# Patient Record
Sex: Male | Born: 1940 | Race: White | Hispanic: No | Marital: Married | Smoking: Former smoker
Health system: Southern US, Community
[De-identification: ages and names within clinical notes are randomized; demographics above are authoritative.]

## PROBLEM LIST (undated history)

## (undated) DIAGNOSIS — K219 Gastro-esophageal reflux disease without esophagitis: Secondary | ICD-10-CM

## (undated) DIAGNOSIS — M25511 Pain in right shoulder: Secondary | ICD-10-CM

## (undated) DIAGNOSIS — Z87442 Personal history of urinary calculi: Secondary | ICD-10-CM

## (undated) DIAGNOSIS — M1990 Unspecified osteoarthritis, unspecified site: Secondary | ICD-10-CM

## (undated) DIAGNOSIS — N400 Enlarged prostate without lower urinary tract symptoms: Secondary | ICD-10-CM

## (undated) DIAGNOSIS — M25512 Pain in left shoulder: Secondary | ICD-10-CM

## (undated) DIAGNOSIS — R42 Dizziness and giddiness: Secondary | ICD-10-CM

## (undated) HISTORY — DX: Unspecified osteoarthritis, unspecified site: M19.90

## (undated) HISTORY — PX: CATARACT EXTRACTION, BILATERAL: SHX1313

## (undated) HISTORY — PX: COLONOSCOPY: SHX174

## (undated) HISTORY — DX: Benign prostatic hyperplasia without lower urinary tract symptoms: N40.0

## (undated) HISTORY — DX: Gastro-esophageal reflux disease without esophagitis: K21.9

## (undated) HISTORY — PX: OTHER SURGICAL HISTORY: SHX169

## (undated) HISTORY — DX: Dizziness and giddiness: R42

## (undated) HISTORY — DX: Pain in right shoulder: M25.511

## (undated) HISTORY — DX: Pain in left shoulder: M25.512

---

## 1999-05-17 ENCOUNTER — Encounter: Payer: Self-pay | Admitting: Family Medicine

## 1999-05-17 ENCOUNTER — Ambulatory Visit (HOSPITAL_COMMUNITY): Admission: RE | Admit: 1999-05-17 | Discharge: 1999-05-17 | Payer: Self-pay | Admitting: Family Medicine

## 2000-03-23 ENCOUNTER — Encounter: Admission: RE | Admit: 2000-03-23 | Discharge: 2000-03-23 | Payer: Self-pay | Admitting: Family Medicine

## 2000-03-23 ENCOUNTER — Encounter: Payer: Self-pay | Admitting: Family Medicine

## 2001-02-18 LAB — HM COLONOSCOPY

## 2004-03-06 ENCOUNTER — Encounter: Admission: RE | Admit: 2004-03-06 | Discharge: 2004-03-06 | Payer: Self-pay | Admitting: Family Medicine

## 2005-03-13 ENCOUNTER — Ambulatory Visit: Payer: Self-pay | Admitting: Family Medicine

## 2005-03-21 ENCOUNTER — Ambulatory Visit: Payer: Self-pay

## 2005-03-21 ENCOUNTER — Encounter: Payer: Self-pay | Admitting: Family Medicine

## 2006-07-07 ENCOUNTER — Ambulatory Visit: Payer: Self-pay | Admitting: Family Medicine

## 2006-07-14 ENCOUNTER — Ambulatory Visit: Payer: Self-pay | Admitting: Family Medicine

## 2007-01-05 ENCOUNTER — Ambulatory Visit: Payer: Self-pay | Admitting: Family Medicine

## 2007-02-18 ENCOUNTER — Ambulatory Visit: Payer: Self-pay | Admitting: Family Medicine

## 2007-02-19 LAB — CONVERTED CEMR LAB
BUN: 18 mg/dL (ref 6–23)
Basophils Absolute: 0 10*3/uL (ref 0.0–0.1)
Basophils Relative: 0 % (ref 0.0–1.0)
CO2: 29 meq/L (ref 19–32)
Calcium: 9.4 mg/dL (ref 8.4–10.5)
Chloride: 109 meq/L (ref 96–112)
Creatinine, Ser: 1 mg/dL (ref 0.4–1.5)
Eosinophils Absolute: 0.1 10*3/uL (ref 0.0–0.6)
Eosinophils Relative: 2.3 % (ref 0.0–5.0)
GFR calc Af Amer: 96 mL/min
GFR calc non Af Amer: 79 mL/min
Glucose, Bld: 87 mg/dL (ref 70–99)
HCT: 46.7 % (ref 39.0–52.0)
Hemoglobin: 15.7 g/dL (ref 13.0–17.0)
Lymphocytes Relative: 23.4 % (ref 12.0–46.0)
MCHC: 33.6 g/dL (ref 30.0–36.0)
MCV: 93.8 fL (ref 78.0–100.0)
Monocytes Absolute: 0.4 10*3/uL (ref 0.2–0.7)
Monocytes Relative: 6.6 % (ref 3.0–11.0)
Neutro Abs: 4.5 10*3/uL (ref 1.4–7.7)
Neutrophils Relative %: 67.7 % (ref 43.0–77.0)
Platelets: 259 10*3/uL (ref 150–400)
Potassium: 4.3 meq/L (ref 3.5–5.1)
RBC: 4.98 M/uL (ref 4.22–5.81)
RDW: 12.5 % (ref 11.5–14.6)
Sodium: 144 meq/L (ref 135–145)
WBC: 6.5 10*3/uL (ref 4.5–10.5)

## 2008-01-19 ENCOUNTER — Ambulatory Visit: Payer: Self-pay | Admitting: Family Medicine

## 2008-01-19 DIAGNOSIS — M129 Arthropathy, unspecified: Secondary | ICD-10-CM

## 2008-01-19 DIAGNOSIS — R42 Dizziness and giddiness: Secondary | ICD-10-CM | POA: Insufficient documentation

## 2008-03-01 ENCOUNTER — Ambulatory Visit: Payer: Self-pay | Admitting: Family Medicine

## 2008-03-01 DIAGNOSIS — K219 Gastro-esophageal reflux disease without esophagitis: Secondary | ICD-10-CM | POA: Insufficient documentation

## 2008-03-08 ENCOUNTER — Ambulatory Visit: Payer: Self-pay | Admitting: Family Medicine

## 2008-08-10 ENCOUNTER — Ambulatory Visit: Payer: Self-pay | Admitting: Family Medicine

## 2008-08-10 LAB — CONVERTED CEMR LAB
Bilirubin Urine: NEGATIVE
Blood in Urine, dipstick: NEGATIVE
Glucose, Urine, Semiquant: NEGATIVE
Ketones, urine, test strip: NEGATIVE
Nitrite: NEGATIVE
Protein, U semiquant: NEGATIVE
Specific Gravity, Urine: 1.02
Urobilinogen, UA: 1
WBC Urine, dipstick: NEGATIVE
pH: 7.5

## 2008-08-17 ENCOUNTER — Ambulatory Visit: Payer: Self-pay | Admitting: Family Medicine

## 2008-08-17 DIAGNOSIS — R351 Nocturia: Secondary | ICD-10-CM

## 2008-08-17 DIAGNOSIS — R3915 Urgency of urination: Secondary | ICD-10-CM

## 2008-08-17 DIAGNOSIS — I10 Essential (primary) hypertension: Secondary | ICD-10-CM

## 2008-08-17 DIAGNOSIS — E669 Obesity, unspecified: Secondary | ICD-10-CM

## 2008-08-17 LAB — CONVERTED CEMR LAB
ALT: 25 units/L (ref 0–53)
AST: 22 units/L (ref 0–37)
Albumin: 4 g/dL (ref 3.5–5.2)
Alkaline Phosphatase: 49 units/L (ref 39–117)
BUN: 16 mg/dL (ref 6–23)
Basophils Absolute: 0 10*3/uL (ref 0.0–0.1)
Basophils Relative: 0.2 % (ref 0.0–3.0)
Bilirubin, Direct: 0.1 mg/dL (ref 0.0–0.3)
CO2: 31 meq/L (ref 19–32)
Calcium: 9.3 mg/dL (ref 8.4–10.5)
Chloride: 103 meq/L (ref 96–112)
Cholesterol: 185 mg/dL (ref 0–200)
Creatinine, Ser: 0.8 mg/dL (ref 0.4–1.5)
Eosinophils Absolute: 0.2 10*3/uL (ref 0.0–0.7)
Eosinophils Relative: 2.7 % (ref 0.0–5.0)
GFR calc Af Amer: 124 mL/min
GFR calc non Af Amer: 102 mL/min
Glucose, Bld: 95 mg/dL (ref 70–99)
HCT: 44.6 % (ref 39.0–52.0)
HDL: 40.5 mg/dL (ref >39.0)
Hemoglobin: 15.6 g/dL (ref 13.0–17.0)
LDL Cholesterol: 123 mg/dL — ABNORMAL HIGH (ref 0–99)
Lymphocytes Relative: 18.2 % (ref 12.0–46.0)
MCHC: 35 g/dL (ref 30.0–36.0)
MCV: 94 fL (ref 78.0–100.0)
Monocytes Absolute: 0.4 10*3/uL (ref 0.1–1.0)
Monocytes Relative: 6.2 % (ref 3.0–12.0)
Neutro Abs: 4.3 10*3/uL (ref 1.4–7.7)
Neutrophils Relative %: 72.7 % (ref 43.0–77.0)
PSA: 2.11 ng/mL (ref 0.10–4.00)
Platelets: 239 10*3/uL (ref 150–400)
Potassium: 3.8 meq/L (ref 3.5–5.1)
RBC: 4.75 M/uL (ref 4.22–5.81)
RDW: 11.8 % (ref 11.5–14.6)
Sodium: 142 meq/L (ref 135–145)
TSH: 1.12 microintl units/mL (ref 0.35–5.50)
Total Bilirubin: 0.9 mg/dL (ref 0.3–1.2)
Total CHOL/HDL Ratio: 4.6
Total Protein: 7 g/dL (ref 6.0–8.3)
Triglycerides: 107 mg/dL (ref 0–149)
VLDL: 21 mg/dL (ref 0–40)
WBC: 6 10*3/uL (ref 4.5–10.5)

## 2008-08-23 ENCOUNTER — Telehealth: Payer: Self-pay | Admitting: Family Medicine

## 2008-09-05 ENCOUNTER — Ambulatory Visit: Payer: Self-pay | Admitting: Internal Medicine

## 2008-09-05 ENCOUNTER — Encounter: Payer: Self-pay | Admitting: Family Medicine

## 2009-03-30 ENCOUNTER — Encounter: Payer: Self-pay | Admitting: Family Medicine

## 2009-10-23 ENCOUNTER — Ambulatory Visit: Payer: Self-pay | Admitting: Family Medicine

## 2009-10-30 ENCOUNTER — Ambulatory Visit: Payer: Self-pay | Admitting: Family Medicine

## 2009-10-30 DIAGNOSIS — F528 Other sexual dysfunction not due to a substance or known physiological condition: Secondary | ICD-10-CM

## 2009-10-30 DIAGNOSIS — Z87891 Personal history of nicotine dependence: Secondary | ICD-10-CM

## 2009-11-01 LAB — CONVERTED CEMR LAB
ALT: 25 units/L (ref 0–53)
AST: 25 units/L (ref 0–37)
Albumin: 4.1 g/dL (ref 3.5–5.2)
Alkaline Phosphatase: 44 units/L (ref 39–117)
BUN: 17 mg/dL (ref 6–23)
Basophils Absolute: 0 10*3/uL (ref 0.0–0.1)
Basophils Relative: 0.4 % (ref 0.0–3.0)
Bilirubin Urine: NEGATIVE
Bilirubin, Direct: 0 mg/dL (ref 0.0–0.3)
Blood in Urine, dipstick: NEGATIVE
CO2: 33 meq/L — ABNORMAL HIGH (ref 19–32)
Calcium: 9.4 mg/dL (ref 8.4–10.5)
Chloride: 105 meq/L (ref 96–112)
Cholesterol: 213 mg/dL — ABNORMAL HIGH (ref 0–200)
Creatinine, Ser: 0.9 mg/dL (ref 0.4–1.5)
Direct LDL: 141.2 mg/dL
Eosinophils Absolute: 0.1 10*3/uL (ref 0.0–0.7)
Eosinophils Relative: 1.7 % (ref 0.0–5.0)
GFR calc non Af Amer: 88.8 mL/min (ref >60)
Glucose, Bld: 96 mg/dL (ref 70–99)
Glucose, Urine, Semiquant: NEGATIVE
HCT: 45.1 % (ref 39.0–52.0)
HDL: 62 mg/dL (ref >39.00)
Hemoglobin: 15.1 g/dL (ref 13.0–17.0)
Ketones, urine, test strip: NEGATIVE
Lymphocytes Relative: 18.1 % (ref 12.0–46.0)
Lymphs Abs: 1.2 10*3/uL (ref 0.7–4.0)
MCHC: 33.6 g/dL (ref 30.0–36.0)
MCV: 98.8 fL (ref 78.0–100.0)
Monocytes Absolute: 0.5 10*3/uL (ref 0.1–1.0)
Monocytes Relative: 7.1 % (ref 3.0–12.0)
Neutro Abs: 4.9 10*3/uL (ref 1.4–7.7)
Neutrophils Relative %: 72.7 % (ref 43.0–77.0)
Nitrite: NEGATIVE
PSA: 2.37 ng/mL (ref 0.10–4.00)
Platelets: 184 10*3/uL (ref 150.0–400.0)
Potassium: 4.1 meq/L (ref 3.5–5.1)
Protein, U semiquant: NEGATIVE
RBC: 4.56 M/uL (ref 4.22–5.81)
RDW: 13.1 % (ref 11.5–14.6)
Sodium: 143 meq/L (ref 135–145)
Specific Gravity, Urine: 1.015
TSH: 1.37 microintl units/mL (ref 0.35–5.50)
Total Bilirubin: 1.1 mg/dL (ref 0.3–1.2)
Total CHOL/HDL Ratio: 3
Total Protein: 7.2 g/dL (ref 6.0–8.3)
Triglycerides: 93 mg/dL (ref 0.0–149.0)
Urobilinogen, UA: 1
VLDL: 18.6 mg/dL (ref 0.0–40.0)
WBC Urine, dipstick: NEGATIVE
WBC: 6.7 10*3/uL (ref 4.5–10.5)
pH: 7.5

## 2010-05-14 ENCOUNTER — Encounter: Payer: Self-pay | Admitting: Family Medicine

## 2010-05-15 ENCOUNTER — Ambulatory Visit: Payer: Self-pay | Admitting: Family Medicine

## 2010-05-15 DIAGNOSIS — Z87442 Personal history of urinary calculi: Secondary | ICD-10-CM | POA: Insufficient documentation

## 2010-05-15 LAB — CONVERTED CEMR LAB
Bilirubin Urine: NEGATIVE
Blood in Urine, dipstick: NEGATIVE
Glucose, Urine, Semiquant: NEGATIVE
Ketones, urine, test strip: NEGATIVE
Nitrite: NEGATIVE
Protein, U semiquant: NEGATIVE
Specific Gravity, Urine: 1.02
Urobilinogen, UA: 1
WBC Urine, dipstick: NEGATIVE
pH: 7

## 2010-06-10 ENCOUNTER — Telehealth: Payer: Self-pay | Admitting: Family Medicine

## 2010-08-13 ENCOUNTER — Encounter: Payer: Self-pay | Admitting: Family Medicine

## 2010-10-24 ENCOUNTER — Ambulatory Visit: Payer: Self-pay | Admitting: Family Medicine

## 2010-10-29 ENCOUNTER — Encounter: Payer: Self-pay | Admitting: Family Medicine

## 2010-10-29 DIAGNOSIS — J020 Streptococcal pharyngitis: Secondary | ICD-10-CM

## 2010-10-29 DIAGNOSIS — E291 Testicular hypofunction: Secondary | ICD-10-CM | POA: Insufficient documentation

## 2010-10-29 LAB — CONVERTED CEMR LAB
ALT: 29 units/L (ref 0–53)
Alkaline Phosphatase: 47 units/L (ref 39–117)
BUN: 18 mg/dL (ref 6–23)
Basophils Relative: 0.6 % (ref 0.0–3.0)
Bilirubin, Direct: 0.1 mg/dL (ref 0.0–0.3)
Calcium: 9.3 mg/dL (ref 8.4–10.5)
Cholesterol: 197 mg/dL (ref 0–200)
Creatinine, Ser: 0.8 mg/dL (ref 0.4–1.5)
Eosinophils Relative: 1.2 % (ref 0.0–5.0)
GFR calc non Af Amer: 102.92 mL/min (ref >60.00)
HDL: 49.7 mg/dL (ref >39.00)
Ketones, urine, test strip: NEGATIVE
LDL Cholesterol: 129 mg/dL — ABNORMAL HIGH (ref 0–99)
Lymphocytes Relative: 17.9 % (ref 12.0–46.0)
Monocytes Absolute: 0.5 10*3/uL (ref 0.1–1.0)
Neutrophils Relative %: 73.2 % (ref 43.0–77.0)
Nitrite: NEGATIVE
PSA: 2.07 ng/mL (ref 0.10–4.00)
Platelets: 186 10*3/uL (ref 150.0–400.0)
Protein, U semiquant: NEGATIVE
RBC: 4.59 M/uL (ref 4.22–5.81)
Total Bilirubin: 0.6 mg/dL (ref 0.3–1.2)
Total CHOL/HDL Ratio: 4
Total Protein: 6.5 g/dL (ref 6.0–8.3)
Triglycerides: 93 mg/dL (ref 0.0–149.0)
Urobilinogen, UA: 0.2
VLDL: 18.6 mg/dL (ref 0.0–40.0)
WBC: 6.4 10*3/uL (ref 4.5–10.5)

## 2010-10-31 ENCOUNTER — Ambulatory Visit: Payer: Self-pay | Admitting: Family Medicine

## 2010-11-05 LAB — CONVERTED CEMR LAB: Testosterone: 180.23 ng/dL — ABNORMAL LOW (ref 350.00–890.00)

## 2010-12-10 NOTE — Assessment & Plan Note (Signed)
Summary: KIDNEY STONE? // RS   Vital Signs:  Patient profile:   70 year old male Weight:      219 pounds O2 Sat:      94 % Temp:     98.1 degrees F Pulse rate:   90 / minute Pulse rhythm:   regular BP sitting:   130 / 80  (left arm) Cuff size:   regular  Vitals Entered By: Pura Spice, RN (May 15, 2010 3:56 PM) CC: ? need colonscopy and states had kidney stone 2 wks ago    History of Present Illness: 70 year old male is in full urinalysis following episode of renal colic 2 weeks ago and was instructed to come in for a urinalysis. He is having no pain no dysuria no urgency in doing fine Also note is we need to check on his colonoscopic exam which was done by Dr. Victorino Dike in the past main complaint today is that of arthritis of the right hip   Allergies: No Known Drug Allergies  Past History:  Risk Factors: Smoking Status: quit (10/30/2009) Packs/Day: 0.5 (10/30/2009)  Past Medical History: BPH Dizziness or vertigo renal calculus  Past Surgical History: repair Achilles tendon on the right  Review of Systems      See HPI  The patient denies anorexia, fever, weight loss, weight gain, vision loss, decreased hearing, hoarseness, chest pain, syncope, dyspnea on exertion, peripheral edema, prolonged cough, headaches, hemoptysis, abdominal pain, melena, hematochezia, severe indigestion/heartburn, hematuria, incontinence, genital sores, muscle weakness, suspicious skin lesions, transient blindness, difficulty walking, depression, unusual weight change, abnormal bleeding, enlarged lymph nodes, angioedema, breast masses, and testicular masses.    Physical Exam  General:  Well-developed,well-nourished,in no acute distress; alert,appropriate and cooperative throughout examinationoverweight-appearing.   Lungs:  Normal respiratory effort, chest expands symmetrically. Lungs are clear to auscultation, no crackles or wheezes. Heart:  Normal rate and regular rhythm. S1 and S2 normal  without gallop, murmur, click, rub or other extra sounds. Abdomen:  Bowel sounds positive,abdomen soft and non-tender without masses, organomegaly or hernias noted.no CVA tenderness Msk:  tenderness over the right hip just above the insertion of the Achilles tendon on the right there is increased induration from the surgery no tenderness Extremities:  No clubbing, cyanosis, edema, or deformity noted with normal full range of motion of all joints.     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 1 once daily  Complete Medication List: 1)  Nexium 40 Mg Cpdr (Esomeprazole magnesium) .Marland Kitchen.. 1 once daily 2)  Zipcor 25 Mg  .Marland Kitchen.. 1 qid for arthritic pain 3)  Peridex 0.12 % Soln (Chlorhexidine gluconate) .Marland Kitchen.. 1  tsp qid, rinse and spit out 4)  Hydrocodone-homatropine 5-1.5 Mg/59ml Syrp (Hydrocodone-homatropine) .Marland Kitchen.. 1 or 2 tsp evry 4-6 hrs as needed cough 5)  Diclofenac Sodium 75 Mg Tbec (Diclofenac sodium) .Marland Kitchen.. 1 tablet after meals, for inflammation anrthritis 6)  Rapaflo 8 Mg Caps (Silodosin) .Marland Kitchen.. 1 qd 7)  Phenteramine 3.75  .Marland Kitchen.. 1 qam to decrease appetite  Patient Instructions: 1)  urinalysis negative 2)  The colonoscopic examination 2012 3)  Continue  her regular medications diclofenac for arthritis Prescriptions: PHENTERAMINE 3.75 1 qAM to decrease appetite  #30 x 2   Entered and Authorized by:   Judithann Sheen MD   Signed by:   Judithann Sheen MD on 05/15/2010   Method used:   Print then Give to Patient   RxID:    [id number]   Prevention & Chronic Care Immunizations   Influenza vaccine: Not documented    Tetanus booster: 07/11/2006: Td   Tetanus booster due: 07/11/2016    Pneumococcal vaccine: Pneumovax  (08/17/2008)   Pneumococcal vaccine due: None    H. zoster vaccine: Not documented  Colorectal Screening   Hemoccult: Not documented    Colonoscopy: Normal  (02/18/2001)   Colonoscopy due: 02/2011  Other Screening   PSA: 2.37  (10/23/2009)   PSA due due: 10/23/2010   Smoking status: quit  (10/30/2009)  Lipids   Total Cholesterol: 213  (10/23/2009)   LDL: 123  (08/10/2008)   LDL Direct: 141.2  (10/23/2009)   HDL: 62.00  (10/23/2009)   Triglycerides: 93.0  (10/23/2009)  Hypertension   Last Blood Pressure: 130 / 80  (05/15/2010)   Serum creatinine: 0.9  (10/23/2009)   Serum potassium 4.1  (10/23/2009)  Self-Management Support :    Hypertension self-management support: Not documented    Laboratory Results   Urine Tests    Routine Urinalysis   Color: yellow Appearance: Clear Glucose: negative   (Normal Range: Negative) Bilirubin: negative   (Normal Range: Negative) Ketone: negative   (Normal Range: Negative) Spec. Gravity: 1.020   (Normal Range: 1.003-1.035) Blood: negative   (Normal Range: Negative) pH: 7.0   (Normal Range: 5.0-8.0) Protein: negative   (Normal Range: Negative) Urobilinogen: 1.0   (Normal Range: 0-1) Nitrite: negative   (Normal Range: Negative) Leukocyte Esterace: negative   (Normal Range: Negative)    Comments: Rita Ohara  May 15, 2010 4:48 PM

## 2010-12-10 NOTE — Letter (Signed)
Summary: [facility]  [facility]   Imported By: Maryln Gottron 07/09/2010 15:07:57  _____________________________________________________________________  External Attachment:    Type:   Image     Comment:   External Document

## 2010-12-10 NOTE — Letter (Signed)
Summary: [facility]  [facility]   Imported By: Maryln Gottron 08/21/2010 13:47:43  _____________________________________________________________________  External Attachment:    Type:   Image     Comment:   External Document

## 2010-12-10 NOTE — Progress Notes (Signed)
Summary: colonscopy  Phone Note Call from Patient   Caller: Spouse Call For: Judithann Sheen MD Summary of Call: Requesting order for colonoscopy......Marland KitchenHas and was told that he cannot have one until 2013 due to his insurance coverage. [phone] Initial call taken by: Lynann Beaver CMA,  June 10, 2010 2:55 PM  Follow-up for Phone Call        does not need exam, notified Follow-up by: Judithann Sheen MD,  June 20, 2010 6:49 PM

## 2010-12-12 NOTE — Assessment & Plan Note (Signed)
Summary: cpx/njr   Vital Signs:  Patient profile:   70 year old male Height:      66 inches Weight:      215 pounds BMI:     34.83 O2 Sat:      96 % Temp:     97.6 degrees F Pulse rate:   88 / minute Pulse rhythm:   regular BP sitting:   146 / 90  (left arm) Cuff size:   large  Vitals Entered By: Pura Spice, RN (October 29, 2010 10:06 AM) CC: go over meds discuss labs  Is Patient Diabetic? No    History of Present Illness: repeat BP 90/37 This 70 year old white male from [place] with an American citizen for many years and is now retired from [employer]. He is in today to discuss his medical problems, discussed his laboratory studies and go over his medications and get necessary refill He has a problem with nocturia of 3-10 control for her well with rapaflo but desires to change to Flomax since it is generic he does have some urinary frequency and urgency since his last visit Curtis control with Nexium Continues to have some arthritis and his problem with his Achilles tendon has been resolved with her great by Dr. Lestine Box Patient continues to have problem with weight gain and realizes he needs to lose some weight However in general he feels very well energetic sleeps relatively well Blood pressure is well controlled complaint of sore throat over the past 2-3 days patient has had erectile dysfunction in the past but has increased in severity along with some increased fatigability and in her discussion and decided to check his testosterone level      Allergies: No Known Drug Allergies  Past History:  Past Medical History: Last updated: 05/15/2010 BPH Dizziness or vertigo renal calculus  Past Surgical History: Last updated: 05/15/2010 repair Achilles tendon on the right  Risk Factors: Smoking Status: quit (10/30/2009) Packs/Day: 0.5 (10/30/2009)  Review of Systems  The patient denies anorexia, fever, weight loss, weight gain, vision loss, decreased  hearing, hoarseness, chest pain, syncope, dyspnea on exertion, peripheral edema, prolonged cough, headaches, hemoptysis, abdominal pain, melena, hematochezia, severe indigestion/heartburn, hematuria, incontinence, genital sores, muscle weakness, suspicious skin lesions, transient blindness, difficulty walking, depression, unusual weight change, abnormal bleeding, enlarged lymph nodes, angioedema, breast masses, and testicular masses.   Eyes:  Denies blurring, discharge, double vision, eye irritation, eye pain, halos, itching, light sensitivity, red eye, vision loss-1 eye, and vision loss-both eyes. ENT:  Denies decreased hearing, difficulty swallowing, ear discharge, earache, hoarseness, nasal congestion, nosebleeds, postnasal drainage, ringing in ears, sinus pressure, and sore throat. CV:  Denies bluish discoloration of lips or nails, chest pain or discomfort, difficulty breathing at night, difficulty breathing while lying down, fainting, fatigue, leg cramps with exertion, lightheadness, near fainting, palpitations, shortness of breath with exertion, swelling of feet, swelling of hands, and weight gain. Resp:  Denies chest discomfort, chest pain with inspiration, cough, coughing up blood, excessive snoring, hypersomnolence, morning headaches, pleuritic, shortness of breath, sputum productive, and wheezing. GI:  Complains of diarrhea and indigestion; ged afyer over eating, also indigstion afterovereating and spicey food. GU:  See HPI; Denies decreased libido, discharge, dysuria, erectile dysfunction, genital sores, hematuria, incontinence, nocturia, urinary frequency, and urinary hesitancy. Neuro:  See HPI; Denies brief paralysis, difficulty with concentration, disturbances in coordination, falling down, headaches, inability to speak, memory loss, numbness, poor balance, seizures, sensation of room spinning, tingling, tremors, visual disturbances, and weakness. Psych:  Denies alternate hallucination (  auditory/visual), anxiety, depression, easily angered, easily tearful, irritability, mental problems, panic attacks, sense of great danger, suicidal thoughts/plans, thoughts of violence, unusual visions or sounds, and thoughts /plans of harming others.  Physical Exam  General:  Well-developed,well-nourished,in no acute distress; alert,appropriate and cooperative throughout examinationoverweight-appearing.   Head:  Normocephalic and atraumatic without obvious abnormalities. No apparent alopecia or balding. Eyes:  No corneal or conjunctival inflammation noted. EOMI. Perrla. Funduscopic exam benign, without hemorrhages, exudates or papilledema. Vision grossly normal. Ears:  External ear exam shows no significant lesions or deformities.  Otoscopic examination reveals clear canals, tympanic membranes are intact bilaterally without bulging, retraction, inflammation or discharge. Hearing is grossly normal bilaterally. Nose:  External nasal examination shows no deformity or inflammation. Nasal mucosa are pink and moist without lesions or exudates. Mouth:  Oral mucosa and oropharynx without lesions or exudates.  Teeth in good repair. Neck:  No deformities, masses, or tenderness noted. Chest Wall:  No deformities, masses, tenderness or gynecomastia noted. Breasts:  No masses or gynecomastia noted Lungs:  Normal respiratory effort, chest expands symmetrically. Lungs are clear to auscultation, no crackles or wheezes. Heart:  Normal rate and regular rhythm. S1 and S2 normal without gallop, murmur, click, rub or other extra sounds. Abdomen:  Bowel sounds positive,abdomen soft and non-tender without masses, organomegaly or hernias noted. Rectal:  No external abnormalities noted. Normal sphincter tone. No rectal masses or tenderness. Genitalia:  Testes bilaterally descended without nodularity, tenderness or masses. No scrotal masses or lesions. No penis lesions or urethral discharge. Prostate:  1+ enlarged.     Msk:  well-healed scar above the right ankle of the Achilles tendon minimal stiffness of right ankle Pulses:  R and L carotid,radial,femoral,dorsalis pedis and posterior tibial pulses are full and equal bilaterally Extremities:  No clubbing, cyanosis, edema, or deformity noted with normal full range of motion of all joints.   Neurologic:  No cranial nerve deficits noted. Station and gait are normal. Plantar reflexes are down-going bilaterally. DTRs are symmetrical throughout. Sensory, motor and coordinative functions appear intact. Skin:  Intact without suspicious lesions or rashes Cervical Nodes:  No lymphadenopathy noted Axillary Nodes:  No palpable lymphadenopathy Inguinal Nodes:  No significant adenopathy Psych:  Cognition and judgment appear intact. Alert and cooperative with normal attention span and concentration. No apparent delusions, illusions, hallucinations   Impression & Recommendations:  Problem # 1:  STREP THROAT (ICD-034.0) Assessment New  The following medications were removed from the medication list:    Peridex 0.12 % Soln (Chlorhexidine gluconate) .Marland Kitchen... 1  tsp qid, rinse and spit out His updated medication list for this problem includes:    Diclofenac Sodium 75 Mg Tbec (Diclofenac sodium) .Marland Kitchen... 1 tablet after meals, for inflammation anrthritis    Ciprofloxacin Hcl 500 Mg Tabs (Ciprofloxacin hcl) .Marland Kitchen... 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  Problem # 7:  NOCTURIA (ELF-810.17) Assessment: Unchanged Flomax 4 mg q. daily  Problem #  8:  GERD (ICD-530.81) Assessment: Improved  His updated medication list for this problem includes:    Nexium 40 Mg Cpdr (Esomeprazole magnesium) .Marland Kitchen... 1 once daily  Complete Medication List: 1)  Nexium 40 Mg Cpdr (Esomeprazole magnesium) .Marland Kitchen.. 1 once daily 2)  Zipcor 25 Mg  .Marland Kitchen.. 1 qid for arthritic pain 3)  Diclofenac Sodium 75 Mg Tbec (Diclofenac sodium) .Marland Kitchen.. 1 tablet after meals, for inflammation anrthritis 4)  Rapaflo 8 Mg Caps (Silodosin) .Marland Kitchen.. 1 qd 5)  Ciprofloxacin Hcl 500 Mg Tabs (Ciprofloxacin hcl) .Marland Kitchen.. 1 once daily to prevent diarrhea, if you have diarrhea then take 1 two times a day for  7 days 6)  Prochlorperazine Maleate 10 Mg Tabs (Prochlorperazine maleate) .Marland Kitchen.. 1 stat then 1 q6h t0 prevent nausea and vomiting 7)  Flomax 0.4 Mg Caps (Tamsulosin hcl) .Marland Kitchen.. 1 once daily to prevent night time urination 8)  Androgel Pump 20.25 Mg/act (1.62%) Gel (Testosterone) .... Apply to shoulders daily 9)  Vesicare 5 Mg Tabs (Solifenacin succinate) .Marland Kitchen.. 1 once daily to prevent urgency  Other Orders: EKG w/ Interpretation (93000)  Patient Instructions: 1)  in general he is doing well 2)  You need to lose weight. Consider a lower calorie diet and regular exercise.  3)  have checked test postural level 4)   and presented it is low we'll treat you with a digital blockRefill of the medication Prescriptions: ANDROGEL PUMP 20.25 MG/ACT (1.62%) GEL (TESTOSTERONE) apply to shoulders daily  #5 gm x 5   Entered and Authorized by:   Judithann Sheen MD   Signed by:   Judithann Sheen MD on 10/29/2010   Method used:   Print then Give to Patient   RxID:   [id number] FLOMAX 0.4 MG CAPS (TAMSULOSIN HCL) 1 once daily to prevent night time urination  #30 x 11   Entered and Authorized by:   Judithann Sheen MD   Signed by:   Judithann Sheen MD on 10/29/2010   Method used:   Electronically to        [facility]  [phone]* (retail)       [address] [place], [place]  [address]       Ph: [phone] or [phone]       Fax: [phone]   RxID:   [id number] DICLOFENAC SODIUM 75 MG TBEC (DICLOFENAC SODIUM) 1 tablet after meals, for inflammation anrthritis  #60 x 11   Entered and Authorized by:   Judithann Sheen MD   Signed by:   Judithann Sheen MD on 10/29/2010   Method used:   Electronically to        [facility]  [phone]* (retail)       [address] [place], [place]  [address]       Ph: [phone] or [phone]       Fax: [phone]   RxID:   [id number] PROCHLORPERAZINE MALEATE 10 MG TABS (PROCHLORPERAZINE MALEATE) 1 stat then 1 q6h t0 prevent nausea and vomiting  #50 x 1   Entered and Authorized by:   Judithann Sheen MD   Signed by:   Judithann Sheen MD on 10/29/2010   Method used:   Electronically to        [facility]  [phone]* (retail)       [address] [place]  [place], [place]  [address]       Ph: [phone] or [phone]       Fax: [phone]   RxID:   [id number] CIPROFLOXACIN HCL 500 MG TABS (CIPROFLOXACIN HCL) 1 once daily to prevent diarrhea, if you have diarrhea then take 1 two times a day for  7 days  #44 x 0   Entered and Authorized by:   Judithann Sheen MD   Signed by:   Judithann Sheen MD on 10/29/2010   Method used:   Electronically to        [facility]  [phone]* (retail)       [address] [place], [place]  [address]       Ph: [phone] or [phone]       Fax: [phone]   RxID:   [id number]    Orders Added: 1)  EKG w/ Interpretation [93000] 2)  TLB-Testosterone, Total [84403-TESTO] 3)  Prescription Created Electronically [G8553] 4)  Est. Patient Level IV [85462]

## 2011-01-18 ENCOUNTER — Encounter: Payer: Self-pay | Admitting: Family Medicine

## 2011-01-28 ENCOUNTER — Encounter: Payer: Self-pay | Admitting: Family Medicine

## 2011-01-28 ENCOUNTER — Ambulatory Visit (INDEPENDENT_AMBULATORY_CARE_PROVIDER_SITE_OTHER): Payer: Medicare Other | Admitting: Family Medicine

## 2011-01-28 VITALS — BP 122/82 | HR 121 | Temp 98.5°F | Ht 65.0 in | Wt 220.0 lb

## 2011-01-28 DIAGNOSIS — R05 Cough: Secondary | ICD-10-CM

## 2011-01-28 DIAGNOSIS — N419 Inflammatory disease of prostate, unspecified: Secondary | ICD-10-CM

## 2011-01-28 DIAGNOSIS — J40 Bronchitis, not specified as acute or chronic: Secondary | ICD-10-CM

## 2011-01-28 DIAGNOSIS — M1990 Unspecified osteoarthritis, unspecified site: Secondary | ICD-10-CM

## 2011-01-28 DIAGNOSIS — M129 Arthropathy, unspecified: Secondary | ICD-10-CM

## 2011-01-28 DIAGNOSIS — R059 Cough, unspecified: Secondary | ICD-10-CM

## 2011-01-28 DIAGNOSIS — N400 Enlarged prostate without lower urinary tract symptoms: Secondary | ICD-10-CM

## 2011-01-28 LAB — POCT URINALYSIS DIPSTICK
Blood, UA: NEGATIVE
Glucose, UA: NEGATIVE
Nitrite, UA: NEGATIVE
Urobilinogen, UA: 0.2

## 2011-01-28 MED ORDER — METHYLPREDNISOLONE ACETATE 80 MG/ML IJ SUSP
120.0000 mg | Freq: Once | INTRAMUSCULAR | Status: AC
  Administered 2011-01-28: 120 mg via INTRAMUSCULAR

## 2011-01-28 MED ORDER — TRAMADOL HCL 50 MG PO TABS: 50.0000 mg | ORAL_TABLET | Freq: Four times a day (QID) | ORAL | Status: AC | PRN

## 2011-01-28 MED ORDER — LEVOFLOXACIN 500 MG PO TABS: 500.0000 mg | ORAL_TABLET | Freq: Every day | ORAL | Status: AC

## 2011-01-29 LAB — CBC WITH DIFFERENTIAL/PLATELET
Basophils Relative: 0.2 % (ref 0.0–3.0)
Eosinophils Relative: 1.2 % (ref 0.0–5.0)
HCT: 42.5 % (ref 39.0–52.0)
Lymphs Abs: 1.7 10*3/uL (ref 0.7–4.0)
MCV: 96.4 fl (ref 78.0–100.0)
Monocytes Absolute: 0.5 10*3/uL (ref 0.1–1.0)
Monocytes Relative: 6.1 % (ref 3.0–12.0)
RBC: 4.41 Mil/uL (ref 4.22–5.81)
WBC: 9 10*3/uL (ref 4.5–10.5)

## 2011-01-30 ENCOUNTER — Ambulatory Visit (INDEPENDENT_AMBULATORY_CARE_PROVIDER_SITE_OTHER)
Admission: RE | Admit: 2011-01-30 | Discharge: 2011-01-30 | Disposition: A | Discharge status: Home / Self Care | Payer: Medicare Other | Source: Ambulatory Visit | Attending: Family Medicine | Admitting: Family Medicine

## 2011-01-30 DIAGNOSIS — J40 Bronchitis, not specified as acute or chronic: Secondary | ICD-10-CM

## 2011-02-02 ENCOUNTER — Encounter: Payer: Self-pay | Admitting: Family Medicine

## 2011-02-02 NOTE — Progress Notes (Signed)
  Subjective:    Patient ID: Paul Schwartz, male    DOB: [birth year], 70 y.o.   MRN: [id number] This 70 year old male from [place] is in to discuss the fact he has had a cough while in vacation in the coughing is persisted for approximately 1 month nonproductive in fact had bronchitis 07 November 2010 he has continued to have back pain for months despite diclofenac and the pain radiates down her right leg also some muscle spasm His main compliant is that of urinary frequency small amounts of urine as well as decrease in flow and nocturia 3-4 times per night he has been on Rapoflol which has not helped at this time HPI    Review of Systemssee history of present illness    Objective:   Physical Exam the patient is a well-developed well-nourished obese male who does not appear to be any need to stress is not short of breath nor cough and at this time HEENT revealed minimal nasal congestion Lungs minimal rhonchi at the bases minimal expiratory wheeze no dullness to percussion Heart examination no cardiomegaly heart sounds are good without murmurs regular rhythm peripheral pulses and good and equal bilaterally Examination of the back reveals tenderness over the lumbar spine as well as right SI joint as well as slight muscle spasm over the right lumbar region rectal examination reveals prostate to be enlarged x2 boggy and very tender no nodules Operative scar on the right ankle posterior       Assessment & Plan:  Chronic cough to obtain chest x-ray as well as CBC to evaluate infection Lumbosacral arthritis as well as strain to treat with Depo-Medrol 120 mg im,Continue diclofenac b.i.d. Prostatitis to treat with Cipro 500 mg b.i.d. And return for examination

## 2011-02-02 NOTE — Patient Instructions (Signed)
A fan the reason for your urinary frequency change in flow and increased nocturia D2 enlarged prostate as well as evidence of prostatitis Since you have had bronchitis for some time which is clear except for the cough will get chest x-ray as well as a complete blood count Back pain persist with arthritis and have given you Depo-Medrol 120 mg IM continue the diclofenac

## 2011-02-18 ENCOUNTER — Ambulatory Visit (INDEPENDENT_AMBULATORY_CARE_PROVIDER_SITE_OTHER): Payer: Medicare Other | Admitting: Family Medicine

## 2011-02-18 VITALS — BP 130/76 | HR 107 | Temp 98.0°F | Ht 66.0 in | Wt 218.0 lb

## 2011-02-18 DIAGNOSIS — M545 Low back pain: Secondary | ICD-10-CM

## 2011-02-18 DIAGNOSIS — M129 Arthropathy, unspecified: Secondary | ICD-10-CM

## 2011-02-18 DIAGNOSIS — M1990 Unspecified osteoarthritis, unspecified site: Secondary | ICD-10-CM

## 2011-02-18 DIAGNOSIS — N419 Inflammatory disease of prostate, unspecified: Secondary | ICD-10-CM

## 2011-02-18 MED ORDER — LEVOFLOXACIN 500 MG PO TABS: 500.0000 mg | ORAL_TABLET | Freq: Every day | ORAL | Status: AC

## 2011-02-18 NOTE — Patient Instructions (Signed)
You continue to have slight residual of prostatits Refilled levaquin 500 mg each day Continue rapaflo Restart diclofenac

## 2011-02-18 NOTE — Progress Notes (Signed)
  Subjective:    Patient ID: Paul Schwartz, male    DOB: [birth year], 70 y.o.   MRN: [id number] This 70 year old [place] is in for followup for recent treatment of prostatitis as well as low back pain He is greatly improved has less urinary symptoms and has changed his medications except for rapaflo Continues to have slight low back pain. No other complaint HPI    Review of Systemssee history of present illness    Objective:   Physical Exam The patient is well-developed well-nourished man who appears to be in no distress is cooperative and pleasant Examination heart and lungs normal, no peripheral edema with Examination of the prostate reveals to be 1+ enlarged with minimal tenderness persisting       Assessment & Plan:  Acute prostatitis has greatly improved but continues to have evidence of persisting prostatitis and will refill his Levaquin Restart diclofenac 75 mg b.i.d. For arthritis and low back pain  Urinary frequency and urgency have improved continue Rapaflo

## 2011-04-08 ENCOUNTER — Encounter: Payer: Self-pay | Admitting: Gastroenterology

## 2011-04-08 ENCOUNTER — Ambulatory Visit (AMBULATORY_SURGERY_CENTER): Payer: Medicare Other

## 2011-04-08 VITALS — Ht 66.0 in | Wt 215.3 lb

## 2011-04-08 DIAGNOSIS — Z1211 Encounter for screening for malignant neoplasm of colon: Secondary | ICD-10-CM

## 2011-04-08 MED ORDER — PEG-KCL-NACL-NASULF-NA ASC-C 100 G PO SOLR: 1.0000 | Freq: Once | ORAL | Status: AC

## 2011-04-09 ENCOUNTER — Ambulatory Visit (INDEPENDENT_AMBULATORY_CARE_PROVIDER_SITE_OTHER): Payer: Medicare Other | Admitting: Family Medicine

## 2011-04-09 ENCOUNTER — Encounter: Payer: Self-pay | Admitting: Family Medicine

## 2011-04-09 VITALS — BP 122/78 | HR 107 | Temp 97.7°F | Wt 217.0 lb

## 2011-04-09 DIAGNOSIS — M67919 Unspecified disorder of synovium and tendon, unspecified shoulder: Secondary | ICD-10-CM

## 2011-04-09 DIAGNOSIS — M7551 Bursitis of right shoulder: Secondary | ICD-10-CM

## 2011-04-09 DIAGNOSIS — M7552 Bursitis of left shoulder: Secondary | ICD-10-CM

## 2011-04-09 MED ORDER — PREDNISONE 20 MG PO TABS: ORAL_TABLET | ORAL | Status: DC

## 2011-04-14 ENCOUNTER — Ambulatory Visit (AMBULATORY_SURGERY_CENTER): Payer: Medicare Other | Admitting: Gastroenterology

## 2011-04-14 ENCOUNTER — Encounter: Payer: Self-pay | Admitting: Gastroenterology

## 2011-04-14 VITALS — BP 158/104 | HR 75 | Resp 17 | Ht 65.0 in | Wt 202.0 lb

## 2011-04-14 DIAGNOSIS — D126 Benign neoplasm of colon, unspecified: Secondary | ICD-10-CM

## 2011-04-14 DIAGNOSIS — Z1211 Encounter for screening for malignant neoplasm of colon: Secondary | ICD-10-CM

## 2011-04-14 MED ORDER — SODIUM CHLORIDE 0.9 % IV SOLN: 500.0000 mL | INTRAVENOUS | Status: DC

## 2011-04-14 NOTE — Patient Instructions (Signed)
DISCHARGE( GREEN& BLUE SHEETS) REVIEWED WITH & GIVEN TO CAREPARTNER. INFORMATION ON POLYPS GIVEN TO CAREPARTNER.

## 2011-04-15 ENCOUNTER — Telehealth: Payer: Self-pay | Admitting: *Deleted

## 2011-04-15 NOTE — Telephone Encounter (Signed)

## 2011-04-20 MED ORDER — METHYLPREDNISOLONE ACETATE 80 MG/ML IJ SUSP: 120.0000 mg | Freq: Once | INTRAMUSCULAR | Status: DC

## 2011-04-20 NOTE — Patient Instructions (Signed)
You have a subdeltoid bursitis in both shoulders which have injected Depo-Medrol with 1% lidocaine and have prescribed prednisone 20 mg decrease in dosage until finished Continue Nexium for her

## 2011-04-20 NOTE — Progress Notes (Signed)
  Subjective:    Patient ID: Paul Schwartz, male    DOB: [birth year], 70 y.o.   MRN: [id number] This 70 year old [place] male is in complaint of pain in both shoulders which hurts on movement as well as keeping him awake at night he's had this previously and treated with diclofenac which did not help him at this time HPI    Review of Systems  Constitutional: Negative.   HENT: Negative.   Eyes: Negative.   Respiratory: Negative.   Cardiovascular: Negative.   Gastrointestinal:       GERD controlled with PPI  Genitourinary:       Nocturia  Musculoskeletal:       Pain both shoulders  Skin: Negative.   Neurological: Negative.   Hematological: Negative.   Psychiatric/Behavioral: Negative.         Objective:   Physical Exam the patient is well-developed well-nourished male who is complaining of pain in both shoulders on elevation arms Examination her shoulder revealed tenderness over both subdeltoid bursa was especially the right Heart and lungs no change       Assessment & Plan:  Acute subdeltoid bursitis bilaterally treated with injection of Depo-Medrol 120 mg +1% lidocaine as well as for treatment of prednisone on decreasing dosage

## 2011-04-21 ENCOUNTER — Encounter: Payer: Self-pay | Admitting: Gastroenterology

## 2011-06-03 ENCOUNTER — Ambulatory Visit (INDEPENDENT_AMBULATORY_CARE_PROVIDER_SITE_OTHER): Payer: Medicare Other | Admitting: Family Medicine

## 2011-06-03 ENCOUNTER — Encounter: Payer: Self-pay | Admitting: Family Medicine

## 2011-06-03 VITALS — BP 128/72 | HR 93 | Temp 98.6°F | Wt 214.0 lb

## 2011-06-03 DIAGNOSIS — M719 Bursopathy, unspecified: Secondary | ICD-10-CM

## 2011-06-03 DIAGNOSIS — M7551 Bursitis of right shoulder: Secondary | ICD-10-CM

## 2011-06-03 DIAGNOSIS — N318 Other neuromuscular dysfunction of bladder: Secondary | ICD-10-CM

## 2011-06-03 DIAGNOSIS — N400 Enlarged prostate without lower urinary tract symptoms: Secondary | ICD-10-CM

## 2011-06-03 DIAGNOSIS — N3281 Overactive bladder: Secondary | ICD-10-CM

## 2011-06-03 DIAGNOSIS — N529 Male erectile dysfunction, unspecified: Secondary | ICD-10-CM

## 2011-06-03 MED ORDER — TADALAFIL 5 MG PO TABS
5.0000 mg | ORAL_TABLET | Freq: Every day | ORAL | Status: DC | PRN
Start: 2011-06-03 — End: 2013-12-20

## 2011-06-03 MED ORDER — SOLIFENACIN SUCCINATE 10 MG PO TABS: 5.0000 mg | ORAL_TABLET | Freq: Every day | ORAL | Status: DC

## 2011-06-03 MED ORDER — LEVOFLOXACIN 500 MG PO TABS: 500.0000 mg | ORAL_TABLET | Freq: Every day | ORAL | Status: AC

## 2011-06-03 MED ORDER — PHENTERMINE HCL 37.5 MG PO CAPS: 37.5000 mg | ORAL_CAPSULE | ORAL | Status: AC

## 2011-06-03 NOTE — Patient Instructions (Addendum)
You have acromioclavicular bursitis right shoulder I injected lidocaine and Depo-Medrol in the bursa attending to take the diclofenac that you have at home twice daily With Cialis I will give you a prescription that they were filled you a one-month free [facility] [facility] Will prescribe phentermine 37.5 mg each am to decrease appetite Prostate exam  Reveals gland has not increased in size TAKE Vesicare 5 mg each day prevent urinary urgency

## 2011-06-09 ENCOUNTER — Ambulatory Visit (INDEPENDENT_AMBULATORY_CARE_PROVIDER_SITE_OTHER)
Admission: RE | Admit: 2011-06-09 | Discharge: 2011-06-09 | Disposition: A | Discharge status: Home / Self Care | Payer: Medicare Other | Source: Ambulatory Visit | Attending: Family Medicine | Admitting: Family Medicine

## 2011-06-09 DIAGNOSIS — M7551 Bursitis of right shoulder: Secondary | ICD-10-CM

## 2011-06-09 DIAGNOSIS — M719 Bursopathy, unspecified: Secondary | ICD-10-CM

## 2011-07-07 NOTE — Progress Notes (Signed)
  Subjective:    Patient ID: Jola Babinski, male    DOB: Feb 07, 1941, 70 y.o.   MRN: [id number] This 70 year old white married male has had problem with bursitis bilaterally but recently has been more severe in the right shoulder with limitation of hyperextension of the arm resulting in pain also has pain during the night. He relates he has nocturia x2 has some urinary urgency No other symptoms on systems review   HPI    Review of Systems see history of present illness     Objective:   Physical Exam patient is a well-built well-nourished male in no distress Examination of the right shoulder revealed marked tenderness of the acromioclavicular bursal with pain also will posterior movement and elevation considerable pain Also examination left shoulder has some tenderness over the a.c. Bursal Examination the prostate reveals very minimal enlargement no tenderness no nodules        Assessment & Plan:  Acromioclavicular bursitis right shoulder refire with x-ray revealing degenerative disease Treat with injection of Depo-Medrol plus lidocaine also continue diclofenac to 5 mg twice a day Nocturia and urinary urgency continue this he care plus Flomax Tramadol for pain Referral to Dr. Valma Cava

## 2011-10-20 ENCOUNTER — Ambulatory Visit (INDEPENDENT_AMBULATORY_CARE_PROVIDER_SITE_OTHER): Payer: Medicare Other

## 2011-10-20 DIAGNOSIS — Z23 Encounter for immunization: Secondary | ICD-10-CM

## 2012-01-06 ENCOUNTER — Encounter: Payer: Medicare Other | Admitting: Internal Medicine

## 2012-01-15 ENCOUNTER — Ambulatory Visit (INDEPENDENT_AMBULATORY_CARE_PROVIDER_SITE_OTHER): Payer: Medicare Other | Admitting: Internal Medicine

## 2012-01-15 ENCOUNTER — Encounter: Payer: Self-pay | Admitting: Internal Medicine

## 2012-01-15 VITALS — BP 120/82 | HR 90 | Temp 98.0°F | Resp 18 | Ht 67.0 in | Wt 211.0 lb

## 2012-01-15 DIAGNOSIS — M129 Arthropathy, unspecified: Secondary | ICD-10-CM

## 2012-01-15 DIAGNOSIS — Z136 Encounter for screening for cardiovascular disorders: Secondary | ICD-10-CM

## 2012-01-15 DIAGNOSIS — I10 Essential (primary) hypertension: Secondary | ICD-10-CM

## 2012-01-15 DIAGNOSIS — E291 Testicular hypofunction: Secondary | ICD-10-CM

## 2012-01-15 DIAGNOSIS — Z86010 Personal history of colonic polyps: Secondary | ICD-10-CM | POA: Insufficient documentation

## 2012-01-15 DIAGNOSIS — Z0000 Encounter for general adult medical examination without abnormal findings: Secondary | ICD-10-CM

## 2012-01-15 DIAGNOSIS — E669 Obesity, unspecified: Secondary | ICD-10-CM

## 2012-01-15 LAB — COMPREHENSIVE METABOLIC PANEL
ALT: 27 U/L (ref 0–53)
AST: 26 U/L (ref 0–37)
Albumin: 4.3 g/dL (ref 3.5–5.2)
CO2: 30 mEq/L (ref 19–32)
Calcium: 9.9 mg/dL (ref 8.4–10.5)
Chloride: 104 mEq/L (ref 96–112)
Creatinine, Ser: 0.8 mg/dL (ref 0.4–1.5)
GFR: 99.64 mL/min (ref >60.00)
Potassium: 4.2 mEq/L (ref 3.5–5.1)
Total Protein: 6.7 g/dL (ref 6.0–8.3)

## 2012-01-15 LAB — CBC WITH DIFFERENTIAL/PLATELET
Basophils Absolute: 0 10*3/uL (ref 0.0–0.1)
Eosinophils Absolute: 0.1 10*3/uL (ref 0.0–0.7)
Hemoglobin: 15.6 g/dL (ref 13.0–17.0)
Lymphocytes Relative: 22 % (ref 12.0–46.0)
MCHC: 34.4 g/dL (ref 30.0–36.0)
Monocytes Relative: 7.5 % (ref 3.0–12.0)
Neutrophils Relative %: 68.1 % (ref 43.0–77.0)
Platelets: 198 10*3/uL (ref 150.0–400.0)
RDW: 13.1 % (ref 11.5–14.6)

## 2012-01-15 LAB — LIPID PANEL: HDL: 49.4 mg/dL (ref >39.00)

## 2012-01-15 LAB — TSH: TSH: 1.01 u[IU]/mL (ref 0.35–5.50)

## 2012-01-15 MED ORDER — TADALAFIL 20 MG PO TABS: 10.0000 mg | ORAL_TABLET | ORAL | Status: DC | PRN

## 2012-01-15 NOTE — Patient Instructions (Signed)
You need to lose weight.  Consider a lower calorie diet and regular exercise.  Limit your sodium (Salt) intake  Avoids foods high in acid such as tomatoes citrus juices, and spicy foods.  Avoid eating within two hours of lying down or before exercising.  Do not overheat.  Try smaller more frequent meals.  If symptoms persist, elevate the head of her bed 12 inches while sleeping.  Orthopedic followup  Return in one year for follow-up

## 2012-01-15 NOTE — Progress Notes (Signed)
Subjective:    Patient ID: Paul Schwartz, male    DOB: 1941-05-27, 71 y.o.   MRN: [id number]  HPI  71 year old patient who is seen today for a wellness exam. He is a former patient of Dr. Scotty Court. Medical problems include a history of colonic polyps he had a followup colonoscopy in June of last year and he has obesity erectile dysfunction and a history of hypertension. Presently he is not requiring medication. He has a history of osteoarthritis with neck and bilateral shoulder pain. He states that he is undergoing evaluation for a possible cervical radiculopathy and is scheduled for nerve conduction studies soon. He has a history of BPH remote tobacco use and a history of hypogonadism. Medical regimen reviewed. He still complains of urinary frequency and urgency. Apparently a child of VESIcare has not been helpful. He remains on rapaflo and Flomax and still has significant symptoms.  Past Medical History  Diagnosis Date  . BPH (benign prostatic hyperplasia)   . Vertigo     dizziness  . Renal calculus   . GERD (gastroesophageal reflux disease)   . Arthritis   . Bilateral shoulder pain     History   Social History  . Marital Status: Married    Spouse Name: N/A    Number of Children: N/A  . Years of Education: N/A   Occupational History  . Not on file.   Social History Main Topics  . Smoking status: Former Smoker    Types: Cigarettes    Quit date: 04/07/1968  . Smokeless tobacco: Never Used  . Alcohol Use: 1.8 oz/week    3 Glasses of wine per week  . Drug Use: No  . Sexually Active: Yes   Other Topics Concern  . Not on file   Social History Narrative  . No narrative on file    Past Surgical History  Procedure Date  . Achilles tendon     surgical repair on the right    Family History  Problem Relation Age of Onset  . Heart disease Father     Allergies  Allergen Reactions  . Plasticized Base ([employer]le[employer] Plasticized) Hives and Rash    Current Outpatient  Prescriptions on File Prior to Visit  Medication Sig Dispense Refill  . aspirin 81 MG tablet Take 81 mg by mouth 2 (two) times daily.        . diclofenac (VOLTAREN) 75 MG EC tablet Take 75 mg by mouth 2 (two) times daily. Take one daily after meals       . Multiple Vitamin (MULTIVITAMIN) tablet Take 1 tablet by mouth daily.        . NON FORMULARY Zipcor 25 mg; q qid for arthritic pain       . prochlorperazine (COMPAZINE) 10 MG tablet Take 10 mg by mouth every 6 (six) hours as needed. 1 stat then 1 q6h to prevent nausea and vomiting       . Silodosin (RAPAFLO) 8 MG CAPS Take by mouth daily.        . Tamsulosin HCl (FLOMAX) 0.4 MG CAPS Take by mouth daily. To prevent night time urination       . traMADol (ULTRAM) 50 MG tablet Take 1 tablet (50 mg total) by mouth every 6 (six) hours as needed for pain. Change orders to 1 tablet qid to prevent cough  120 tablet  5   Current Facility-Administered Medications on File Prior to Visit  Medication Dose Route Frequency Provider Last Rate Last Dose  .  0.9 %  sodium chloride infusion  500 mL Intravenous Continuous Eliezer Bottom., MD,FACG      . methylPREDNISolone acetate (DEPO-MEDROL) injection 120 mg  120 mg Intra-articular Once Damian Leavell., MD        BP 120/82  Pulse 90  Temp(Src) 98 F (36.7 C) (Oral)  Resp 18  Ht 5\' 7"  (1.702 m)  Wt 211 lb (95.709 kg)  BMI 33.05 kg/m2  SpO2 98%  1. Risk factors, based on past  M,S,F history- cardiovascular risk factors include a history of hypertension. Presently controlled without medications  2.  Physical activities: No acute restrictions does have some significant arthritis but this involves primarily shoulder and neck region  3.  Depression/mood: No history depression or mood disorder  4.  Hearing: No deficits. Has seen Dr. Lazarus Salines in the past  5.  ADL's: Independent in all aspects of daily living 6.  Fall risk: Low  7.  Home safety: No problems identified  8.  Height weight,  and visual acuity; height and weight stable no visual deficits  9.  Counseling: We'll regular exercise modest weight loss encouraged  10. Lab orders based on risk factors: Laboratory profile will be reviewed  11. Referral : Followup orthopedic  12. Care plan: Orthopedic followup followup nerve conduction studies followup colonoscopy in 4 years  13. Cognitive assessment: Alert and oriented normal affect. No cognitive dysfunction        Review of Systems  Constitutional: Negative for fever, chills, appetite change and fatigue.  HENT: Negative for hearing loss, ear pain, congestion, sore throat, trouble swallowing, neck stiffness, dental problem, voice change and tinnitus.   Eyes: Negative for pain, discharge and visual disturbance.  Respiratory: Negative for cough, chest tightness, wheezing and stridor.   Cardiovascular: Negative for chest pain, palpitations and leg swelling.  Gastrointestinal: Negative for nausea, vomiting, abdominal pain, diarrhea, constipation, blood in stool and abdominal distention.  Genitourinary: Positive for urgency and frequency. Negative for hematuria, flank pain, discharge, difficulty urinating and genital sores.  Musculoskeletal: Negative for myalgias, back pain, joint swelling and gait problem. Arthralgias: Shoulder and neck pain.  Skin: Negative for rash.  Neurological: Negative for dizziness, syncope, speech difficulty, weakness, numbness and headaches.  Hematological: Negative for adenopathy. Does not bruise/bleed easily.  Psychiatric/Behavioral: Negative for behavioral problems and dysphoric mood. The patient is not nervous/anxious.        Objective:   Physical Exam  Constitutional: He appears well-developed and well-nourished.       Overweight. No acute distress. Blood pressure 120/82  HENT:  Head: Normocephalic and atraumatic.  Right Ear: External ear normal.  Left Ear: External ear normal.  Nose: Nose normal.  Mouth/Throat: Oropharynx is  clear and moist.  Eyes: Conjunctivae and EOM are normal. Pupils are equal, round, and reactive to light. No scleral icterus.  Neck: Normal range of motion. Neck supple. No JVD present. No thyromegaly present.  Cardiovascular: Normal rate, regular rhythm and intact distal pulses.  Exam reveals no gallop and no friction rub.   Murmur heard.      Grade 2/6 midsystolic murmur very brief  Diminished left dorsalis pedis pulse  Pulmonary/Chest: Effort normal and breath sounds normal. He exhibits no tenderness.  Abdominal: Soft. Bowel sounds are normal. He exhibits no distension and no mass. There is no tenderness.  Genitourinary: Penis normal.       Prostate +2 enlarged  Musculoskeletal: Normal range of motion. He exhibits no edema and no tenderness.  Lymphadenopathy:  He has no cervical adenopathy.  Neurological: He is alert. He has normal reflexes. No cranial nerve deficit. Coordination normal.  Skin: Skin is warm and dry. No rash noted.  Psychiatric: He has a normal mood and affect. His behavior is normal.          Assessment & Plan:   Exogenous obesity History of hypertension. Currently stable off medication Osteo-arthritis. Followup orthopedic. Followup nerve conduction studies BPH History of colonic polyps followup colonoscopy in 4 years  Medical regimen unchanged

## 2012-01-19 ENCOUNTER — Telehealth: Payer: Self-pay | Admitting: Internal Medicine

## 2012-01-19 NOTE — Telephone Encounter (Signed)
Please advise 

## 2012-01-19 NOTE — Telephone Encounter (Signed)
Pt called req to get lab results for cpx last week. Pls call

## 2012-01-19 NOTE — Telephone Encounter (Signed)
Please call/notify patient that lab/test/procedure is normal   chol 192

## 2012-01-19 NOTE — Telephone Encounter (Signed)
Spoke with wife - informed labs normal

## 2012-09-03 ENCOUNTER — Ambulatory Visit (INDEPENDENT_AMBULATORY_CARE_PROVIDER_SITE_OTHER): Payer: Medicare Other

## 2012-09-03 ENCOUNTER — Ambulatory Visit (INDEPENDENT_AMBULATORY_CARE_PROVIDER_SITE_OTHER): Payer: Medicare Other | Admitting: *Deleted

## 2012-09-03 DIAGNOSIS — Z23 Encounter for immunization: Secondary | ICD-10-CM

## 2012-09-03 DIAGNOSIS — Z0000 Encounter for general adult medical examination without abnormal findings: Secondary | ICD-10-CM

## 2013-03-03 ENCOUNTER — Ambulatory Visit (INDEPENDENT_AMBULATORY_CARE_PROVIDER_SITE_OTHER): Payer: Medicare Other | Admitting: Internal Medicine

## 2013-03-03 ENCOUNTER — Encounter: Payer: Self-pay | Admitting: Internal Medicine

## 2013-03-03 VITALS — BP 136/80 | HR 92 | Temp 98.0°F | Resp 20 | Wt 205.0 lb

## 2013-03-03 DIAGNOSIS — H6122 Impacted cerumen, left ear: Secondary | ICD-10-CM

## 2013-03-03 DIAGNOSIS — H6120 Impacted cerumen, unspecified ear: Secondary | ICD-10-CM

## 2013-03-03 DIAGNOSIS — H6123 Impacted cerumen, bilateral: Secondary | ICD-10-CM

## 2013-03-03 DIAGNOSIS — I10 Essential (primary) hypertension: Secondary | ICD-10-CM

## 2013-03-03 MED ORDER — SILDENAFIL CITRATE 100 MG PO TABS: 50.0000 mg | ORAL_TABLET | Freq: Every day | ORAL | Status: DC | PRN

## 2013-03-03 NOTE — Progress Notes (Signed)
  Subjective:    Patient ID: Paul Schwartz, male    DOB: [birth year], 72 y.o.   MRN: [id number]  HPI  72 year old patient who has a history of treated hypertension. He is complaining of diminished auditory acuity from the left ear.  Wt Readings from Last 3 Encounters:  03/03/13 205 lb (92.987 kg)  01/15/12 211 lb (95.709 kg)  06/03/11 214 lb (97.07 kg)    Review of Systems  HENT: Positive for hearing loss. Negative for ear pain, tinnitus and ear discharge.        Objective:   Physical Exam  Constitutional: He appears well-developed and well-nourished. No distress.  HENT:  Left cerumen impaction          Assessment & Plan:    Cerumen  impaction AS-  left canal irrigated until clear

## 2013-03-03 NOTE — Patient Instructions (Signed)
Limit your sodium (Salt) intake    It is important that you exercise regularly, at least 20 minutes 3 to 4 times per week.  If you develop chest pain or shortness of breath seek  medical attention.  You need to lose weight.  Consider a lower calorie diet and regular exercise.  Please schedule annual exam

## 2013-09-02 ENCOUNTER — Other Ambulatory Visit (INDEPENDENT_AMBULATORY_CARE_PROVIDER_SITE_OTHER): Payer: Medicare Other

## 2013-09-02 DIAGNOSIS — R351 Nocturia: Secondary | ICD-10-CM

## 2013-09-02 DIAGNOSIS — Z0000 Encounter for general adult medical examination without abnormal findings: Secondary | ICD-10-CM

## 2013-09-02 DIAGNOSIS — I10 Essential (primary) hypertension: Secondary | ICD-10-CM

## 2013-09-02 LAB — HEPATIC FUNCTION PANEL
ALT: 25 U/L (ref 0–53)
Albumin: 4.2 g/dL (ref 3.5–5.2)
Alkaline Phosphatase: 51 U/L (ref 39–117)
Bilirubin, Direct: 0.1 mg/dL (ref 0.0–0.3)
Total Protein: 6.8 g/dL (ref 6.0–8.3)

## 2013-09-02 LAB — LIPID PANEL
HDL: 55.1 mg/dL (ref >39.00)
Total CHOL/HDL Ratio: 4
VLDL: 19 mg/dL (ref 0.0–40.0)

## 2013-09-02 LAB — CBC WITH DIFFERENTIAL/PLATELET
Basophils Relative: 0.4 % (ref 0.0–3.0)
Eosinophils Absolute: 0.1 10*3/uL (ref 0.0–0.7)
Eosinophils Relative: 2.1 % (ref 0.0–5.0)
Hemoglobin: 15.2 g/dL (ref 13.0–17.0)
MCHC: 34.7 g/dL (ref 30.0–36.0)
MCV: 92.6 fl (ref 78.0–100.0)
Monocytes Absolute: 0.4 10*3/uL (ref 0.1–1.0)
Neutro Abs: 4.4 10*3/uL (ref 1.4–7.7)
Neutrophils Relative %: 72.6 % (ref 43.0–77.0)
RBC: 4.73 Mil/uL (ref 4.22–5.81)
WBC: 6.1 10*3/uL (ref 4.5–10.5)

## 2013-09-02 LAB — BASIC METABOLIC PANEL
CO2: 32 mEq/L (ref 19–32)
Chloride: 104 mEq/L (ref 96–112)
Creatinine, Ser: 0.8 mg/dL (ref 0.4–1.5)
Potassium: 4.4 mEq/L (ref 3.5–5.1)
Sodium: 141 mEq/L (ref 135–145)

## 2013-09-02 LAB — POCT URINALYSIS DIPSTICK
Bilirubin, UA: NEGATIVE
Blood, UA: NEGATIVE
Ketones, UA: NEGATIVE
Protein, UA: NEGATIVE
pH, UA: 7

## 2013-09-02 LAB — PSA: PSA: 4.26 ng/mL — ABNORMAL HIGH (ref 0.10–4.00)

## 2013-09-02 LAB — LDL CHOLESTEROL, DIRECT: Direct LDL: 138.9 mg/dL

## 2013-09-09 ENCOUNTER — Ambulatory Visit (INDEPENDENT_AMBULATORY_CARE_PROVIDER_SITE_OTHER): Payer: Medicare Other | Admitting: Internal Medicine

## 2013-09-09 ENCOUNTER — Encounter: Payer: Self-pay | Admitting: Internal Medicine

## 2013-09-09 VITALS — BP 120/88 | HR 90 | Temp 97.5°F | Ht 67.0 in | Wt 200.0 lb

## 2013-09-09 DIAGNOSIS — K219 Gastro-esophageal reflux disease without esophagitis: Secondary | ICD-10-CM

## 2013-09-09 DIAGNOSIS — Z86010 Personal history of colonic polyps: Secondary | ICD-10-CM

## 2013-09-09 DIAGNOSIS — M129 Arthropathy, unspecified: Secondary | ICD-10-CM

## 2013-09-09 DIAGNOSIS — Z23 Encounter for immunization: Secondary | ICD-10-CM

## 2013-09-09 DIAGNOSIS — I10 Essential (primary) hypertension: Secondary | ICD-10-CM

## 2013-09-09 DIAGNOSIS — E291 Testicular hypofunction: Secondary | ICD-10-CM

## 2013-09-09 DIAGNOSIS — E669 Obesity, unspecified: Secondary | ICD-10-CM

## 2013-09-09 MED ORDER — TADALAFIL 10 MG PO TABS: 10.0000 mg | ORAL_TABLET | Freq: Every day | ORAL | Status: DC | PRN

## 2013-09-09 MED ORDER — DICLOFENAC SODIUM 75 MG PO TBEC: 75.0000 mg | DELAYED_RELEASE_TABLET | Freq: Two times a day (BID) | ORAL | Status: DC

## 2013-09-09 MED ORDER — TAMSULOSIN HCL 0.4 MG PO CAPS: 0.4000 mg | ORAL_CAPSULE | Freq: Every day | ORAL | Status: DC

## 2013-09-09 MED ORDER — LORAZEPAM 0.5 MG PO TABS: 0.5000 mg | ORAL_TABLET | Freq: Three times a day (TID) | ORAL | Status: DC | PRN

## 2013-09-09 NOTE — Progress Notes (Signed)
Patient ID: Paul Schwartz, male   DOB: [birth year], 72 y.o.   MRN: [id number]  Subjective:    Patient ID: Paul Schwartz, male    DOB: January 21, 1941, 72 y.o.   MRN: [id number]  HPI  72 year old patient who is seen today for a wellness exam. He is a former patient of Dr. Scotty Court. Medical problems include a history of colonic polyps;  he had a followup colonoscopy in June of 2012;   he has obesity erectile dysfunction and a history of hypertension. Presently he is not requiring medication. He has a history of osteoarthritis with neck and bilateral shoulder pain. He states that he is undergoing evaluation for a possible cervical radiculopathy and is scheduled for nerve conduction studies soon. He has a history of BPH remote tobacco use and a history of hypogonadism. Medical regimen reviewed. He still complains of urinary frequency and urgency. Apparently a  Trial  of VESIcare has not been helpful. He remains on rapaflo and Flomax and still has significant symptoms.  Past Medical History  Diagnosis Date  . BPH (benign prostatic hyperplasia)   . Vertigo     dizziness  . Renal calculus   . GERD (gastroesophageal reflux disease)   . Arthritis   . Bilateral shoulder pain     History   Social History  . Marital Status: Married    Spouse Name: N/A    Number of Children: N/A  . Years of Education: N/A   Occupational History  . Not on file.   Social History Main Topics  . Smoking status: Former Smoker    Types: Cigarettes    Quit date: 04/07/1968  . Smokeless tobacco: Never Used  . Alcohol Use: 1.8 oz/week    3 Glasses of wine per week  . Drug Use: No  . Sexual Activity: Yes   Other Topics Concern  . Not on file   Social History Narrative  . No narrative on file    Past Surgical History  Procedure Laterality Date  . Achilles tendon      surgical repair on the right    Family History  Problem Relation Age of Onset  . Heart disease Father     Allergies  Allergen  Reactions  . Plasticized Base [Plastibase] Hives and Rash    Current Outpatient Prescriptions on File Prior to Visit  Medication Sig Dispense Refill  . aspirin 81 MG tablet Take 81 mg by mouth 2 (two) times daily.        . diclofenac (VOLTAREN) 75 MG EC tablet Take 75 mg by mouth 2 (two) times daily. Take one daily after meals       . Multiple Vitamin (MULTIVITAMIN) tablet Take 1 tablet by mouth daily.        . NON FORMULARY Zipcor 25 mg; q qid for arthritic pain       . sildenafil (VIAGRA) 100 MG tablet Take 0.5-1 tablets (50-100 mg total) by mouth daily as needed for erectile dysfunction.  3 tablet  11  . Tamsulosin HCl (FLOMAX) 0.4 MG CAPS Take by mouth daily. To prevent night time urination       . [DISCONTINUED] solifenacin (VESICARE) 10 MG tablet Take 0.5 tablets (5 mg total) by mouth daily.  30 tablet  11   Current Facility-Administered Medications on File Prior to Visit  Medication Dose Route Frequency Provider Last Rate Last Dose  . 0.9 %  sodium chloride infusion  500 mL Intravenous Continuous Meryl Dare, MD      .  methylPREDNISolone acetate (DEPO-MEDROL) injection 120 mg  120 mg Intra-articular Once Damian Leavell., MD        BP 120/88  Pulse 90  Temp(Src) 97.5 F (36.4 C) (Oral)  Ht 5\' 7"  (1.702 m)  Wt 200 lb (90.719 kg)  BMI 31.32 kg/m2  SpO2 97%  1. Risk factors, based on past  M,S,F history- cardiovascular risk factors include a history of hypertension. Presently controlled without medications  2.  Physical activities: No acute restrictions does have some significant arthritis but this involves primarily shoulder and neck region  3.  Depression/mood: No history depression or mood disorder  4.  Hearing: No deficits. Has seen Dr. Lazarus Salines in the past  5.  ADL's: Independent in all aspects of daily living 6.  Fall risk: Low  7.  Home safety: No problems identified  8.  Height weight, and visual acuity; height and weight stable no visual  deficits  9.  Counseling: We'll regular exercise modest weight loss encouraged  10. Lab orders based on risk factors: Laboratory profile will be reviewed  11. Referral : Followup orthopedic  12. Care plan: Orthopedic followup followup nerve conduction studies followup colonoscopy in 4 years  13. Cognitive assessment: Alert and oriented normal affect. No cognitive dysfunction        Review of Systems  Constitutional: Negative for fever, chills, appetite change and fatigue.  HENT: Negative for congestion, dental problem, ear pain, hearing loss, sore throat, tinnitus, trouble swallowing and voice change.   Eyes: Negative for pain, discharge and visual disturbance.  Respiratory: Negative for cough, chest tightness, wheezing and stridor.   Cardiovascular: Negative for chest pain, palpitations and leg swelling.  Gastrointestinal: Negative for nausea, vomiting, abdominal pain, diarrhea, constipation, blood in stool and abdominal distention.  Genitourinary: Positive for urgency and frequency. Negative for hematuria, flank pain, discharge, difficulty urinating and genital sores.  Musculoskeletal: Negative for back pain, gait problem, joint swelling, myalgias and neck stiffness. Arthralgias: Shoulder and neck pain.  Skin: Negative for rash.  Neurological: Negative for dizziness, syncope, speech difficulty, weakness, numbness and headaches.  Hematological: Negative for adenopathy. Does not bruise/bleed easily.  Psychiatric/Behavioral: Negative for behavioral problems and dysphoric mood. The patient is not nervous/anxious.        Objective:   Physical Exam  Constitutional: He appears well-developed and well-nourished.  Overweight. No acute distress. Blood pressure 120/82  HENT:  Head: Normocephalic and atraumatic.  Right Ear: External ear normal.  Left Ear: External ear normal.  Nose: Nose normal.  Mouth/Throat: Oropharynx is clear and moist.  Eyes: Conjunctivae and EOM are normal.  Pupils are equal, round, and reactive to light. No scleral icterus.  Neck: Normal range of motion. Neck supple. No JVD present. No thyromegaly present.  Cardiovascular: Normal rate, regular rhythm and intact distal pulses.  Exam reveals no gallop and no friction rub.   Murmur heard. Grade 2/6 midsystolic murmur very brief  The left posterior tibial and the right dorsalis pedis pulse were full  Pulmonary/Chest: Effort normal and breath sounds normal. He exhibits no tenderness.  Abdominal: Soft. Bowel sounds are normal. He exhibits no distension and no mass. There is no tenderness.  Genitourinary: Penis normal. Guaiac negative stool.  Prostate +3 enlarged  Musculoskeletal: Normal range of motion. He exhibits no edema and no tenderness.  Lymphadenopathy:    He has no cervical adenopathy.  Neurological: He is alert. He has normal reflexes. No cranial nerve deficit. Coordination normal.  Skin: Skin is warm and dry. No rash  noted.  Psychiatric: He has a normal mood and affect. His behavior is normal.          Assessment & Plan:   Exogenous obesity History of hypertension. Currently stable off medication Osteoarthritis. Followup orthopedic. Followup nerve conduction studies BPH /elevated PSA History of colonic polyps followup colonoscopy in 3 years  Medical regimen unchanged

## 2013-09-09 NOTE — Patient Instructions (Signed)
Limit your sodium (Salt) intake    It is important that you exercise regularly, at least 20 minutes 3 to 4 times per week.  If you develop chest pain or shortness of breath seek  medical attention.  Return in 6 months for followup and repeat PSA at that time

## 2013-12-20 ENCOUNTER — Encounter: Payer: Self-pay | Admitting: Internal Medicine

## 2013-12-20 ENCOUNTER — Ambulatory Visit (INDEPENDENT_AMBULATORY_CARE_PROVIDER_SITE_OTHER): Payer: Medicare Other | Admitting: Internal Medicine

## 2013-12-20 VITALS — BP 124/80 | HR 77 | Temp 98.2°F | Resp 20 | Ht 67.0 in | Wt 204.0 lb

## 2013-12-20 DIAGNOSIS — H8110 Benign paroxysmal vertigo, unspecified ear: Secondary | ICD-10-CM

## 2013-12-20 DIAGNOSIS — I10 Essential (primary) hypertension: Secondary | ICD-10-CM

## 2013-12-20 NOTE — Progress Notes (Signed)
Subjective:    Patient ID: Paul Schwartz, male    DOB: 07-09-1941, 73 y.o.   MRN: [id number]  HPI  and 73 year old patient who has a history of treated hypertension. He was stable until 2 days ago when he experienced significant vertigo when he was bent over to tie his shoes. Symptoms were quite severe but lasted the less than 1 minute. Since that time he has had some occasional very mild and very fleeting vertigo when he bends over at the waist or extends his head back.  Past Medical History  Diagnosis Date  . BPH (benign prostatic hyperplasia)   . Vertigo     dizziness  . Renal calculus   . GERD (gastroesophageal reflux disease)   . Arthritis   . Bilateral shoulder pain     History   Social History  . Marital Status: Married    Spouse Name: N/A    Number of Children: N/A  . Years of Education: N/A   Occupational History  . Not on file.   Social History Main Topics  . Smoking status: Former Smoker    Types: Cigarettes    Quit date: 04/07/1968  . Smokeless tobacco: Never Used  . Alcohol Use: 1.8 oz/week    3 Glasses of wine per week  . Drug Use: No  . Sexual Activity: Yes   Other Topics Concern  . Not on file   Social History Narrative  . No narrative on file    Past Surgical History  Procedure Laterality Date  . Achilles tendon      surgical repair on the right    Family History  Problem Relation Age of Onset  . Heart disease Father     Allergies  Allergen Reactions  . Plasticized Base [Plastibase] Hives and Rash    Current Outpatient Prescriptions on File Prior to Visit  Medication Sig Dispense Refill  . aspirin 81 MG tablet Take 81 mg by mouth 2 (two) times daily.        . diclofenac (VOLTAREN) 75 MG EC tablet Take 1 tablet (75 mg total) by mouth 2 (two) times daily. Take one daily after meals  180 tablet  5  . LORazepam (ATIVAN) 0.5 MG tablet Take 1 tablet (0.5 mg total) by mouth 3 (three) times daily as needed for anxiety.  60 tablet  3  .  Multiple Vitamin (MULTIVITAMIN) tablet Take 1 tablet by mouth daily.        . NON FORMULARY Zipcor 25 mg; q qid for arthritic pain       . tadalafil (CIALIS) 10 MG tablet Take 1 tablet (10 mg total) by mouth daily as needed for erectile dysfunction.  10 tablet  6  . tamsulosin (FLOMAX) 0.4 MG CAPS capsule Take 1 capsule (0.4 mg total) by mouth daily. To prevent night time urination  90 capsule  5  . [DISCONTINUED] solifenacin (VESICARE) 10 MG tablet Take 0.5 tablets (5 mg total) by mouth daily.  30 tablet  11   Current Facility-Administered Medications on File Prior to Visit  Medication Dose Route Frequency Provider Last Rate Last Dose  . 0.9 %  sodium chloride infusion  500 mL Intravenous Continuous Ladene Artist, MD      . methylPREDNISolone acetate (DEPO-MEDROL) injection 120 mg  120 mg Intra-articular Once Hoover Browns., MD        BP 124/80  Pulse 77  Temp(Src) 98.2 F (36.8 C) (Oral)  Resp 20  Ht 5\' 7"  (  1.702 m)  Wt 204 lb (92.534 kg)  BMI 31.94 kg/m2  SpO2 96%       Review of Systems  Constitutional: Negative for fever, chills, appetite change and fatigue.  HENT: Negative for congestion, dental problem, ear pain, hearing loss, sore throat, tinnitus, trouble swallowing and voice change.   Eyes: Negative for pain, discharge and visual disturbance.  Respiratory: Negative for cough, chest tightness, wheezing and stridor.   Cardiovascular: Negative for chest pain, palpitations and leg swelling.  Gastrointestinal: Negative for nausea, vomiting, abdominal pain, diarrhea, constipation, blood in stool and abdominal distention.  Genitourinary: Negative for urgency, hematuria, flank pain, discharge, difficulty urinating and genital sores.  Musculoskeletal: Negative for arthralgias, back pain, gait problem, joint swelling, myalgias and neck stiffness.  Skin: Negative for rash.  Neurological: Positive for light-headedness. Negative for dizziness, syncope, speech  difficulty, weakness, numbness and headaches.  Hematological: Negative for adenopathy. Does not bruise/bleed easily.  Psychiatric/Behavioral: Negative for behavioral problems and dysphoric mood. The patient is not nervous/anxious.        Objective:   Physical Exam  Constitutional: He is oriented to person, place, and time. He appears well-developed.  HENT:  Head: Normocephalic.  Right Ear: External ear normal.  Left Ear: External ear normal.  Eyes: Conjunctivae and EOM are normal.  Neck: Normal range of motion.  Cardiovascular: Normal rate and normal heart sounds.   Pulmonary/Chest: Breath sounds normal.  Abdominal: Bowel sounds are normal.  Musculoskeletal: Normal range of motion. He exhibits no edema and no tenderness.  Neurological: He is alert and oriented to person, place, and time. No cranial nerve deficit.  Pupillary responses and extraocular Muscles intact  Normal gait  normal finger to nose testing   Psychiatric: He has a normal mood and affect. His behavior is normal.          Assessment & Plan:   Mild positional vertigo. Largely resolved. We'll continue observation Hypertension well controlled. We'll continue present regimen CPX October as scheduled

## 2013-12-20 NOTE — Progress Notes (Signed)
Pre-visit discussion using our clinic review tool. No additional management support is needed unless otherwise documented below in the visit note.  

## 2013-12-20 NOTE — Patient Instructions (Signed)
Call or return to clinic prn if these symptoms worsen or fail to improve as anticipated. Benign Positional Vertigo Vertigo means you feel like you or your surroundings are moving when they are not. Benign positional vertigo is the most common form of vertigo. Benign means that the cause of your condition is not serious. Benign positional vertigo is more common in older adults. CAUSES  Benign positional vertigo is the result of an upset in the labyrinth system. This is an area in the middle ear that helps control your balance. This may be caused by a viral infection, head injury, or repetitive motion. However, often no specific cause is found. SYMPTOMS  Symptoms of benign positional vertigo occur when you move your head or eyes in different directions. Some of the symptoms may include:  Loss of balance and falls.  Vomiting.  Blurred vision.  Dizziness.  Nausea.  Involuntary eye movements (nystagmus). DIAGNOSIS  Benign positional vertigo is usually diagnosed by physical exam. If the specific cause of your benign positional vertigo is unknown, your caregiver may perform imaging tests, such as magnetic resonance imaging (MRI) or computed tomography (CT). TREATMENT  Your caregiver may recommend movements or procedures to correct the benign positional vertigo. Medicines such as meclizine, benzodiazepines, and medicines for nausea may be used to treat your symptoms. In rare cases, if your symptoms are caused by certain conditions that affect the inner ear, you may need surgery. HOME CARE INSTRUCTIONS   Follow your caregiver's instructions.  Move slowly. Do not make sudden body or head movements.  Avoid driving.  Avoid operating heavy machinery.  Avoid performing any tasks that would be dangerous to you or others during a vertigo episode.  Drink enough fluids to keep your urine clear or pale yellow. SEEK IMMEDIATE MEDICAL CARE IF:   You develop problems with walking, weakness, numbness,  or using your arms, hands, or legs.  You have difficulty speaking.  You develop severe headaches.  Your nausea or vomiting continues or gets worse.  You develop visual changes.  Your family or friends notice any behavioral changes.  Your condition gets worse.  You have a fever.  You develop a stiff neck or sensitivity to light. MAKE SURE YOU:   Understand these instructions.  Will watch your condition.  Will get help right away if you are not doing well or get worse. Document Released: 08/04/2006 Document Revised: 01/19/2012 Document Reviewed: 07/17/2011 [facility] Patient Information 2014 [place].

## 2013-12-21 ENCOUNTER — Telehealth: Payer: Self-pay | Admitting: Internal Medicine

## 2013-12-21 NOTE — Telephone Encounter (Signed)
Relevant patient education mailed to patient.  

## 2014-04-18 ENCOUNTER — Other Ambulatory Visit: Payer: Self-pay | Admitting: Dermatology

## 2014-07-24 ENCOUNTER — Encounter: Payer: Self-pay | Admitting: Internal Medicine

## 2014-07-24 ENCOUNTER — Ambulatory Visit (INDEPENDENT_AMBULATORY_CARE_PROVIDER_SITE_OTHER): Payer: Medicare Other | Admitting: Internal Medicine

## 2014-07-24 VITALS — BP 146/90 | HR 73 | Temp 97.7°F | Resp 20 | Ht 67.0 in | Wt 195.0 lb

## 2014-07-24 DIAGNOSIS — I10 Essential (primary) hypertension: Secondary | ICD-10-CM

## 2014-07-24 DIAGNOSIS — M129 Arthropathy, unspecified: Secondary | ICD-10-CM

## 2014-07-24 DIAGNOSIS — M545 Low back pain, unspecified: Secondary | ICD-10-CM

## 2014-07-24 DIAGNOSIS — Z87442 Personal history of urinary calculi: Secondary | ICD-10-CM

## 2014-07-24 MED ORDER — CYCLOBENZAPRINE HCL 5 MG PO TABS: 5.0000 mg | ORAL_TABLET | Freq: Three times a day (TID) | ORAL | Status: DC | PRN

## 2014-07-24 MED ORDER — METHYLPREDNISOLONE ACETATE 80 MG/ML IJ SUSP
80.0000 mg | Freq: Once | INTRAMUSCULAR | Status: AC
  Administered 2014-07-24: 80 mg via INTRAMUSCULAR

## 2014-07-24 MED ORDER — TRAMADOL HCL 50 MG PO TABS: 50.0000 mg | ORAL_TABLET | Freq: Three times a day (TID) | ORAL | Status: DC | PRN

## 2014-07-24 NOTE — Progress Notes (Signed)
Pre visit review using our clinic review tool, if applicable. No additional management support is needed unless otherwise documented below in the visit note. 

## 2014-07-24 NOTE — Patient Instructions (Signed)

## 2014-07-24 NOTE — Progress Notes (Signed)
Subjective:    Patient ID: Paul Schwartz, male    DOB: 19-Mar-1941, 73 y.o.   MRN: [id number]  HPI  IMPRESSION  1. 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 Underlying bilateral facet hypertrophy contributes to the foraminal stenosis at that level.  2. At L4-5, there is mild foraminal and lateral recess stenosis bilaterally due to annular disk bulging and facet hypertrophy. No definite nerve root encroachment is seen.  3. There is a lateral foraminal disk protrusion on the right at L3-4 which touches the right L3 nerve root just beyond the foramen and could be symptomatic.  4. No fractures or other acute findings are demonstrated.  *This report was called to Dr. Jerene Canny office at [place] hours on 03/06/04 and discussed with his nurse.   73 year old patient who presents with a four-day history of left lumbar pain radiating into the left groin area.  He was concerned about kidney stones.  Abdominal CT scan reviewed from 2000 revealed a small, nonobstructing stone on the right, but no left-sided stones. Prior to the onset of this pain.  He was doing considerable outdoor activities.  Pain is aggravated by movement. A lumbar MRI from 2005 reviewed.  This revealed bilateral facet hypertrophy with nerve encroachment on the left   Past Medical History  Diagnosis Date  . BPH (benign prostatic hyperplasia)   . Vertigo     dizziness  . Renal calculus   . GERD (gastroesophageal reflux disease)   . Arthritis   . Bilateral shoulder pain     History   Social History  . Marital Status: Married    Spouse Name: N/A    Number of Children: N/A  . Years of Education: N/A   Occupational History  . Not on file.   Social History Main Topics  . Smoking status: Former Smoker    Types: Cigarettes    Quit date: 04/07/1968  . Smokeless tobacco: Never Used  . Alcohol  Use: 1.8 oz/week    3 Glasses of wine per week  . Drug Use: No  . Sexual Activity: Yes   Other Topics Concern  . Not on file   Social History Narrative  . No narrative on file    Past Surgical History  Procedure Laterality Date  . Achilles tendon      surgical repair on the right    Family History  Problem Relation Age of Onset  . Heart disease Father     Allergies  Allergen Reactions  . Plasticized Base [Plastibase] Hives and Rash    Current Outpatient Prescriptions on File Prior to Visit  Medication Sig Dispense Refill  . aspirin 81 MG tablet Take 81 mg by mouth 2 (two) times daily.        . diclofenac (VOLTAREN) 75 MG EC tablet Take 1 tablet (75 mg total) by mouth 2 (two) times daily. Take one daily after meals  180 tablet  5  . LORazepam (ATIVAN) 0.5 MG tablet Take 1 tablet (0.5 mg total) by mouth 3 (three) times daily as needed for anxiety.  60 tablet  3  . Multiple Vitamin (MULTIVITAMIN) tablet Take 1 tablet by mouth daily.        . NON FORMULARY Zipcor 25 mg; q qid for arthritic pain       . tadalafil (CIALIS) 10 MG tablet Take 1 tablet (10 mg total) by  mouth daily as needed for erectile dysfunction.  10 tablet  6  . tamsulosin (FLOMAX) 0.4 MG CAPS capsule Take 1 capsule (0.4 mg total) by mouth daily. To prevent night time urination  90 capsule  5  . [DISCONTINUED] solifenacin (VESICARE) 10 MG tablet Take 0.5 tablets (5 mg total) by mouth daily.  30 tablet  11   Current Facility-Administered Medications on File Prior to Visit  Medication Dose Route Frequency Provider Last Rate Last Dose  . 0.9 %  sodium chloride infusion  500 mL Intravenous Continuous Ladene Artist, MD      . methylPREDNISolone acetate (DEPO-MEDROL) injection 120 mg  120 mg Intra-articular Once Hoover Browns., MD        BP 146/90  Pulse 73  Temp(Src) 97.7 F (36.5 C) (Oral)  Resp 20  Ht 5\' 7"  (1.702 m)  Wt 195 lb (88.451 kg)  BMI 30.53 kg/m2  SpO2 98%      Review of  Systems  Constitutional: Negative for fever, chills, appetite change and fatigue.  HENT: Negative for congestion, dental problem, ear pain, hearing loss, sore throat, tinnitus, trouble swallowing and voice change.   Eyes: Negative for pain, discharge and visual disturbance.  Respiratory: Negative for cough, chest tightness, wheezing and stridor.   Cardiovascular: Negative for chest pain, palpitations and leg swelling.  Gastrointestinal: Negative for nausea, vomiting, abdominal pain, diarrhea, constipation, blood in stool and abdominal distention.  Genitourinary: Negative for urgency, hematuria, flank pain, discharge, difficulty urinating and genital sores.  Musculoskeletal: Positive for back pain. Negative for arthralgias, gait problem, joint swelling, myalgias and neck stiffness.  Skin: Negative for rash.  Neurological: Negative for dizziness, syncope, speech difficulty, weakness, numbness and headaches.  Hematological: Negative for adenopathy. Does not bruise/bleed easily.  Psychiatric/Behavioral: Negative for behavioral problems and dysphoric mood. The patient is not nervous/anxious.        Objective:   Physical Exam  Constitutional: He appears well-developed and well-nourished. No distress.  Musculoskeletal:  Negative straight leg test Neurovascular structures intact          Assessment & Plan:   Low back pain.  Will treat with Depo-Medrol tramadol, and a bedtime dose of Flexeril.  Patient has physical scheduled in one month.  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

## 2014-08-15 ENCOUNTER — Ambulatory Visit (INDEPENDENT_AMBULATORY_CARE_PROVIDER_SITE_OTHER): Payer: Medicare Other

## 2014-08-15 DIAGNOSIS — Z23 Encounter for immunization: Secondary | ICD-10-CM

## 2014-09-15 ENCOUNTER — Other Ambulatory Visit (INDEPENDENT_AMBULATORY_CARE_PROVIDER_SITE_OTHER): Payer: Medicare Other

## 2014-09-15 ENCOUNTER — Other Ambulatory Visit: Payer: Medicare Other | Admitting: Internal Medicine

## 2014-09-15 DIAGNOSIS — Z0000 Encounter for general adult medical examination without abnormal findings: Secondary | ICD-10-CM

## 2014-09-15 DIAGNOSIS — I10 Essential (primary) hypertension: Secondary | ICD-10-CM

## 2014-09-15 DIAGNOSIS — N401 Enlarged prostate with lower urinary tract symptoms: Secondary | ICD-10-CM

## 2014-09-15 LAB — HEPATIC FUNCTION PANEL
ALBUMIN: 3.5 g/dL (ref 3.5–5.2)
ALK PHOS: 47 U/L (ref 39–117)
ALT: 20 U/L (ref 0–53)
AST: 28 U/L (ref 0–37)
Bilirubin, Direct: 0.2 mg/dL (ref 0.0–0.3)
TOTAL PROTEIN: 6.2 g/dL (ref 6.0–8.3)
Total Bilirubin: 1 mg/dL (ref 0.2–1.2)

## 2014-09-15 LAB — CBC WITH DIFFERENTIAL/PLATELET
Basophils Absolute: 0 10*3/uL (ref 0.0–0.1)
Basophils Relative: 0.6 % (ref 0.0–3.0)
EOS ABS: 0.1 10*3/uL (ref 0.0–0.7)
Eosinophils Relative: 2.6 % (ref 0.0–5.0)
HEMATOCRIT: 42.8 % (ref 39.0–52.0)
Hemoglobin: 14.3 g/dL (ref 13.0–17.0)
LYMPHS ABS: 1 10*3/uL (ref 0.7–4.0)
Lymphocytes Relative: 17.9 % (ref 12.0–46.0)
MCHC: 33.4 g/dL (ref 30.0–36.0)
MCV: 95.4 fl (ref 78.0–100.0)
MONO ABS: 0.4 10*3/uL (ref 0.1–1.0)
Monocytes Relative: 7.4 % (ref 3.0–12.0)
NEUTROS PCT: 71.5 % (ref 43.0–77.0)
Neutro Abs: 4 10*3/uL (ref 1.4–7.7)
PLATELETS: 174 10*3/uL (ref 150.0–400.0)
RBC: 4.49 Mil/uL (ref 4.22–5.81)
RDW: 14 % (ref 11.5–15.5)
WBC: 5.7 10*3/uL (ref 4.0–10.5)

## 2014-09-15 LAB — LIPID PANEL
CHOL/HDL RATIO: 4
CHOLESTEROL: 179 mg/dL (ref 0–200)
HDL: 45.3 mg/dL (ref >39.00)
LDL Cholesterol: 119 mg/dL — ABNORMAL HIGH (ref 0–99)
NonHDL: 133.7
TRIGLYCERIDES: 76 mg/dL (ref 0.0–149.0)
VLDL: 15.2 mg/dL (ref 0.0–40.0)

## 2014-09-15 LAB — BASIC METABOLIC PANEL
BUN: 18 mg/dL (ref 6–23)
CHLORIDE: 105 meq/L (ref 96–112)
CO2: 32 meq/L (ref 19–32)
CREATININE: 0.9 mg/dL (ref 0.4–1.5)
Calcium: 9.3 mg/dL (ref 8.4–10.5)
GFR: 93.56 mL/min (ref >60.00)
GLUCOSE: 86 mg/dL (ref 70–99)
Potassium: 3.8 mEq/L (ref 3.5–5.1)
Sodium: 141 mEq/L (ref 135–145)

## 2014-09-15 LAB — POCT URINALYSIS DIPSTICK
Bilirubin, UA: NEGATIVE
Blood, UA: NEGATIVE
GLUCOSE UA: NEGATIVE
KETONES UA: NEGATIVE
LEUKOCYTES UA: NEGATIVE
Nitrite, UA: NEGATIVE
Protein, UA: NEGATIVE
SPEC GRAV UA: 1.02
UROBILINOGEN UA: 0.2
pH, UA: 6

## 2014-09-15 LAB — TSH: TSH: 1.38 u[IU]/mL (ref 0.35–4.50)

## 2014-09-15 LAB — PSA: PSA: 5.35 ng/mL — AB (ref 0.10–4.00)

## 2014-09-20 ENCOUNTER — Encounter: Payer: Self-pay | Admitting: *Deleted

## 2014-09-20 ENCOUNTER — Encounter: Payer: Medicare Other | Admitting: Internal Medicine

## 2014-09-22 ENCOUNTER — Encounter: Payer: Medicare Other | Admitting: Internal Medicine

## 2014-10-04 ENCOUNTER — Encounter: Payer: Self-pay | Admitting: Internal Medicine

## 2014-10-04 ENCOUNTER — Ambulatory Visit (INDEPENDENT_AMBULATORY_CARE_PROVIDER_SITE_OTHER): Payer: Medicare Other | Admitting: Internal Medicine

## 2014-10-04 VITALS — BP 154/86 | HR 79 | Temp 97.0°F | Ht 67.0 in | Wt 197.0 lb

## 2014-10-04 DIAGNOSIS — Z860100 Personal history of colon polyps, unspecified: Secondary | ICD-10-CM

## 2014-10-04 DIAGNOSIS — K219 Gastro-esophageal reflux disease without esophagitis: Secondary | ICD-10-CM

## 2014-10-04 DIAGNOSIS — R0989 Other specified symptoms and signs involving the circulatory and respiratory systems: Secondary | ICD-10-CM

## 2014-10-04 DIAGNOSIS — Z0000 Encounter for general adult medical examination without abnormal findings: Secondary | ICD-10-CM

## 2014-10-04 DIAGNOSIS — F528 Other sexual dysfunction not due to a substance or known physiological condition: Secondary | ICD-10-CM

## 2014-10-04 DIAGNOSIS — I10 Essential (primary) hypertension: Secondary | ICD-10-CM

## 2014-10-04 MED ORDER — CIPROFLOXACIN HCL 500 MG PO TABS: 500.0000 mg | ORAL_TABLET | Freq: Two times a day (BID) | ORAL | Status: DC

## 2014-10-04 MED ORDER — LORAZEPAM 0.5 MG PO TABS: 0.5000 mg | ORAL_TABLET | Freq: Three times a day (TID) | ORAL | Status: DC | PRN

## 2014-10-04 NOTE — Progress Notes (Signed)
Patient ID: Paul Schwartz, male   DOB: 07/14/1941, 72 y.o.   MRN: [id number]  Subjective:    Patient ID: Paul Schwartz, male    DOB: 1941/07/17, 73 y.o.   MRN: [id number]  HPI 73 year-old patient who is seen today for a wellness exam.  He is a former patient of Dr. Joni Fears. Medical problems include a history of colonic polyps;  he had a followup colonoscopy in June of 2012;   he has obesity erectile dysfunction and a history of hypertension. Presently he is not requiring medication. He has been evaluated for a possible cervical radiculopathy and has had nerve conduction studies. He has a history of BPH remote tobacco use and a history of hypogonadism. Medical regimen reviewed. Laboratory studies were reviewed and revealed a slight elevated PSA   Past Medical History  Diagnosis Date  . BPH (benign prostatic hyperplasia)   . Vertigo     dizziness  . Renal calculus   . GERD (gastroesophageal reflux disease)   . Arthritis   . Bilateral shoulder pain     History   Social History  . Marital Status: Married    Spouse Name: N/A    Number of Children: N/A  . Years of Education: N/A   Occupational History  . Not on file.   Social History Main Topics  . Smoking status: Former Smoker    Types: Cigarettes    Quit date: 04/07/1968  . Smokeless tobacco: Never Used  . Alcohol Use: 1.8 oz/week    3 Glasses of wine per week  . Drug Use: No  . Sexual Activity: Yes   Other Topics Concern  . Not on file   Social History Narrative    Past Surgical History  Procedure Laterality Date  . Achilles tendon      surgical repair on the right    Family History  Problem Relation Age of Onset  . Heart disease Father     Allergies  Allergen Reactions  . Plasticized Base [Plastibase] Hives and Rash    Current Outpatient Prescriptions on File Prior to Visit  Medication Sig Dispense Refill  . aspirin 81 MG tablet Take 81 mg by mouth 2 (two) times daily.      . cyclobenzaprine  (FLEXERIL) 5 MG tablet Take 1 tablet (5 mg total) by mouth 3 (three) times daily as needed for muscle spasms. 30 tablet 1  . LORazepam (ATIVAN) 0.5 MG tablet Take 1 tablet (0.5 mg total) by mouth 3 (three) times daily as needed for anxiety. 60 tablet 3  . Multiple Vitamin (MULTIVITAMIN) tablet Take 1 tablet by mouth daily.      . NON FORMULARY Zipcor 25 mg; q qid for arthritic pain     . tadalafil (CIALIS) 10 MG tablet Take 1 tablet (10 mg total) by mouth daily as needed for erectile dysfunction. 10 tablet 6  . tamsulosin (FLOMAX) 0.4 MG CAPS capsule Take 1 capsule (0.4 mg total) by mouth daily. To prevent night time urination 90 capsule 5  . traMADol (ULTRAM) 50 MG tablet Take 1 tablet (50 mg total) by mouth every 8 (eight) hours as needed. 30 tablet 0  . [DISCONTINUED] solifenacin (VESICARE) 10 MG tablet Take 0.5 tablets (5 mg total) by mouth daily. 30 tablet 11   Current Facility-Administered Medications on File Prior to Visit  Medication Dose Route Frequency Provider Last Rate Last Dose  . 0.9 %  sodium chloride infusion  500 mL Intravenous Continuous Ladene Artist, MD      .  methylPREDNISolone acetate (DEPO-MEDROL) injection 120 mg  120 mg Intra-articular Once Hoover Browns., MD        BP 154/86 mmHg  Pulse 79  Temp(Src) 97 F (36.1 C) (Oral)  Ht 5\' 7"  (1.702 m)  Wt 197 lb (89.359 kg)  BMI 30.85 kg/m2  1. Risk factors, based on past  M,S,F history- cardiovascular risk factors include a history of hypertension. Presently controlled without medications  2.  Physical activities: No acute restrictions does have some significant arthritis but this involves primarily shoulder and neck region  3.  Depression/mood: No history depression or mood disorder  4.  Hearing: No deficits. Has seen Dr. Erik Obey in the past  5.  ADL's: Independent in all aspects of daily living 6.  Fall risk: Low  7.  Home safety: No problems identified  8.  Height weight, and visual acuity; height  and weight stable no visual deficits  9.  Counseling: We'll regular exercise modest weight loss encouraged  10. Lab orders based on risk factors: Laboratory profile will be reviewed  11. Referral : Followup orthopedic  12. Care plan: Orthopedic followup followup nerve conduction studies;  followup colonoscopy in 4 years.  Will recheck in 4 months and follow-up with PSA total and free.  Urology referral.  Discussed  31. Cognitive assessment: Alert and oriented normal affect. No cognitive dysfunction  14.  Preventive services will include an annual health examination with screening lab.  He will have colonoscopies every 5 years in view of his history of colonic polyps.  Patient was provided with a written and personalized care plan  15.  Provider list includes primary care orthopedics and GI        Review of Systems  Constitutional: Negative for fever, chills, appetite change and fatigue.  HENT: Negative for congestion, dental problem, ear pain, hearing loss, sore throat, tinnitus, trouble swallowing and voice change.   Eyes: Negative for pain, discharge and visual disturbance.  Respiratory: Negative for cough, chest tightness, wheezing and stridor.   Cardiovascular: Negative for chest pain, palpitations and leg swelling.  Gastrointestinal: Negative for nausea, vomiting, abdominal pain, diarrhea, constipation, blood in stool and abdominal distention.  Genitourinary: Positive for urgency and frequency. Negative for hematuria, flank pain, discharge, difficulty urinating and genital sores.  Musculoskeletal: Negative for myalgias, back pain, joint swelling, gait problem and neck stiffness. Arthralgias: Shoulder and neck pain.  Skin: Negative for rash.  Neurological: Negative for dizziness, syncope, speech difficulty, weakness, numbness and headaches.  Hematological: Negative for adenopathy. Does not bruise/bleed easily.  Psychiatric/Behavioral: Negative for behavioral problems and  dysphoric mood. The patient is not nervous/anxious.        Objective:   Physical Exam  Constitutional: He appears well-developed and well-nourished.  Blood pressure 140/80 Weight 197  HENT:  Head: Normocephalic and atraumatic.  Right Ear: External ear normal.  Left Ear: External ear normal.  Nose: Nose normal.  Mouth/Throat: Oropharynx is clear and moist.  Eyes: Conjunctivae and EOM are normal. Pupils are equal, round, and reactive to light. No scleral icterus.  Neck: Normal range of motion. Neck supple. No JVD present. No thyromegaly present.  Diminished right carotid upstroke  Cardiovascular: Regular rhythm and intact distal pulses.  Exam reveals no gallop and no friction rub.   Murmur heard. Grade 2/6 systolic murmur Pedal pulses not easily palpable except for a full right dorsalis pedis pulse  Pulmonary/Chest: Effort normal and breath sounds normal. He exhibits no tenderness.  Abdominal: Soft. Bowel sounds are normal. He  exhibits no distension and no mass. There is no tenderness.  Genitourinary: Penis normal. Guaiac negative stool.  Prostate plus 3 on large smooth and symmetrical  Musculoskeletal: Normal range of motion. He exhibits no edema or tenderness.  Lymphadenopathy:    He has no cervical adenopathy.  Neurological: He is alert. He has normal reflexes. No cranial nerve deficit. Coordination normal.  Skin: Skin is warm and dry. No rash noted.  Psychiatric: He has a normal mood and affect. His behavior is normal.          Assessment & Plan:   Exogenous obesity.  Weight loss encouraged  History of hypertension. Currently stable off medication Osteoarthritis. Followup orthopedic. Followup nerve conduction studies BPH /elevated PSA.  Options discussed including urology follow-up.  We'll repeat total and free PSA in 4 months and consider urology referral at that time  Decreased right carotid upstroke.  Will obtain a carotid artery Doppler study  History of colonic  polyps followup colonoscopy in  2 years  Medical regimen unchanged

## 2014-10-04 NOTE — Patient Instructions (Addendum)
Limit your sodium (Salt) intake  Please check your blood pressure on a regular basis.  If it is consistently greater than 150/90, please make an office appointment.  Return in 4 months for follow-up  You need to lose weight.  Consider a lower calorie diet and regular exercise.Health Maintenance A healthy lifestyle and preventative care can promote health and wellness.  Maintain regular health, dental, and eye exams.  Eat a healthy diet. Foods like vegetables, fruits, whole grains, low-fat dairy products, and lean protein foods contain the nutrients you need and are low in calories. Decrease your intake of foods high in solid fats, added sugars, and salt. Get information about a proper diet from your health care provider, if necessary.  Regular physical exercise is one of the most important things you can do for your health. Most adults should get at least 150 minutes of moderate-intensity exercise (any activity that increases your heart rate and causes you to sweat) each week. In addition, most adults need muscle-strengthening exercises on 2 or more days a week.   Maintain a healthy weight. The body mass index (BMI) is a screening tool to identify possible weight problems. It provides an estimate of body fat based on height and weight. Your health care provider can find your BMI and can help you achieve or maintain a healthy weight. For males 20 years and older:  A BMI below 18.5 is considered underweight.  A BMI of 18.5 to 24.9 is normal.  A BMI of 25 to 29.9 is considered overweight.  A BMI of 30 and above is considered obese.  Maintain normal blood lipids and cholesterol by exercising and minimizing your intake of saturated fat. Eat a balanced diet with plenty of fruits and vegetables. Blood tests for lipids and cholesterol should begin at age 38 and be repeated every 5 years. If your lipid or cholesterol levels are high, you are over age 82, or you are at high risk for heart disease,  you may need your cholesterol levels checked more frequently.Ongoing high lipid and cholesterol levels should be treated with medicines if diet and exercise are not working.  If you smoke, find out from your health care provider how to quit. If you do not use tobacco, do not start.  Lung cancer screening is recommended for adults aged 34-80 years who are at high risk for developing lung cancer because of a history of smoking. A yearly low-dose CT scan of the lungs is recommended for people who have at least a 30-pack-year history of smoking and are current smokers or have quit within the past 15 years. A pack year of smoking is smoking an average of 1 pack of cigarettes a day for 1 year (for example, a 30-pack-year history of smoking could mean smoking 1 pack a day for 30 years or 2 packs a day for 15 years). Yearly screening should continue until the smoker has stopped smoking for at least 15 years. Yearly screening should be stopped for people who develop a health problem that would prevent them from having lung cancer treatment.  If you choose to drink alcohol, do not have more than 2 drinks per day. One drink is considered to be 12 oz (360 mL) of beer, 5 oz (150 mL) of wine, or 1.5 oz (45 mL) of liquor.  Avoid the use of street drugs. Do not share needles with anyone. Ask for help if you need support or instructions about stopping the use of drugs.  High blood pressure  causes heart disease and increases the risk of stroke. Blood pressure should be checked at least every 1-2 years. Ongoing high blood pressure should be treated with medicines if weight loss and exercise are not effective.  If you are 71-73 years old, ask your health care provider if you should take aspirin to prevent heart disease.  Diabetes screening involves taking a blood sample to check your fasting blood sugar level. This should be done once every 3 years after age 59 if you are at a normal weight and without risk factors for  diabetes. Testing should be considered at a younger age or be carried out more frequently if you are overweight and have at least 1 risk factor for diabetes.  Colorectal cancer can be detected and often prevented. Most routine colorectal cancer screening begins at the age of 67 and continues through age 28. However, your health care provider may recommend screening at an earlier age if you have risk factors for colon cancer. On a yearly basis, your health care provider may provide home test kits to check for hidden blood in the stool. A small camera at the end of a tube may be used to directly examine the colon (sigmoidoscopy or colonoscopy) to detect the earliest forms of colorectal cancer. Talk to your health care provider about this at age 73 when routine screening begins. A direct exam of the colon should be repeated every 5-10 years through age 2, unless early forms of precancerous polyps or small growths are found.  People who are at an increased risk for hepatitis B should be screened for this virus. You are considered at high risk for hepatitis B if:  You were born in a country where hepatitis B occurs often. Talk with your health care provider about which countries are considered high risk.  Your parents were born in a high-risk country and you have not received a shot to protect against hepatitis B (hepatitis B vaccine).  You have HIV or AIDS.  You use needles to inject street drugs.  You live with, or have sex with, someone who has hepatitis B.  You are a man who has sex with other men (MSM).  You get hemodialysis treatment.  You take certain medicines for conditions like cancer, organ transplantation, and autoimmune conditions.  Hepatitis C blood testing is recommended for all people born from [birth year] through 1966 and any individual with known risk factors for hepatitis C.  Healthy men should no longer receive prostate-specific antigen (PSA) blood tests as part of routine cancer  screening. Talk to your health care provider about prostate cancer screening.  Testicular cancer screening is not recommended for adolescents or adult males who have no symptoms. Screening includes self-exam, a health care provider exam, and other screening tests. Consult with your health care provider about any symptoms you have or any concerns you have about testicular cancer.  Practice safe sex. Use condoms and avoid high-risk sexual practices to reduce the spread of sexually transmitted infections (STIs).  You should be screened for STIs, including gonorrhea and chlamydia if:  You are sexually active and are younger than 24 years.  You are older than 24 years, and your health care provider tells you that you are at risk for this type of infection.  Your sexual activity has changed since you were last screened, and you are at an increased risk for chlamydia or gonorrhea. Ask your health care provider if you are at risk.  If you are at risk of  being infected with HIV, it is recommended that you take a prescription medicine daily to prevent HIV infection. This is called pre-exposure prophylaxis (PrEP). You are considered at risk if:  You are a man who has sex with other men (MSM).  You are a heterosexual man who is sexually active with multiple partners.  You take drugs by injection.  You are sexually active with a partner who has HIV.  Talk with your health care provider about whether you are at high risk of being infected with HIV. If you choose to begin PrEP, you should first be tested for HIV. You should then be tested every 3 months for as long as you are taking PrEP.  Use sunscreen. Apply sunscreen liberally and repeatedly throughout the day. You should seek shade when your shadow is shorter than you. Protect yourself by wearing long sleeves, pants, a wide-brimmed hat, and sunglasses year round whenever you are outdoors.  Tell your health care provider of new moles or changes in  moles, especially if there is a change in shape or color. Also, tell your health care provider if a mole is larger than the size of a pencil eraser.  A one-time screening for abdominal aortic aneurysm (AAA) and surgical repair of large AAAs by ultrasound is recommended for men aged 88-75 years who are current or former smokers.  Stay current with your vaccines (immunizations). Document Released: 04/24/2008 Document Revised: 11/01/2013 Document Reviewed: 03/24/2011 [facility] Patient Information 2015 [place], [place]. This information is not intended to replace advice given to you by your health care provider. Make sure you discuss any questions you have with your health care provider.

## 2014-10-04 NOTE — Progress Notes (Signed)
Pre visit review using our clinic review tool, if applicable. No additional management support is needed unless otherwise documented below in the visit note. 

## 2014-10-12 ENCOUNTER — Ambulatory Visit (HOSPITAL_COMMUNITY): Payer: Medicare Other | Attending: Internal Medicine | Admitting: Cardiology

## 2014-10-12 DIAGNOSIS — Z87891 Personal history of nicotine dependence: Secondary | ICD-10-CM | POA: Insufficient documentation

## 2014-10-12 DIAGNOSIS — R0989 Other specified symptoms and signs involving the circulatory and respiratory systems: Secondary | ICD-10-CM | POA: Insufficient documentation

## 2014-10-12 DIAGNOSIS — I10 Essential (primary) hypertension: Secondary | ICD-10-CM | POA: Insufficient documentation

## 2014-10-12 NOTE — Progress Notes (Signed)
Carotid duplex performed 

## 2014-10-18 ENCOUNTER — Other Ambulatory Visit: Payer: Self-pay | Admitting: Internal Medicine

## 2015-02-19 DIAGNOSIS — H40033 Anatomical narrow angle, bilateral: Secondary | ICD-10-CM | POA: Diagnosis not present

## 2015-02-19 DIAGNOSIS — H3531 Nonexudative age-related macular degeneration: Secondary | ICD-10-CM | POA: Diagnosis not present

## 2015-02-19 DIAGNOSIS — H25011 Cortical age-related cataract, right eye: Secondary | ICD-10-CM | POA: Diagnosis not present

## 2015-02-19 DIAGNOSIS — H40023 Open angle with borderline findings, high risk, bilateral: Secondary | ICD-10-CM | POA: Diagnosis not present

## 2015-03-26 ENCOUNTER — Other Ambulatory Visit: Payer: Self-pay | Admitting: Internal Medicine

## 2015-06-25 ENCOUNTER — Encounter: Payer: Self-pay | Admitting: Internal Medicine

## 2015-06-25 ENCOUNTER — Ambulatory Visit (INDEPENDENT_AMBULATORY_CARE_PROVIDER_SITE_OTHER): Payer: Medicare Other | Admitting: Internal Medicine

## 2015-06-25 VITALS — BP 138/80 | HR 76 | Temp 99.0°F | Resp 20 | Ht 67.0 in | Wt 189.0 lb

## 2015-06-25 DIAGNOSIS — Z87891 Personal history of nicotine dependence: Secondary | ICD-10-CM | POA: Diagnosis not present

## 2015-06-25 DIAGNOSIS — I10 Essential (primary) hypertension: Secondary | ICD-10-CM | POA: Diagnosis not present

## 2015-06-25 MED ORDER — HYDROCODONE-HOMATROPINE 5-1.5 MG/5ML PO SYRP: 5.0000 mL | ORAL_SOLUTION | Freq: Four times a day (QID) | ORAL | Status: DC | PRN

## 2015-06-25 NOTE — Progress Notes (Signed)
Pre visit review using our clinic review tool, if applicable. No additional management support is needed unless otherwise documented below in the visit note. 

## 2015-06-25 NOTE — Progress Notes (Signed)
Subjective:    Patient ID: Paul Schwartz, male    DOB: 05/20/1941, 74 y.o.   MRN: [id number]  HPI 74 year old patient who presents with a 5 day history of chest congestion and cough.  This began after returning from [place] for a visit with his grandchildren, one of whom was ill.  No fever, wheezing, shortness of breath or chest pain.  Cough is minimally productive.  He has been using guaifenesin. No fever or chills He has essential hypertension which has been stable  Past Medical History  Diagnosis Date  . BPH (benign prostatic hyperplasia)   . Vertigo     dizziness  . Renal calculus   . GERD (gastroesophageal reflux disease)   . Arthritis   . Bilateral shoulder pain     Social History   Social History  . Marital Status: Married    Spouse Name: N/A  . Number of Children: N/A  . Years of Education: N/A   Occupational History  . Not on file.   Social History Main Topics  . Smoking status: Former Smoker    Types: Cigarettes    Quit date: 04/07/1968  . Smokeless tobacco: Never Used  . Alcohol Use: 1.8 oz/week    3 Glasses of wine per week  . Drug Use: No  . Sexual Activity: Yes   Other Topics Concern  . Not on file   Social History Narrative    Past Surgical History  Procedure Laterality Date  . Achilles tendon      surgical repair on the right    Family History  Problem Relation Age of Onset  . Heart disease Father     Allergies  Allergen Reactions  . Plasticized Base [Plastibase] Hives and Rash    Current Outpatient Prescriptions on File Prior to Visit  Medication Sig Dispense Refill  . aspirin 81 MG tablet Take 81 mg by mouth 2 (two) times daily.      . cyclobenzaprine (FLEXERIL) 5 MG tablet Take 1 tablet (5 mg total) by mouth 3 (three) times daily as needed for muscle spasms. 30 tablet 1  . diclofenac (VOLTAREN) 75 MG EC tablet TAKE 1 TABLET TWICE A DAY AFTER MEALS 180 tablet 3  . LORazepam (ATIVAN) 0.5 MG tablet TAKE 1 TABLET 3 TIMES A DAY  AS NEEDED FOR ANXIETY 60 tablet 2  . Multiple Vitamin (MULTIVITAMIN) tablet Take 1 tablet by mouth daily.      . NON FORMULARY Zipcor 25 mg; q qid for arthritic pain     . tadalafil (CIALIS) 10 MG tablet Take 1 tablet (10 mg total) by mouth daily as needed for erectile dysfunction. 10 tablet 6  . tamsulosin (FLOMAX) 0.4 MG CAPS capsule Take 1 capsule (0.4 mg total) by mouth daily. To prevent night time urination 90 capsule 5  . traMADol (ULTRAM) 50 MG tablet Take 1 tablet (50 mg total) by mouth every 8 (eight) hours as needed. 30 tablet 0   Current Facility-Administered Medications on File Prior to Visit  Medication Dose Route Frequency Provider Last Rate Last Dose  . 0.9 %  sodium chloride infusion  500 mL Intravenous Continuous Ladene Artist, MD      . methylPREDNISolone acetate (DEPO-MEDROL) injection 120 mg  120 mg Intra-articular Once Hoover Browns., MD        BP 138/80 mmHg  Pulse 76  Temp(Src) 99 F (37.2 C) (Oral)  Resp 20  Ht 5\' 7"  (1.702 m)  Wt 189 lb (85.73 kg)  BMI 29.59 kg/m2  SpO2 98%      Review of Systems  Constitutional: Negative for fever, chills, appetite change and fatigue.  HENT: Positive for congestion. Negative for dental problem, ear pain, hearing loss, sore throat, tinnitus, trouble swallowing and voice change.   Eyes: Negative for pain, discharge and visual disturbance.  Respiratory: Positive for cough. Negative for chest tightness, wheezing and stridor.   Cardiovascular: Negative for chest pain, palpitations and leg swelling.  Gastrointestinal: Negative for nausea, vomiting, abdominal pain, diarrhea, constipation, blood in stool and abdominal distention.  Genitourinary: Negative for urgency, hematuria, flank pain, discharge, difficulty urinating and genital sores.  Musculoskeletal: Negative for myalgias, back pain, joint swelling, arthralgias, gait problem and neck stiffness.  Skin: Negative for rash.  Neurological: Negative for dizziness,  syncope, speech difficulty, weakness, numbness and headaches.  Hematological: Negative for adenopathy. Does not bruise/bleed easily.  Psychiatric/Behavioral: Negative for behavioral problems and dysphoric mood. The patient is not nervous/anxious.        Objective:   Physical Exam  Constitutional: He is oriented to person, place, and time. He appears well-developed.  HENT:  Head: Normocephalic.  Right Ear: External ear normal.  Left Ear: External ear normal.  Eyes: Conjunctivae and EOM are normal.  Neck: Normal range of motion.  Cardiovascular: Normal rate and normal heart sounds.   Pulmonary/Chest: Breath sounds normal.  Abdominal: Bowel sounds are normal.  Musculoskeletal: Normal range of motion. He exhibits no edema or tenderness.  Neurological: He is alert and oriented to person, place, and time.  Psychiatric: He has a normal mood and affect. His behavior is normal.          Assessment & Plan:   Viral URI with cough.  Will treat symptomatically Hypertension

## 2015-06-25 NOTE — Patient Instructions (Signed)
Acute bronchitis symptoms for less than 10 days are generally not helped by antibiotics.  Take over-the-counter expectorants and cough medications such as  Mucinex DM.  Call if there is no improvement in 5 to 7 days or if  you develop worsening cough, fever, or new symptoms, such as shortness of breath or chest pain.   

## 2015-06-28 ENCOUNTER — Telehealth: Payer: Self-pay | Admitting: Internal Medicine

## 2015-06-28 NOTE — Telephone Encounter (Signed)
Wife said pt still has a heavy cough and is asking if the following can be refill   HYDROcodone-homatropine (HYCODAN) 5-1.5 MG/5ML syrup

## 2015-06-28 NOTE — Telephone Encounter (Signed)
ok 

## 2015-06-28 NOTE — Telephone Encounter (Signed)
Spoke to pt's wife Berniece Andreas told her can refill cough medicine for pt, but will not be ready till tomorrow morning. Marsala said that will be fine is there anything stronger? Told her no that is the strongest we can prescribe. Marsala verbalized understanding.

## 2015-06-28 NOTE — Telephone Encounter (Signed)
Okay to refill Hycodan cough syrup?

## 2015-06-29 MED ORDER — HYDROCODONE-HOMATROPINE 5-1.5 MG/5ML PO SYRP: 5.0000 mL | ORAL_SOLUTION | Freq: Four times a day (QID) | ORAL | Status: DC | PRN

## 2015-06-29 NOTE — Telephone Encounter (Signed)
Left detailed message on home and mobile Rx ready for pickup, will be at the front desk. Rx printed and signed.

## 2015-07-05 ENCOUNTER — Ambulatory Visit (INDEPENDENT_AMBULATORY_CARE_PROVIDER_SITE_OTHER): Payer: Medicare Other | Admitting: Family Medicine

## 2015-07-05 ENCOUNTER — Encounter: Payer: Self-pay | Admitting: Family Medicine

## 2015-07-05 ENCOUNTER — Telehealth: Payer: Self-pay | Admitting: Internal Medicine

## 2015-07-05 VITALS — BP 132/80 | HR 90 | Temp 98.0°F | Wt 187.0 lb

## 2015-07-05 DIAGNOSIS — R05 Cough: Secondary | ICD-10-CM | POA: Diagnosis not present

## 2015-07-05 DIAGNOSIS — R059 Cough, unspecified: Secondary | ICD-10-CM

## 2015-07-05 MED ORDER — AZITHROMYCIN 250 MG PO TABS: ORAL_TABLET | ORAL | Status: DC

## 2015-07-05 NOTE — Telephone Encounter (Signed)
Noted  

## 2015-07-05 NOTE — Telephone Encounter (Signed)
Patient Name: Paul Schwartz DOB: 1941/03/02 Initial Comment Caller states her husband has bronchitis, still has cough after two bottles of cough syrup Nurse Assessment Nurse: Ronnald Ramp, RN, Miranda Date/Time (Eastern Time): 07/05/2015 12:47:41 PM Confirm and document reason for call. If symptomatic, describe symptoms. ---Caller states her husband has been diagnosed with Bronchitis and has productive cough. Has had cough for 3 weeks. Has the patient traveled out of the country within the last 30 days? ---No Does the patient require triage? ---Yes Related visit to physician within the last 2 weeks? ---No Does the PT have any chronic conditions? (i.e. diabetes, asthma, etc.) ---Yes Guidelines Guideline Title Affirmed Question Affirmed Notes Cough - Acute Productive SEVERE coughing spells (e.g., whooping sound after coughing, vomiting after coughing) Final Disposition User See Physician within 24 Hours Ronnald Ramp, RN, Miranda Comments No appts available today or tomorrow with PCP, Appt schedule for today at 3:30pm with Dr. Elease Hashimoto. Referrals REFERRED TO PCP OFFICE Disagree/Comply: Comply

## 2015-07-05 NOTE — Progress Notes (Signed)
Pre visit review using our clinic review tool, if applicable. No additional management support is needed unless otherwise documented below in the visit note. 

## 2015-07-05 NOTE — Telephone Encounter (Signed)
FYI!  PT SCHEDULED TO SEE DR. B.

## 2015-07-05 NOTE — Progress Notes (Signed)
   Subjective:    Patient ID: Paul Schwartz, male    DOB: 1941-04-21, 74 y.o.   MRN: [id number]  HPI Patient seen with persistent cough for 3 weeks. Mostly nonproductive. Quit smoking way back in 1969. Using Hycodan cough syrup with some relief. He denies any postnasal drip symptoms. No GERD symptoms. No appetite or weight changes. No hemoptysis. No wheezing. No dyspnea. No associated fever or chills  Past Medical History  Diagnosis Date  . BPH (benign prostatic hyperplasia)   . Vertigo     dizziness  . Renal calculus   . GERD (gastroesophageal reflux disease)   . Arthritis   . Bilateral shoulder pain    Past Surgical History  Procedure Laterality Date  . Achilles tendon      surgical repair on the right    reports that he quit smoking about 47 years ago. His smoking use included Cigarettes. He has never used smokeless tobacco. He reports that he drinks about 1.8 oz of alcohol per week. He reports that he does not use illicit drugs. family history includes Heart disease in his father. Allergies  Allergen Reactions  . Plasticized Base [Plastibase] Hives and Rash      Review of Systems  Constitutional: Negative for fever and chills.  HENT: Negative for congestion and sore throat.   Respiratory: Positive for cough. Negative for shortness of breath and wheezing.   Cardiovascular: Negative for chest pain.       Objective:   Physical Exam  Constitutional: He appears well-developed and well-nourished. No distress.  HENT:  Right Ear: External ear normal.  Left Ear: External ear normal.  Mouth/Throat: Oropharynx is clear and moist.  Neck: Neck supple.  Cardiovascular: Normal rate and regular rhythm.   Pulmonary/Chest: Effort normal and breath sounds normal. No respiratory distress. He has no wheezes. He has no rales.  Lymphadenopathy:    He has no cervical adenopathy.          Assessment & Plan:  Persistent cough. Nonfocal exam. Doubt atypical infection but given  duration will cover with Zithromax. Recommended over-the-counter Mucinex. Drink plenty of fluids. Follow-up with primary in 1-2 weeks if cough not resolving.

## 2015-07-05 NOTE — Patient Instructions (Signed)
Call in one week if cough not better   Tos - Adultos  (Cough, Adult)  La tos es un reflejo que ayuda a limpiar las vas areas y Patent examiner. Puede ayudar a curar el organismo o ser Ardelia Mems reaccin a un irritante. La tos puede durar [employer] 2  3 semanas (aguda) o puede durar ms de 8 semanas (crnica)  CAUSAS  Tos aguda:   Infecciones virales o bacterianas. Tos crnica.   Infecciones.  Alergias.  Asma.  Goteo post nasal.  El hbito de fumar.  Acidez o reflujo gstrico.  Algunos medicamentos.  Problemas pulmonares crnicos  Cncer. SNTOMAS   Tos.  Cristy Hilts.  Dolor en el pecho.  Aumento en el ritmo respiratorio.  Ruidos agudos al respirar (sibilancias).  Moco coloreado al toser (esputo). TRATAMIENTO   Un tos de causa bacteriana puede tratarse con antibiticos.  La tos de origen viral debe seguir su curso y no responde a los antibiticos.  El mdico podr recomendar otros tratamientos si tiene tos crnica. INSTRUCCIONES PARA EL CUIDADO EN EL HOGAR   Solo tome medicamentos que se pueden comprar sin receta o recetados para Conservation officer, historic buildings, Tree surgeon o fiebre, como le indica el mdico. Utilice antitusivos slo en la forma indicada por el mdico.  Use un vaporizador o humidificador de niebla fra en la habitacin para ayudar a aflojar las secreciones.  Duerma en posicin semi erguida si la tos empeora por la noche.  Descanse todo lo que pueda.  Si fuma, abandone el hbito. SOLICITE ATENCIN MDICA DE INMEDIATO SI:   Observa pus en el esputo.  La tos empeora.  No puede controlar la tos con antitusivos y no puede dormir debido a Presenter, broadcasting.  Comienza a escupir sangre al toser.  Tiene dificultad para respirar.  El dolor empeora o no puede controlarlo con los medicamentos.  Tiene fiebre. ASEGRESE DE QUE:   Comprende estas instrucciones.  Controlar su enfermedad.  Solicitar ayuda de inmediato si no mejora o si empeora. Document Released: 06/04/2011 Document  Revised: 01/19/2012 [facility] Patient Information 2015 [place]. This information is not intended to replace advice given to you by your health care provider. Make sure you discuss any questions you have with your health care provider.

## 2015-07-10 ENCOUNTER — Encounter: Payer: Self-pay | Admitting: Internal Medicine

## 2015-07-10 ENCOUNTER — Ambulatory Visit (INDEPENDENT_AMBULATORY_CARE_PROVIDER_SITE_OTHER): Payer: Medicare Other | Admitting: Internal Medicine

## 2015-07-10 VITALS — BP 132/90 | HR 79 | Temp 98.4°F | Resp 20 | Ht 67.0 in | Wt 190.0 lb

## 2015-07-10 DIAGNOSIS — K219 Gastro-esophageal reflux disease without esophagitis: Secondary | ICD-10-CM | POA: Diagnosis not present

## 2015-07-10 DIAGNOSIS — I10 Essential (primary) hypertension: Secondary | ICD-10-CM | POA: Diagnosis not present

## 2015-07-10 DIAGNOSIS — R05 Cough: Secondary | ICD-10-CM

## 2015-07-10 DIAGNOSIS — J069 Acute upper respiratory infection, unspecified: Secondary | ICD-10-CM

## 2015-07-10 DIAGNOSIS — R053 Chronic cough: Secondary | ICD-10-CM

## 2015-07-10 DIAGNOSIS — B9789 Other viral agents as the cause of diseases classified elsewhere: Secondary | ICD-10-CM

## 2015-07-10 MED ORDER — PREDNISONE 10 MG PO TABS: 10.0000 mg | ORAL_TABLET | Freq: Two times a day (BID) | ORAL | Status: DC

## 2015-07-10 MED ORDER — HYDROCODONE-HOMATROPINE 5-1.5 MG/5ML PO SYRP: 5.0000 mL | ORAL_SOLUTION | Freq: Four times a day (QID) | ORAL | Status: AC | PRN

## 2015-07-10 NOTE — Patient Instructions (Signed)
Take over-the-counter expectorants and cough medications such as  Mucinex DM.  Call if there is no improvement in 5 to 7 days or if  you develop worsening cough, fever, or new symptoms, such as shortness of breath or chest pain.  Chest x-ray as discussed  HOME CARE INSTRUCTIONS  Get plenty of rest.  Drink enough fluids to keep your urine clear or pale yellow (unless you have a medical condition that requires fluid restriction). Increasing fluids may help thin your respiratory secretions (sputum) and reduce chest congestion, and it will prevent dehydration.  Take medicines only as directed by your health care provider.  If you were prescribed an antibiotic medicine, finish it all even if you start to feel better.  Avoid smoking and secondhand smoke. Exposure to cigarette smoke or irritating chemicals will make bronchitis worse. Reduce the chances of another bout of acute bronchitis by washing your hands frequently, avoiding people with cold symptoms, and trying not to touch your hands to your mouth, nose, or eyes.

## 2015-07-10 NOTE — Progress Notes (Signed)
Pre visit review using our clinic review tool, if applicable. No additional management support is needed unless otherwise documented below in the visit note. 

## 2015-07-10 NOTE — Progress Notes (Signed)
Subjective:    Patient ID: Paul Schwartz, male    DOB: 1941-07-21, 74 y.o.   MRN: [id number]  HPI  74 year old patient who presents with a chief complaint of nonproductive cough since August 10.  He was seen 5 days later and treated symptomatically.  He was reevaluated.  5 days ago and treated with azithromycin.  He continues to have refractory cough that interferes with sleep.  He does have a history of GERD but no real reflux symptoms.  No rhinorrhea or upper airway symptoms.  He does have a remote history tobacco use.  Is concerned about pneumonia.  No fever, chills or other constitutional complaints  Past Medical History  Diagnosis Date  . BPH (benign prostatic hyperplasia)   . Vertigo     dizziness  . Renal calculus   . GERD (gastroesophageal reflux disease)   . Arthritis   . Bilateral shoulder pain     Social History   Social History  . Marital Status: Married    Spouse Name: N/A  . Number of Children: N/A  . Years of Education: N/A   Occupational History  . Not on file.   Social History Main Topics  . Smoking status: Former Smoker    Types: Cigarettes    Quit date: 04/07/1968  . Smokeless tobacco: Never Used  . Alcohol Use: 1.8 oz/week    3 Glasses of wine per week  . Drug Use: No  . Sexual Activity: Yes   Other Topics Concern  . Not on file   Social History Narrative    Past Surgical History  Procedure Laterality Date  . Achilles tendon      surgical repair on the right    Family History  Problem Relation Age of Onset  . Heart disease Father     Allergies  Allergen Reactions  . Plasticized Base [Plastibase] Hives and Rash    Current Outpatient Prescriptions on File Prior to Visit  Medication Sig Dispense Refill  . aspirin 81 MG tablet Take 81 mg by mouth 2 (two) times daily.      . cyclobenzaprine (FLEXERIL) 5 MG tablet Take 1 tablet (5 mg total) by mouth 3 (three) times daily as needed for muscle spasms. 30 tablet 1  . diclofenac  (VOLTAREN) 75 MG EC tablet TAKE 1 TABLET TWICE A DAY AFTER MEALS 180 tablet 3  . LORazepam (ATIVAN) 0.5 MG tablet TAKE 1 TABLET 3 TIMES A DAY AS NEEDED FOR ANXIETY 60 tablet 2  . Multiple Vitamin (MULTIVITAMIN) tablet Take 1 tablet by mouth daily.      . NON FORMULARY Zipcor 25 mg; q qid for arthritic pain     . tadalafil (CIALIS) 10 MG tablet Take 1 tablet (10 mg total) by mouth daily as needed for erectile dysfunction. 10 tablet 6  . tamsulosin (FLOMAX) 0.4 MG CAPS capsule Take 1 capsule (0.4 mg total) by mouth daily. To prevent night time urination 90 capsule 5  . traMADol (ULTRAM) 50 MG tablet Take 1 tablet (50 mg total) by mouth every 8 (eight) hours as needed. 30 tablet 0   Current Facility-Administered Medications on File Prior to Visit  Medication Dose Route Frequency Provider Last Rate Last Dose  . 0.9 %  sodium chloride infusion  500 mL Intravenous Continuous Ladene Artist, MD      . methylPREDNISolone acetate (DEPO-MEDROL) injection 120 mg  120 mg Intra-articular Once Hoover Browns., MD        BP 132/90 mmHg  Pulse 79  Temp(Src) 98.4 F (36.9 C) (Oral)  Resp 20  Ht 5\' 7"  (1.702 m)  Wt 190 lb (86.183 kg)  BMI 29.75 kg/m2  SpO2 95%     Review of Systems  Constitutional: Negative for fever, chills, appetite change and fatigue.  HENT: Negative for congestion, dental problem, ear pain, hearing loss, sore throat, tinnitus, trouble swallowing and voice change.   Eyes: Negative for pain, discharge and visual disturbance.  Respiratory: Positive for cough. Negative for chest tightness, wheezing and stridor.   Cardiovascular: Negative for chest pain, palpitations and leg swelling.  Gastrointestinal: Negative for nausea, vomiting, abdominal pain, diarrhea, constipation, blood in stool and abdominal distention.  Genitourinary: Negative for urgency, hematuria, flank pain, discharge, difficulty urinating and genital sores.  Musculoskeletal: Negative for myalgias, back  pain, joint swelling, arthralgias, gait problem and neck stiffness.  Skin: Negative for rash.  Neurological: Negative for dizziness, syncope, speech difficulty, weakness, numbness and headaches.  Hematological: Negative for adenopathy. Does not bruise/bleed easily.  Psychiatric/Behavioral: Negative for behavioral problems and dysphoric mood. The patient is not nervous/anxious.        Objective:   Physical Exam  Constitutional: He is oriented to person, place, and time. He appears well-developed.  HENT:  Head: Normocephalic.  Right Ear: External ear normal.  Left Ear: External ear normal.  Eyes: Conjunctivae and EOM are normal.  Neck: Normal range of motion.  Cardiovascular: Normal rate and normal heart sounds.   Pulmonary/Chest: Breath sounds normal. No respiratory distress. He has no wheezes. He has no rales.  Abdominal: Bowel sounds are normal.  Musculoskeletal: Normal range of motion. He exhibits no edema or tenderness.  Neurological: He is alert and oriented to person, place, and time.  Psychiatric: He has a normal mood and affect. His behavior is normal.          Assessment & Plan:   Viral bronchitis with refractory cough.  Will continue symptomatic treatment with Hycodan.  Will review a chest x-ray.  Will treat with a short course of low intensity prednisone.  We'll start a antireflux regimen including omeprazole for 30 days

## 2015-07-11 ENCOUNTER — Ambulatory Visit (INDEPENDENT_AMBULATORY_CARE_PROVIDER_SITE_OTHER)
Admission: RE | Admit: 2015-07-11 | Discharge: 2015-07-11 | Disposition: A | Discharge status: Home / Self Care | Payer: Medicare Other | Source: Ambulatory Visit | Attending: Internal Medicine | Admitting: Internal Medicine

## 2015-07-11 DIAGNOSIS — R05 Cough: Secondary | ICD-10-CM | POA: Diagnosis not present

## 2015-07-11 DIAGNOSIS — R053 Chronic cough: Secondary | ICD-10-CM

## 2015-08-20 DIAGNOSIS — H2511 Age-related nuclear cataract, right eye: Secondary | ICD-10-CM | POA: Diagnosis not present

## 2015-08-20 DIAGNOSIS — H5200 Hypermetropia, unspecified eye: Secondary | ICD-10-CM | POA: Diagnosis not present

## 2015-08-20 DIAGNOSIS — H40023 Open angle with borderline findings, high risk, bilateral: Secondary | ICD-10-CM | POA: Diagnosis not present

## 2015-08-20 DIAGNOSIS — H40033 Anatomical narrow angle, bilateral: Secondary | ICD-10-CM | POA: Diagnosis not present

## 2015-08-20 DIAGNOSIS — H353130 Nonexudative age-related macular degeneration, bilateral, stage unspecified: Secondary | ICD-10-CM | POA: Diagnosis not present

## 2015-09-18 DIAGNOSIS — H2511 Age-related nuclear cataract, right eye: Secondary | ICD-10-CM | POA: Diagnosis not present

## 2015-09-18 DIAGNOSIS — H538 Other visual disturbances: Secondary | ICD-10-CM | POA: Diagnosis not present

## 2015-10-10 DIAGNOSIS — M170 Bilateral primary osteoarthritis of knee: Secondary | ICD-10-CM | POA: Diagnosis not present

## 2015-10-10 DIAGNOSIS — M1711 Unilateral primary osteoarthritis, right knee: Secondary | ICD-10-CM | POA: Diagnosis not present

## 2015-10-10 DIAGNOSIS — M1712 Unilateral primary osteoarthritis, left knee: Secondary | ICD-10-CM | POA: Diagnosis not present

## 2015-10-16 ENCOUNTER — Ambulatory Visit (INDEPENDENT_AMBULATORY_CARE_PROVIDER_SITE_OTHER): Payer: Medicare Other | Admitting: Family Medicine

## 2015-10-16 DIAGNOSIS — Z23 Encounter for immunization: Secondary | ICD-10-CM

## 2015-10-26 ENCOUNTER — Other Ambulatory Visit (INDEPENDENT_AMBULATORY_CARE_PROVIDER_SITE_OTHER): Payer: Medicare Other

## 2015-10-26 DIAGNOSIS — N400 Enlarged prostate without lower urinary tract symptoms: Secondary | ICD-10-CM | POA: Diagnosis not present

## 2015-10-26 DIAGNOSIS — Z79899 Other long term (current) drug therapy: Secondary | ICD-10-CM

## 2015-10-26 DIAGNOSIS — Z0000 Encounter for general adult medical examination without abnormal findings: Secondary | ICD-10-CM | POA: Diagnosis not present

## 2015-10-26 DIAGNOSIS — I10 Essential (primary) hypertension: Secondary | ICD-10-CM | POA: Diagnosis not present

## 2015-10-26 LAB — HEPATIC FUNCTION PANEL
ALT: 21 U/L (ref 0–53)
AST: 24 U/L (ref 0–37)
Albumin: 3.8 g/dL (ref 3.5–5.2)
Alkaline Phosphatase: 46 U/L (ref 39–117)
BILIRUBIN TOTAL: 0.7 mg/dL (ref 0.2–1.2)
Bilirubin, Direct: 0.1 mg/dL (ref 0.0–0.3)
Total Protein: 6.1 g/dL (ref 6.0–8.3)

## 2015-10-26 LAB — POCT URINALYSIS DIPSTICK
BILIRUBIN UA: NEGATIVE
Glucose, UA: NEGATIVE
Ketones, UA: NEGATIVE
Leukocytes, UA: NEGATIVE
NITRITE UA: NEGATIVE
PH UA: 6
Protein, UA: NEGATIVE
RBC UA: NEGATIVE
Spec Grav, UA: 1.025
UROBILINOGEN UA: 0.2

## 2015-10-26 LAB — BASIC METABOLIC PANEL
BUN: 20 mg/dL (ref 6–23)
CALCIUM: 9.2 mg/dL (ref 8.4–10.5)
CO2: 31 mEq/L (ref 19–32)
Chloride: 105 mEq/L (ref 96–112)
Creatinine, Ser: 0.95 mg/dL (ref 0.40–1.50)
GFR: 82.04 mL/min (ref >60.00)
GLUCOSE: 89 mg/dL (ref 70–99)
Potassium: 3.8 mEq/L (ref 3.5–5.1)
SODIUM: 142 meq/L (ref 135–145)

## 2015-10-26 LAB — CBC WITH DIFFERENTIAL/PLATELET
BASOS ABS: 0 10*3/uL (ref 0.0–0.1)
Basophils Relative: 0.5 % (ref 0.0–3.0)
Eosinophils Absolute: 0.1 10*3/uL (ref 0.0–0.7)
Eosinophils Relative: 2.2 % (ref 0.0–5.0)
HCT: 43.3 % (ref 39.0–52.0)
Hemoglobin: 14.7 g/dL (ref 13.0–17.0)
LYMPHS ABS: 1.3 10*3/uL (ref 0.7–4.0)
Lymphocytes Relative: 23.6 % (ref 12.0–46.0)
MCHC: 33.9 g/dL (ref 30.0–36.0)
MCV: 94.8 fl (ref 78.0–100.0)
MONO ABS: 0.3 10*3/uL (ref 0.1–1.0)
MONOS PCT: 5.8 % (ref 3.0–12.0)
NEUTROS PCT: 67.9 % (ref 43.0–77.0)
Neutro Abs: 3.9 10*3/uL (ref 1.4–7.7)
Platelets: 184 10*3/uL (ref 150.0–400.0)
RBC: 4.57 Mil/uL (ref 4.22–5.81)
RDW: 13.6 % (ref 11.5–15.5)
WBC: 5.7 10*3/uL (ref 4.0–10.5)

## 2015-10-26 LAB — LIPID PANEL
CHOL/HDL RATIO: 4
CHOLESTEROL: 189 mg/dL (ref 0–200)
HDL: 47.8 mg/dL (ref >39.00)
LDL Cholesterol: 123 mg/dL — ABNORMAL HIGH (ref 0–99)
NonHDL: 141.16
TRIGLYCERIDES: 92 mg/dL (ref 0.0–149.0)
VLDL: 18.4 mg/dL (ref 0.0–40.0)

## 2015-10-26 LAB — PSA: PSA: 4.96 ng/mL — AB (ref 0.10–4.00)

## 2015-10-26 LAB — TSH: TSH: 1.47 u[IU]/mL (ref 0.35–4.50)

## 2015-11-02 ENCOUNTER — Encounter: Payer: Self-pay | Admitting: Internal Medicine

## 2015-11-02 ENCOUNTER — Ambulatory Visit (INDEPENDENT_AMBULATORY_CARE_PROVIDER_SITE_OTHER): Payer: Medicare Other | Admitting: Internal Medicine

## 2015-11-02 VITALS — BP 140/80 | HR 92 | Temp 98.6°F | Resp 20 | Ht 67.0 in | Wt 190.0 lb

## 2015-11-02 DIAGNOSIS — Z0000 Encounter for general adult medical examination without abnormal findings: Secondary | ICD-10-CM | POA: Diagnosis not present

## 2015-11-02 DIAGNOSIS — M129 Arthropathy, unspecified: Secondary | ICD-10-CM

## 2015-11-02 DIAGNOSIS — K219 Gastro-esophageal reflux disease without esophagitis: Secondary | ICD-10-CM

## 2015-11-02 DIAGNOSIS — I10 Essential (primary) hypertension: Secondary | ICD-10-CM

## 2015-11-02 DIAGNOSIS — Z86010 Personal history of colonic polyps: Secondary | ICD-10-CM

## 2015-11-02 MED ORDER — LEVOFLOXACIN 500 MG PO TABS: 500.0000 mg | ORAL_TABLET | Freq: Every day | ORAL | Status: DC

## 2015-11-02 NOTE — Patient Instructions (Signed)
Limit your sodium (Salt) intake  Please check your blood pressure on a regular basis.  If it is consistently greater than 150/90, please make an office appointment.  Return in 6 months for follow-up  Health Maintenance, Male A healthy lifestyle and preventative care can promote health and wellness.  Maintain regular health, dental, and eye exams.  Eat a healthy diet. Foods like vegetables, fruits, whole grains, low-fat dairy products, and lean protein foods contain the nutrients you need and are low in calories. Decrease your intake of foods high in solid fats, added sugars, and salt. Get information about a proper diet from your health care provider, if necessary.  Regular physical exercise is one of the most important things you can do for your health. Most adults should get at least 150 minutes of moderate-intensity exercise (any activity that increases your heart rate and causes you to sweat) each week. In addition, most adults need muscle-strengthening exercises on 2 or more days a week.   Maintain a healthy weight. The body mass index (BMI) is a screening tool to identify possible weight problems. It provides an estimate of body fat based on height and weight. Your health care provider can find your BMI and can help you achieve or maintain a healthy weight. For males 20 years and older:  A BMI below 18.5 is considered underweight.  A BMI of 18.5 to 24.9 is normal.  A BMI of 25 to 29.9 is considered overweight.  A BMI of 30 and above is considered obese.  Maintain normal blood lipids and cholesterol by exercising and minimizing your intake of saturated fat. Eat a balanced diet with plenty of fruits and vegetables. Blood tests for lipids and cholesterol should begin at age 73 and be repeated every 5 years. If your lipid or cholesterol levels are high, you are over age 44, or you are at high risk for heart disease, you may need your cholesterol levels checked more frequently.Ongoing  high lipid and cholesterol levels should be treated with medicines if diet and exercise are not working.  If you smoke, find out from your health care provider how to quit. If you do not use tobacco, do not start.  Lung cancer screening is recommended for adults aged 25-80 years who are at high risk for developing lung cancer because of a history of smoking. A yearly low-dose CT scan of the lungs is recommended for people who have at least a 30-pack-year history of smoking and are current smokers or have quit within the past 15 years. A pack year of smoking is smoking an average of 1 pack of cigarettes a day for 1 year (for example, a 30-pack-year history of smoking could mean smoking 1 pack a day for 30 years or 2 packs a day for 15 years). Yearly screening should continue until the smoker has stopped smoking for at least 15 years. Yearly screening should be stopped for people who develop a health problem that would prevent them from having lung cancer treatment.  If you choose to drink alcohol, do not have more than 2 drinks per day. One drink is considered to be 12 oz (360 mL) of beer, 5 oz (150 mL) of wine, or 1.5 oz (45 mL) of liquor.  Avoid the use of street drugs. Do not share needles with anyone. Ask for help if you need support or instructions about stopping the use of drugs.  High blood pressure causes heart disease and increases the risk of stroke. High blood pressure  is more likely to develop in:  People who have blood pressure in the end of the normal range (100-139/85-89 mm Hg).  People who are overweight or obese.  People who are African American.  If you are 68-69 years of age, have your blood pressure checked every 3-5 years. If you are 68 years of age or older, have your blood pressure checked every year. You should have your blood pressure measured twice--once when you are at a hospital or clinic, and once when you are not at a hospital or clinic. Record the average of the two  measurements. To check your blood pressure when you are not at a hospital or clinic, you can use:  An automated blood pressure machine at a pharmacy.  A home blood pressure monitor.  If you are 27-50 years old, ask your health care provider if you should take aspirin to prevent heart disease.  Diabetes screening involves taking a blood sample to check your fasting blood sugar level. This should be done once every 3 years after age 59 if you are at a normal weight and without risk factors for diabetes. Testing should be considered at a younger age or be carried out more frequently if you are overweight and have at least 1 risk factor for diabetes.  Colorectal cancer can be detected and often prevented. Most routine colorectal cancer screening begins at the age of 65 and continues through age 40. However, your health care provider may recommend screening at an earlier age if you have risk factors for colon cancer. On a yearly basis, your health care provider may provide home test kits to check for hidden blood in the stool. A small camera at the end of a tube may be used to directly examine the colon (sigmoidoscopy or colonoscopy) to detect the earliest forms of colorectal cancer. Talk to your health care provider about this at age 77 when routine screening begins. A direct exam of the colon should be repeated every 5-10 years through age 28, unless early forms of precancerous polyps or small growths are found.  People who are at an increased risk for hepatitis B should be screened for this virus. You are considered at high risk for hepatitis B if:  You were born in a country where hepatitis B occurs often. Talk with your health care provider about which countries are considered high risk.  Your parents were born in a high-risk country and you have not received a shot to protect against hepatitis B (hepatitis B vaccine).  You have HIV or AIDS.  You use needles to inject street drugs.  You live  with, or have sex with, someone who has hepatitis B.  You are a man who has sex with other men (MSM).  You get hemodialysis treatment.  You take certain medicines for conditions like cancer, organ transplantation, and autoimmune conditions.  Hepatitis C blood testing is recommended for all people born from [birth year] through 1966 and any individual with known risk factors for hepatitis C.  Healthy men should no longer receive prostate-specific antigen (PSA) blood tests as part of routine cancer screening. Talk to your health care provider about prostate cancer screening.  Testicular cancer screening is not recommended for adolescents or adult males who have no symptoms. Screening includes self-exam, a health care provider exam, and other screening tests. Consult with your health care provider about any symptoms you have or any concerns you have about testicular cancer.  Practice safe sex. Use condoms and avoid high-risk  sexual practices to reduce the spread of sexually transmitted infections (STIs).  You should be screened for STIs, including gonorrhea and chlamydia if:  You are sexually active and are younger than 24 years.  You are older than 24 years, and your health care provider tells you that you are at risk for this type of infection.  Your sexual activity has changed since you were last screened, and you are at an increased risk for chlamydia or gonorrhea. Ask your health care provider if you are at risk.  If you are at risk of being infected with HIV, it is recommended that you take a prescription medicine daily to prevent HIV infection. This is called pre-exposure prophylaxis (PrEP). You are considered at risk if:  You are a man who has sex with other men (MSM).  You are a heterosexual man who is sexually active with multiple partners.  You take drugs by injection.  You are sexually active with a partner who has HIV.  Talk with your health care provider about whether you are at  high risk of being infected with HIV. If you choose to begin PrEP, you should first be tested for HIV. You should then be tested every 3 months for as long as you are taking PrEP.  Use sunscreen. Apply sunscreen liberally and repeatedly throughout the day. You should seek shade when your shadow is shorter than you. Protect yourself by wearing long sleeves, pants, a wide-brimmed hat, and sunglasses year round whenever you are outdoors.  Tell your health care provider of new moles or changes in moles, especially if there is a change in shape or color. Also, tell your health care provider if a mole is larger than the size of a pencil eraser.  A one-time screening for abdominal aortic aneurysm (AAA) and surgical repair of large AAAs by ultrasound is recommended for men aged 75-75 years who are current or former smokers.  Stay current with your vaccines (immunizations).   This information is not intended to replace advice given to you by your health care provider. Make sure you discuss any questions you have with your health care provider.   Document Released: 04/24/2008 Document Revised: 11/17/2014 Document Reviewed: 03/24/2011 Elsevier Interactive Patient Education [employer].

## 2015-11-02 NOTE — Progress Notes (Signed)
Pre visit review using our clinic review tool, if applicable. No additional management support is needed unless otherwise documented below in the visit note. 

## 2015-11-02 NOTE — Progress Notes (Signed)
Patient ID: Paul Schwartz, male   DOB: 1941/02/09, 74 y.o.   MRN: [id number]  Subjective:    Patient ID: Paul Schwartz, male    DOB: February 24, 1941, 74 y.o.   MRN: [id number]  HPI 74  year-old patient who is seen today for a wellness exam.  He is a former patient of Dr. Joni Fears. Medical problems include a history of colonic polyps;  he had a followup colonoscopy in June of 2012;   he has obesity erectile dysfunction and a history of hypertension. Presently he is not requiring medication. He has been evaluated for a possible cervical radiculopathy and has had nerve conduction studies. He has a history of BPH remote tobacco use and a history of hypogonadism. Medical regimen reviewed. Laboratory studies were reviewed and revealed a slight elevated PSA   Past Medical History  Diagnosis Date  . BPH (benign prostatic hyperplasia)   . Vertigo     dizziness  . Renal calculus   . GERD (gastroesophageal reflux disease)   . Arthritis   . Bilateral shoulder pain     Social History   Social History  . Marital Status: Married    Spouse Name: N/A  . Number of Children: N/A  . Years of Education: N/A   Occupational History  . Not on file.   Social History Main Topics  . Smoking status: Former Smoker    Types: Cigarettes    Quit date: 04/07/1968  . Smokeless tobacco: Never Used  . Alcohol Use: 1.8 oz/week    3 Glasses of wine per week  . Drug Use: No  . Sexual Activity: Yes   Other Topics Concern  . Not on file   Social History Narrative    Past Surgical History  Procedure Laterality Date  . Achilles tendon      surgical repair on the right    Family History  Problem Relation Age of Onset  . Heart disease Father     Allergies  Allergen Reactions  . Plasticized Base [Plastibase] Hives and Rash    Current Outpatient Prescriptions on File Prior to Visit  Medication Sig Dispense Refill  . aspirin 81 MG tablet Take 81 mg by mouth 2 (two) times daily.      .  cyclobenzaprine (FLEXERIL) 5 MG tablet Take 1 tablet (5 mg total) by mouth 3 (three) times daily as needed for muscle spasms. 30 tablet 1  . diclofenac (VOLTAREN) 75 MG EC tablet TAKE 1 TABLET TWICE A DAY AFTER MEALS 180 tablet 3  . LORazepam (ATIVAN) 0.5 MG tablet TAKE 1 TABLET 3 TIMES A DAY AS NEEDED FOR ANXIETY 60 tablet 2  . Multiple Vitamin (MULTIVITAMIN) tablet Take 1 tablet by mouth daily.      . NON FORMULARY Zipcor 25 mg; q qid for arthritic pain     . predniSONE (DELTASONE) 10 MG tablet Take 1 tablet (10 mg total) by mouth 2 (two) times daily with a meal. 14 tablet 0  . tadalafil (CIALIS) 10 MG tablet Take 1 tablet (10 mg total) by mouth daily as needed for erectile dysfunction. 10 tablet 6  . tamsulosin (FLOMAX) 0.4 MG CAPS capsule Take 1 capsule (0.4 mg total) by mouth daily. To prevent night time urination 90 capsule 5  . traMADol (ULTRAM) 50 MG tablet Take 1 tablet (50 mg total) by mouth every 8 (eight) hours as needed. 30 tablet 0   Current Facility-Administered Medications on File Prior to Visit  Medication Dose Route Frequency Provider Last Rate  Last Dose  . 0.9 %  sodium chloride infusion  500 mL Intravenous Continuous Ladene Artist, MD      . methylPREDNISolone acetate (DEPO-MEDROL) injection 120 mg  120 mg Intra-articular Once Hoover Browns., MD        There were no vitals taken for this visit.  1. Risk factors, based on past  M,S,F history- cardiovascular risk factors include a history of hypertension. Presently controlled without medications  2.  Physical activities: No acute restrictions does have some significant arthritis but this involves primarily shoulder and neck region  3.  Depression/mood: No history depression or mood disorder  4.  Hearing: No deficits. Has seen Dr. Erik Obey in the past  5.  ADL's: Independent in all aspects of daily living 6.  Fall risk: Low  7.  Home safety: No problems identified  8.  Height weight, and visual acuity;  height and weight stable no visual deficits  9.  Counseling: We'll regular exercise modest weight loss encouraged  10. Lab orders based on risk factors: Laboratory profile will be reviewed  11. Referral : Followup orthopedic  12. Care plan: Orthopedic followup followup nerve conduction studies;  followup colonoscopy in 4 years.  Will recheck in 4 months and follow-up with PSA total and free.  Urology referral.  Discussed  42. Cognitive assessment: Alert and oriented normal affect. No cognitive dysfunction  14.  Preventive services will include an annual health examination with screening lab.  He will have colonoscopies every 5 years in view of his history of colonic polyps.  Patient was provided with a written and personalized care plan  15.  Provider list includes primary care orthopedics and GI        Review of Systems  Constitutional: Negative for fever, chills, appetite change and fatigue.  HENT: Negative for congestion, dental problem, ear pain, hearing loss, sore throat, tinnitus, trouble swallowing and voice change.   Eyes: Negative for pain, discharge and visual disturbance.  Respiratory: Negative for cough, chest tightness, wheezing and stridor.   Cardiovascular: Negative for chest pain, palpitations and leg swelling.  Gastrointestinal: Negative for nausea, vomiting, abdominal pain, diarrhea, constipation, blood in stool and abdominal distention.  Genitourinary: Positive for urgency and frequency. Negative for hematuria, flank pain, discharge, difficulty urinating and genital sores.  Musculoskeletal: Negative for myalgias, back pain, joint swelling, gait problem and neck stiffness. Arthralgias: Shoulder and neck pain.  Skin: Negative for rash.  Neurological: Negative for dizziness, syncope, speech difficulty, weakness, numbness and headaches.  Hematological: Negative for adenopathy. Does not bruise/bleed easily.  Psychiatric/Behavioral: Negative for behavioral problems and  dysphoric mood. The patient is not nervous/anxious.        Objective:   Physical Exam  Constitutional: He appears well-developed and well-nourished.  Blood pressure 140/80 Weight 197  HENT:  Head: Normocephalic and atraumatic.  Right Ear: External ear normal.  Left Ear: External ear normal.  Nose: Nose normal.  Mouth/Throat: Oropharynx is clear and moist.  Eyes: Conjunctivae and EOM are normal. Pupils are equal, round, and reactive to light. No scleral icterus.  Neck: Normal range of motion. Neck supple. No JVD present. No thyromegaly present.     Cardiovascular: Regular rhythm and intact distal pulses.  Exam reveals no gallop and no friction rub.   Murmur heard. Grade 2/6 systolic murmur Pedal pulses not easily palpable except for a full right dorsalis pedis pulse  Pulmonary/Chest: Effort normal and breath sounds normal. He exhibits no tenderness.  Abdominal: Soft. Bowel sounds are  normal. He exhibits no distension and no mass. There is no tenderness.  Genitourinary: Penis normal. Guaiac negative stool.  Prostate plus 3 on large smooth and symmetrical.  Small nodule, right superior lobe   Musculoskeletal: Normal range of motion. He exhibits no edema or tenderness.  Lymphadenopathy:    He has no cervical adenopathy.  Neurological: He is alert. He has normal reflexes. No cranial nerve deficit. Coordination normal.  Skin: Skin is warm and dry. No rash noted.  Psychiatric: He has a normal mood and affect. His behavior is normal.          Assessment & Plan:   Exogenous obesity.  Weight loss encouraged  History of hypertension. Currently stable off medication Osteoarthritis. Followup orthopedic. Followup nerve conduction studies BPH /elevated PSA.  Small prostatic nodule.  Options discussed including urology referral..  PSA is slightly improved.  We'll continue to follow clinically    History of colonic polyps followup colonoscopy in  1years  Medical regimen unchanged

## 2015-12-07 ENCOUNTER — Other Ambulatory Visit: Payer: Self-pay | Admitting: Internal Medicine

## 2016-02-19 ENCOUNTER — Encounter: Payer: Self-pay | Admitting: Gastroenterology

## 2016-02-26 ENCOUNTER — Encounter: Payer: Self-pay | Admitting: Gastroenterology

## 2016-03-20 ENCOUNTER — Ambulatory Visit (INDEPENDENT_AMBULATORY_CARE_PROVIDER_SITE_OTHER): Payer: Medicare Other | Admitting: Family Medicine

## 2016-03-20 ENCOUNTER — Encounter: Payer: Self-pay | Admitting: Family Medicine

## 2016-03-20 VITALS — BP 130/85 | HR 84 | Temp 98.7°F | Resp 12 | Ht 67.0 in | Wt 184.0 lb

## 2016-03-20 DIAGNOSIS — R062 Wheezing: Secondary | ICD-10-CM

## 2016-03-20 DIAGNOSIS — J209 Acute bronchitis, unspecified: Secondary | ICD-10-CM | POA: Diagnosis not present

## 2016-03-20 MED ORDER — IPRATROPIUM-ALBUTEROL 0.5-2.5 (3) MG/3ML IN SOLN: 3.0000 mL | Freq: Once | RESPIRATORY_TRACT | Status: DC

## 2016-03-20 MED ORDER — ALBUTEROL SULFATE HFA 108 (90 BASE) MCG/ACT IN AERS: 2.0000 | INHALATION_SPRAY | Freq: Four times a day (QID) | RESPIRATORY_TRACT | Status: DC | PRN

## 2016-03-20 MED ORDER — BENZONATATE 100 MG PO CAPS: 200.0000 mg | ORAL_CAPSULE | Freq: Two times a day (BID) | ORAL | Status: DC | PRN

## 2016-03-20 MED ORDER — PREDNISONE 20 MG PO TABS: 40.0000 mg | ORAL_TABLET | Freq: Every day | ORAL | Status: DC

## 2016-03-20 NOTE — Progress Notes (Signed)
Subjective:    Patient ID: Paul Schwartz, male    DOB: June 19, 1941, 75 y.o.   MRN: [id number]  HPI   Paul Schwartz is a 75 y.o.male here today complaining of 3-4 days of respiratory symptoms.  + Productive cough, not able to bring sputum up, wheezing. No dyspnea or chest pain. He has had similar symptoms in the past, requiring oral Prednisone.  + Former smoker, quit about 40 years ago. "A little" rhinorrhea and nasal congestion. He denies any fever, chills, fatigue, or myalgias.  No Hx of recent travel. + sick contact: granddaughter was sick recently with URI. No known insect bite. No Hx of allergies.  He has tried OTC cough syrup.  Symptoms otherwise stable.   Review of Systems  Constitutional: Negative for fever, chills, activity change, appetite change and fatigue.  HENT: Positive for congestion and rhinorrhea. Negative for ear pain, mouth sores, postnasal drip, sneezing, sore throat, trouble swallowing and voice change.   Eyes: Negative for discharge, redness and itching.  Respiratory: Positive for cough and wheezing. Negative for chest tightness and shortness of breath.   Cardiovascular: Negative for chest pain, palpitations and leg swelling.  Gastrointestinal: Negative for nausea, vomiting, abdominal pain and diarrhea.  Musculoskeletal: Negative for myalgias and neck pain.  Skin: Negative for color change and rash.  Neurological: Negative for syncope, weakness and headaches.  Hematological: Negative for adenopathy. Does not bruise/bleed easily.     Current Outpatient Prescriptions on File Prior to Visit  Medication Sig Dispense Refill  . aspirin 81 MG tablet Take 81 mg by mouth 2 (two) times daily.      . cyclobenzaprine (FLEXERIL) 5 MG tablet Take 1 tablet (5 mg total) by mouth 3 (three) times daily as needed for muscle spasms. 30 tablet 1  . diclofenac (VOLTAREN) 75 MG EC tablet TAKE 1 TABLET TWICE A DAY AFTER MEALS 180 tablet 3  . LORazepam  (ATIVAN) 1 MG tablet TAKE 1 TABLET 3 TIMES A DAY AS NEEDED FOR ANXIETY 60 tablet 2  . Multiple Vitamin (MULTIVITAMIN) tablet Take 1 tablet by mouth daily.      . NON FORMULARY Zipcor 25 mg; q qid for arthritic pain     . tadalafil (CIALIS) 10 MG tablet Take 1 tablet (10 mg total) by mouth daily as needed for erectile dysfunction. 10 tablet 6  . tamsulosin (FLOMAX) 0.4 MG CAPS capsule Take 1 capsule (0.4 mg total) by mouth daily. To prevent night time urination 90 capsule 5  . traMADol (ULTRAM) 50 MG tablet Take 1 tablet (50 mg total) by mouth every 8 (eight) hours as needed. 30 tablet 0   Current Facility-Administered Medications on File Prior to Visit  Medication Dose Route Frequency Provider Last Rate Last Dose  . 0.9 %  sodium chloride infusion  500 mL Intravenous Continuous Ladene Artist, MD         Past Medical History  Diagnosis Date  . BPH (benign prostatic hyperplasia)   . Vertigo     dizziness  . Renal calculus   . GERD (gastroesophageal reflux disease)   . Arthritis   . Bilateral shoulder pain     Social History   Social History  . Marital Status: Married    Spouse Name: N/A  . Number of Children: N/A  . Years of Education: N/A   Social History Main Topics  . Smoking status: Former Smoker    Types: Cigarettes    Quit date: 04/07/1968  . Smokeless tobacco:  Never Used  . Alcohol Use: 1.8 oz/week    3 Glasses of wine per week  . Drug Use: No  . Sexual Activity: Yes   Other Topics Concern  . None   Social History Narrative    Filed Vitals:   03/20/16 1553  BP: 130/85  Pulse: 84  Temp: 98.7 F (37.1 C)  Resp: 12   Body mass index is 28.81 kg/(m^2).  SpO2 Readings from Last 3 Encounters:  03/20/16 98%  11/02/15 96%  07/10/15 95%       Objective:   Physical Exam  Constitutional: He is oriented to person, place, and time. He appears well-developed and well-nourished. He does not appear ill. No distress.  HENT:  Head: Normocephalic.  Right Ear:  External ear normal.  Left Ear: External ear normal.  Nose: Septal deviation present. No rhinorrhea. Right sinus exhibits no maxillary sinus tenderness and no frontal sinus tenderness. Left sinus exhibits no maxillary sinus tenderness and no frontal sinus tenderness.  Mouth/Throat: Uvula is midline, oropharynx is clear and moist and mucous membranes are normal. No oral lesions. No oropharyngeal exudate.  Nasal voice.  Eyes: Conjunctivae are normal. Right eye exhibits no discharge. Left eye exhibits no discharge.  Cardiovascular: Normal rate and regular rhythm.   No murmur heard. Pulmonary/Chest: Effort normal. No respiratory distress. He has wheezes. He has rhonchi. He has no rales.  Abdominal: Soft. He exhibits no distension and no mass. There is no tenderness.  Lymphadenopathy:    He has no cervical adenopathy.  Neurological: He is alert and oriented to person, place, and time. Coordination and gait normal.  Skin: Skin is warm. No rash noted.  Psychiatric: He has a normal mood and affect. His speech is normal.  Well groomed, good eye contact.  Nursing note and vitals reviewed.      Assessment & Plan:     Paul Schwartz was seen today for cough and chest congestion.  Diagnoses and all orders for this visit:  Wheezing on auscultation -     ipratropium-albuterol (DUONEB) 0.5-2.5 (3) MG/3ML nebulizer solution 3 mL; Take 3 mLs by nebulization once. -     predniSONE (DELTASONE) 20 MG tablet; Take 2 tablets (40 mg total) by mouth daily with breakfast. -     albuterol (PROVENTIL HFA;VENTOLIN HFA) 108 (90 Base) MCG/ACT inhaler; Inhale 2 puffs into the lungs every 6 (six) hours as needed for wheezing or shortness of breath. -     benzonatate (TESSALON) 100 MG capsule; Take 2 capsules (200 mg total) by mouth 2 (two) times daily as needed for cough.  Acute bronchitis, unspecified organism -     albuterol (PROVENTIL HFA;VENTOLIN HFA) 108 (90 Base) MCG/ACT inhaler; Inhale 2 puffs into the lungs every  6 (six) hours as needed for wheezing or shortness of breath. -     benzonatate (TESSALON) 100 MG capsule; Take 2 capsules (200 mg total) by mouth 2 (two) times daily as needed for cough.  ? COPD. After Duoneb neb treatment diffuse rhonchi and wheezing improved, no rales appreciated; tolerated treatment well. Explained that symptoms are most likely viral, therefore symptomatic treatment is recommended for now. I do not think imaging is needed today. Instructed to monitor for complications. If fever he must let me know because abx will be recommended in that case. Side effects of Prednisone and Albuterol discussed. Caution with cold meds OTC. Instructed clearly about warning signs. F/U in 1-2 weeks.   -Patient advised to return or notify a doctor immediately if symptoms  worsen or persist or new concerns arise.    Paul G. Martinique, MD  [facility]. [place] office.

## 2016-03-20 NOTE — Patient Instructions (Signed)
A few things to remember from today's visit:   1. Wheezing on auscultation  - ipratropium-albuterol (DUONEB) 0.5-2.5 (3) MG/3ML nebulizer solution 3 mL; Take 3 mLs by nebulization once. - predniSONE (DELTASONE) 20 MG tablet; Take 2 tablets (40 mg total) by mouth daily with breakfast.  Dispense: 6 tablet; Refill: 0 - albuterol (PROVENTIL HFA;VENTOLIN HFA) 108 (90 Base) MCG/ACT inhaler; Inhale 2 puffs into the lungs every 6 (six) hours as needed for wheezing or shortness of breath.  Dispense: 1 Inhaler; Refill: 1 - benzonatate (TESSALON) 100 MG capsule; Take 2 capsules (200 mg total) by mouth 2 (two) times daily as needed for cough.  Dispense: 45 capsule; Refill: 0  2. Acute bronchitis, unspecified organism  - albuterol (PROVENTIL HFA;VENTOLIN HFA) 108 (90 Base) MCG/ACT inhaler; Inhale 2 puffs into the lungs every 6 (six) hours as needed for wheezing or shortness of breath.  Dispense: 1 Inhaler; Refill: 1 - benzonatate (TESSALON) 100 MG capsule; Take 2 capsules (200 mg total) by mouth 2 (two) times daily as needed for cough.  Dispense: 45 capsule; Refill: 0    Albuterol inh 2 puff every 6 hours for a week then as needed. Start Prednisone tomorrow.  If fever, difficulty breathing , chest pain, or worsening symptoms seek immediate medical attention. Schedule appt in 10-14 days for follow up.

## 2016-03-20 NOTE — Progress Notes (Signed)
Pre visit review using our clinic review tool, if applicable. No additional management support is needed unless otherwise documented below in the visit note. 

## 2016-03-25 ENCOUNTER — Encounter: Payer: Self-pay | Admitting: Internal Medicine

## 2016-03-25 ENCOUNTER — Ambulatory Visit (INDEPENDENT_AMBULATORY_CARE_PROVIDER_SITE_OTHER): Payer: Medicare Other | Admitting: Internal Medicine

## 2016-03-25 VITALS — BP 140/80 | HR 79 | Temp 98.4°F | Resp 20 | Ht 67.0 in | Wt 187.0 lb

## 2016-03-25 DIAGNOSIS — I10 Essential (primary) hypertension: Secondary | ICD-10-CM | POA: Diagnosis not present

## 2016-03-25 DIAGNOSIS — B9789 Other viral agents as the cause of diseases classified elsewhere: Principal | ICD-10-CM

## 2016-03-25 DIAGNOSIS — J069 Acute upper respiratory infection, unspecified: Secondary | ICD-10-CM

## 2016-03-25 NOTE — Progress Notes (Signed)
Pre visit review using our clinic review tool, if applicable. No additional management support is needed unless otherwise documented below in the visit note. 

## 2016-03-25 NOTE — Progress Notes (Signed)
Subjective:    Patient ID: Paul Schwartz, male    DOB: 1940-12-25, 75 y.o.   MRN: [id number]  HPI  75 year old patient who is seen today in follow-up.  He was seen 5 days ago and treated for suspected viral asthmatic bronchitis.  He has improved nicely and has not had any further wheezing.  He still has some residual cough which also seems improved.  It seems his main concern is a fear of pneumonia.  Denies any fever, chest pain or shortness of breath.  Past Medical History  Diagnosis Date  . BPH (benign prostatic hyperplasia)   . Vertigo     dizziness  . Renal calculus   . GERD (gastroesophageal reflux disease)   . Arthritis   . Bilateral shoulder pain      Social History   Social History  . Marital Status: Married    Spouse Name: N/A  . Number of Children: N/A  . Years of Education: N/A   Occupational History  . Not on file.   Social History Main Topics  . Smoking status: Former Smoker    Types: Cigarettes    Quit date: 04/07/1968  . Smokeless tobacco: Never Used  . Alcohol Use: 1.8 oz/week    3 Glasses of wine per week  . Drug Use: No  . Sexual Activity: Yes   Other Topics Concern  . Not on file   Social History Narrative    Past Surgical History  Procedure Laterality Date  . Achilles tendon      surgical repair on the right    Family History  Problem Relation Age of Onset  . Heart disease Father     Allergies  Allergen Reactions  . Plasticized Base [Plastibase] Hives and Rash    Current Outpatient Prescriptions on File Prior to Visit  Medication Sig Dispense Refill  . albuterol (PROVENTIL HFA;VENTOLIN HFA) 108 (90 Base) MCG/ACT inhaler Inhale 2 puffs into the lungs every 6 (six) hours as needed for wheezing or shortness of breath. 1 Inhaler 1  . aspirin 81 MG tablet Take 81 mg by mouth 2 (two) times daily.      . benzonatate (TESSALON) 100 MG capsule Take 2 capsules (200 mg total) by mouth 2 (two) times daily as needed for cough. 45 capsule  0  . cyclobenzaprine (FLEXERIL) 5 MG tablet Take 1 tablet (5 mg total) by mouth 3 (three) times daily as needed for muscle spasms. 30 tablet 1  . diclofenac (VOLTAREN) 75 MG EC tablet TAKE 1 TABLET TWICE A DAY AFTER MEALS 180 tablet 3  . LORazepam (ATIVAN) 1 MG tablet TAKE 1 TABLET 3 TIMES A DAY AS NEEDED FOR ANXIETY 60 tablet 2  . Multiple Vitamin (MULTIVITAMIN) tablet Take 1 tablet by mouth daily.      . NON FORMULARY Zipcor 25 mg; q qid for arthritic pain     . tadalafil (CIALIS) 10 MG tablet Take 1 tablet (10 mg total) by mouth daily as needed for erectile dysfunction. 10 tablet 6  . tamsulosin (FLOMAX) 0.4 MG CAPS capsule Take 1 capsule (0.4 mg total) by mouth daily. To prevent night time urination 90 capsule 5  . traMADol (ULTRAM) 50 MG tablet Take 1 tablet (50 mg total) by mouth every 8 (eight) hours as needed. 30 tablet 0   Current Facility-Administered Medications on File Prior to Visit  Medication Dose Route Frequency Provider Last Rate Last Dose  . 0.9 %  sodium chloride infusion  500 mL Intravenous Continuous  Ladene Artist, MD        BP 140/80 mmHg  Pulse 79  Temp(Src) 98.4 F (36.9 C) (Oral)  Resp 20  Ht 5\' 7"  (1.702 m)  Wt 187 lb (84.823 kg)  BMI 29.28 kg/m2  SpO2 97%     Review of Systems  Constitutional: Positive for fatigue. Negative for fever, chills and appetite change.  HENT: Negative for congestion, dental problem, ear pain, hearing loss, sore throat, tinnitus, trouble swallowing and voice change.   Eyes: Negative for pain, discharge and visual disturbance.  Respiratory: Positive for cough. Negative for chest tightness, wheezing and stridor.   Cardiovascular: Negative for chest pain, palpitations and leg swelling.  Gastrointestinal: Negative for nausea, vomiting, abdominal pain, diarrhea, constipation, blood in stool and abdominal distention.  Genitourinary: Negative for urgency, hematuria, flank pain, discharge, difficulty urinating and genital sores.    Musculoskeletal: Negative for myalgias, back pain, joint swelling, arthralgias, gait problem and neck stiffness.  Skin: Negative for rash.  Neurological: Negative for dizziness, syncope, speech difficulty, weakness, numbness and headaches.  Hematological: Negative for adenopathy. Does not bruise/bleed easily.  Psychiatric/Behavioral: Negative for behavioral problems and dysphoric mood. The patient is not nervous/anxious.        Objective:   Physical Exam  Constitutional: He is oriented to person, place, and time. He appears well-developed.  Appears well and in no acute distress.  Afebrile.  O2 saturation 97    HENT:  Head: Normocephalic.  Right Ear: External ear normal.  Left Ear: External ear normal.  Eyes: Conjunctivae and EOM are normal.  Neck: Normal range of motion.  Cardiovascular: Normal rate and normal heart sounds.   Pulmonary/Chest: Effort normal and breath sounds normal. He has no wheezes.  Chest is essentially clear  Abdominal: Bowel sounds are normal.  Musculoskeletal: Normal range of motion. He exhibits no edema or tenderness.  Neurological: He is alert and oriented to person, place, and time.  Psychiatric: He has a normal mood and affect. His behavior is normal.          Assessment & Plan:   Improving.  Viral URI with cough.  Will continue symptomatic treatment Essential hypertension, stable  Patient will report any new or worsening symptoms

## 2016-03-25 NOTE — Patient Instructions (Signed)
Acute bronchitis symptoms  are generally not helped by antibiotics.  Take over-the-counter expectorants and cough medications such as  Mucinex DM.  Call if there is no improvement in 5 to 7 days or if  you develop worsening cough, fever, or new symptoms, such as shortness of breath or chest pain.  HOME CARE INSTRUCTIONS  Drink plenty of water. Water helps thin the mucus so your sinuses can drain more easily.  Use a humidifier.  Inhale steam 3-4 times a day (for example, sit in the bathroom with the shower running).  Apply a warm, moist washcloth to your face 3-4 times a day, or as directed by your health care provider.  Use saline nasal sprays to help moisten and clean your sinuses.

## 2016-03-27 ENCOUNTER — Other Ambulatory Visit: Payer: Self-pay | Admitting: Internal Medicine

## 2016-04-17 ENCOUNTER — Ambulatory Visit (AMBULATORY_SURGERY_CENTER): Payer: Self-pay

## 2016-04-17 VITALS — Ht 67.0 in | Wt 183.0 lb

## 2016-04-17 DIAGNOSIS — Z860100 Personal history of colon polyps, unspecified: Secondary | ICD-10-CM

## 2016-04-17 MED ORDER — SUPREP BOWEL PREP KIT 17.5-3.13-1.6 GM/177ML PO SOLN
1.0000 | Freq: Once | ORAL | Status: DC
Start: 2016-04-17 — End: 2016-05-01

## 2016-04-17 NOTE — Progress Notes (Signed)
No allergies to eggs or soy No past problems with anesthesia No home oxygen No diet meds  Has email and internet; declined emmi 

## 2016-04-18 ENCOUNTER — Encounter: Payer: Self-pay | Admitting: Gastroenterology

## 2016-05-01 ENCOUNTER — Ambulatory Visit (AMBULATORY_SURGERY_CENTER): Payer: Medicare Other | Admitting: Gastroenterology

## 2016-05-01 ENCOUNTER — Encounter: Payer: Self-pay | Admitting: Gastroenterology

## 2016-05-01 VITALS — BP 128/74 | HR 95 | Temp 98.6°F | Resp 21 | Ht 67.0 in | Wt 187.0 lb

## 2016-05-01 DIAGNOSIS — D125 Benign neoplasm of sigmoid colon: Secondary | ICD-10-CM | POA: Diagnosis not present

## 2016-05-01 DIAGNOSIS — Z86010 Personal history of colonic polyps: Secondary | ICD-10-CM | POA: Diagnosis present

## 2016-05-01 MED ORDER — SODIUM CHLORIDE 0.9 % IV SOLN: 500.0000 mL | INTRAVENOUS | Status: DC

## 2016-05-01 NOTE — Op Note (Addendum)
[place] Patient Name: Paul Schwartz Procedure Date: 05/01/2016 11:30 AM MRN: [id number] Endoscopist: Ladene Artist , MD Age: 75 Referring MD:  Date of Birth: 03-Dec-1940 Gender: Male Account #: [id number] Procedure:                Colonoscopy Indications:              Surveillance: Personal history of adenomatous                            polyps on last colonoscopy > 5 years ago Medicines:                Monitored Anesthesia Care Procedure:                Pre-Anesthesia Assessment:                           - Prior to the procedure, a History and Physical                            was performed, and patient medications and                            allergies were reviewed. The patient's tolerance of                            previous anesthesia was also reviewed. The risks                            and benefits of the procedure and the sedation                            options and risks were discussed with the patient.                            All questions were answered, and informed consent                            was obtained. Prior Anticoagulants: The patient has                            taken no previous anticoagulant or antiplatelet                            agents. ASA Grade Assessment: II - A patient with                            mild systemic disease. After reviewing the risks                            and benefits, the patient was deemed in                            satisfactory condition to undergo the procedure.  After obtaining informed consent, the colonoscope                            was passed under direct vision. Throughout the                            procedure, the patient's blood pressure, pulse, and                            oxygen saturations were monitored continuously. The                            Model PCF-H190L [id number]) scope was introduced                            through the anus and  advanced to the the cecum,                            identified by appendiceal orifice and ileocecal                            valve. The ileocecal valve, appendiceal orifice,                            and rectum were photographed. The quality of the                            bowel preparation was excellent. The colonoscopy                            was performed without difficulty. The patient                            tolerated the procedure well. Scope In: 11:41:53 AM Scope Out: 11:54:53 AM Scope Withdrawal Time: 0 hours 10 minutes 12 seconds  Total Procedure Duration: 0 hours 13 minutes 0 seconds  Findings:                 Two sessile polyps were found in the sigmoid colon.                            The polyps were 5 to 7 mm in size. These polyps                            were removed with a cold snare. Resection and                            retrieval were complete.                           The exam was otherwise without abnormality on                            direct and retroflexion views. Complications:  No immediate complications. Estimated blood loss:                            None. Estimated Blood Loss:     Estimated blood loss: none. Impression:               - Two 5 to 7 mm polyps in the sigmoid colon,                            removed with a cold snare. Resected and retrieved.                           - The examination was otherwise normal on direct                            and retroflexion views. Recommendation:           - Repeat colonoscopy in 5 years for surveillance.                           - Patient has a contact number available for                            emergencies. The signs and symptoms of potential                            delayed complications were discussed with the                            patient. Return to normal activities tomorrow.                            Written discharge instructions were provided to the                             patient.                           - Resume previous diet.                           - Continue present medications.                           - Await pathology results. Ladene Artist, MD 05/01/2016 11:57:38 AM This report has been signed electronically.

## 2016-05-01 NOTE — Patient Instructions (Signed)
YOU HAD AN ENDOSCOPIC PROCEDURE TODAY AT THE [facility]:   Refer to the procedure report that was given to you for any specific questions about what was found during the examination.  If the procedure report does not answer your questions, please call your gastroenterologist to clarify.  If you requested that your care partner not be given the details of your procedure findings, then the procedure report has been included in a sealed envelope for you to review at your convenience later.  YOU SHOULD EXPECT: Some feelings of bloating in the abdomen. Passage of more gas than usual.  Walking can help get rid of the air that was put into your GI tract during the procedure and reduce the bloating. If you had a lower endoscopy (such as a colonoscopy or flexible sigmoidoscopy) you may notice spotting of blood in your stool or on the toilet paper. If you underwent a bowel prep for your procedure, you may not have a normal bowel movement for a few days.  Please Note:  You might notice some irritation and congestion in your nose or some drainage.  This is from the oxygen used during your procedure.  There is no need for concern and it should clear up in a day or so.  SYMPTOMS TO REPORT IMMEDIATELY:   Following lower endoscopy (colonoscopy or flexible sigmoidoscopy):  Excessive amounts of blood in the stool  Significant tenderness or worsening of abdominal pains  Swelling of the abdomen that is new, acute  Fever of 100F or higher   For urgent or emergent issues, a gastroenterologist can be reached at any hour by calling [phone].   DIET: Your first meal following the procedure should be a small meal and then it is ok to progress to your normal diet. Heavy or fried foods are harder to digest and may make you feel nauseous or bloated.  Likewise, meals heavy in dairy and vegetables can increase bloating.  Drink plenty of fluids but you should avoid alcoholic beverages for 24  hours.  ACTIVITY:  You should plan to take it easy for the rest of today and you should NOT DRIVE or use heavy machinery until tomorrow (because of the sedation medicines used during the test).    FOLLOW UP: Our staff will call the number listed on your records the next business day following your procedure to check on you and address any questions or concerns that you may have regarding the information given to you following your procedure. If we do not reach you, we will leave a message.  However, if you are feeling well and you are not experiencing any problems, there is no need to return our call.  We will assume that you have returned to your regular daily activities without incident.  If any biopsies were taken you will be contacted by phone or by letter within the next 1-3 weeks.  Please call us at [phone] if you have not heard about the biopsies in 3 weeks.    SIGNATURES/CONFIDENTIALITY: You and/or your care partner have signed paperwork which will be entered into your electronic medical record.  These signatures attest to the fact that that the information above on your After Visit Summary has been reviewed and is understood.  Full responsibility of the confidentiality of this discharge information lies with you and/or your care-partner.    Resume medications. Information given on polyps. 

## 2016-05-01 NOTE — Progress Notes (Signed)
No problems noted in the recovery room. maw 

## 2016-05-01 NOTE — Progress Notes (Signed)
Called to room to assist during endoscopic procedure.  Patient ID and intended procedure confirmed with present staff. Received instructions for my participation in the procedure from the performing physician.  

## 2016-05-01 NOTE — Progress Notes (Signed)
Report to PACU, RN, vss, BBS= Clear.  

## 2016-05-02 ENCOUNTER — Telehealth: Payer: Self-pay

## 2016-05-02 NOTE — Telephone Encounter (Signed)
  Follow up Call-  Call back number 05/01/2016  Post procedure Call Back phone  # [phone]  Permission to leave phone message Yes     Patient questions:  Do you have a fever, pain , or abdominal swelling? No. Pain Score  0 *  Have you tolerated food without any problems? Yes.    Have you been able to return to your normal activities? Yes.    Do you have any questions about your discharge instructions: Diet   No. Medications  No. Follow up visit  No.  Do you have questions or concerns about your Care? No.  Actions: * If pain score is 4 or above: No action needed, pain <4.

## 2016-05-06 ENCOUNTER — Encounter: Payer: Self-pay | Admitting: Sports Medicine

## 2016-05-06 ENCOUNTER — Ambulatory Visit (INDEPENDENT_AMBULATORY_CARE_PROVIDER_SITE_OTHER): Payer: Medicare Other | Admitting: Sports Medicine

## 2016-05-06 ENCOUNTER — Encounter: Payer: Self-pay | Admitting: Gastroenterology

## 2016-05-06 DIAGNOSIS — M79671 Pain in right foot: Secondary | ICD-10-CM

## 2016-05-06 DIAGNOSIS — M79672 Pain in left foot: Secondary | ICD-10-CM

## 2016-05-06 DIAGNOSIS — L84 Corns and callosities: Secondary | ICD-10-CM | POA: Diagnosis not present

## 2016-05-06 NOTE — Progress Notes (Signed)
Patient ID: Paul Schwartz, male   DOB: 10/31/1941, 75 y.o.   MRN: [id number] Subjective: [name] is a 75 y.o. male patient who presents to office for evaluation of bilateral foot pain secondary to callus skin. Patient complains of pain after doing yard work at callus areas; reports that he has been dealing with callus for years. Patient has tried skin creams with no relief in symptoms. Reports that they feel best after being trimmed; use to see Dr. Mamie Nick. Patient denies any other pedal complaints.   Patient Active Problem List   Diagnosis Date Noted  . Hx of colonic polyps 01/15/2012  . HYPOGONADISM 10/29/2010  . NEPHROLITHIASIS, HX OF 05/15/2010  . ERECTILE DYSFUNCTION, NON-ORGANIC, MILD 10/30/2009  . TOBACCO USE, QUIT 10/30/2009  . 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 Essential hypertension 08/17/2008  . NOCTURIA 08/17/2008  . GERD 03/01/2008  . Arthropathy 01/19/2008    Current Outpatient Prescriptions on File Prior to Visit  Medication Sig Dispense Refill  . aspirin 81 MG tablet Take 81 mg by mouth 2 (two) times daily.      . cyclobenzaprine (FLEXERIL) 5 MG tablet Take 1 tablet (5 mg total) by mouth 3 (three) times daily as needed for muscle spasms. 30 tablet 1  . diclofenac (VOLTAREN) 75 MG EC tablet TAKE 1 TABLET TWICE A DAY AFTER MEALS 180 tablet 3  . LORazepam (ATIVAN) 1 MG tablet TAKE 1 TABLET 3 TIMES A DAY AS NEEDED FOR ANXIETY 60 tablet 2  . Multiple Vitamin (MULTIVITAMIN) tablet Take 1 tablet by mouth daily.      . NON FORMULARY Zipcor 25 mg; q qid for arthritic pain     . Omega-3 Fatty Acids (OMEGA 3 PO) Take by mouth.    . tadalafil (CIALIS) 10 MG tablet Take 1 tablet (10 mg total) by mouth daily as needed for erectile dysfunction. 10 tablet 6  . tamsulosin (FLOMAX) 0.4 MG CAPS capsule Take 1 capsule (0.4 mg total) by mouth daily. To prevent night time urination 90 capsule 5   Current Facility-Administered Medications on File Prior to Visit  Medication Dose Route  Frequency Provider Last Rate Last Dose  . 0.9 %  sodium chloride infusion  500 mL Intravenous Continuous Ladene Artist, MD        Allergies  Allergen Reactions  . Plasticized Base [Plastibase] Hives and Rash    Objective:  General: Alert and oriented x3 in no acute distress  Dermatology: Diffuse Keratotic lesion present sub met 2-4  Bilateral with skin lines transversing the lesion, pain is present with direct pressure to the lesion with a mild central nucleated core noted R>L, no webspace macerations, no ecchymosis bilateral, all nails x 10 are well manicured.  Vascular: Dorsalis Pedis and Posterior Tibial pedal pulses 2/4, Capillary Fill Time 3 seconds, + pedal hair growth bilateral, no edema bilateral lower extremities, Temperature gradient within normal limits.  Neurology: Johney Maine sensation intact via light touch bilateral.  Musculoskeletal: Mild tenderness with palpation at the keratotic lesion site on Right>Left, Muscular strength 5/5 in all groups without pain or limitation on range of motion. No lower extremity muscular or boney deformity noted.  Assessment and Plan: Problem List Items Addressed This Visit    None    Visit Diagnoses    Callus of foot    -  Primary    Foot pain, bilateral          -Complete examination performed -Discussed treatment options -Parred keratoic lesion using a chisel blade; treated  the area withSalinocaine covered with moleskin, remove in 1 day -Encouraged daily skin emollients -Encouraged use of pumice stone -Advised good supportive shoes and OTC inserts -Encouraged patient to get rid of worn out shoes especially the shoes that he uses for yard work -Patient to return to office as needed or sooner if condition worsens.  Landis Martins, DPM

## 2016-05-12 ENCOUNTER — Telehealth: Payer: Self-pay | Admitting: Gastroenterology

## 2016-05-12 NOTE — Telephone Encounter (Signed)
Spoke with patient's wife and reviewed results letter with her and answered her questions.

## 2016-05-19 DIAGNOSIS — H25012 Cortical age-related cataract, left eye: Secondary | ICD-10-CM | POA: Diagnosis not present

## 2016-05-19 DIAGNOSIS — H40033 Anatomical narrow angle, bilateral: Secondary | ICD-10-CM | POA: Diagnosis not present

## 2016-05-19 DIAGNOSIS — Z961 Presence of intraocular lens: Secondary | ICD-10-CM | POA: Diagnosis not present

## 2016-05-19 DIAGNOSIS — H353112 Nonexudative age-related macular degeneration, right eye, intermediate dry stage: Secondary | ICD-10-CM | POA: Diagnosis not present

## 2016-05-19 DIAGNOSIS — H40023 Open angle with borderline findings, high risk, bilateral: Secondary | ICD-10-CM | POA: Diagnosis not present

## 2016-07-18 DIAGNOSIS — M1712 Unilateral primary osteoarthritis, left knee: Secondary | ICD-10-CM | POA: Diagnosis not present

## 2016-07-18 DIAGNOSIS — M1711 Unilateral primary osteoarthritis, right knee: Secondary | ICD-10-CM | POA: Diagnosis not present

## 2016-07-18 DIAGNOSIS — M170 Bilateral primary osteoarthritis of knee: Secondary | ICD-10-CM | POA: Diagnosis not present

## 2016-07-24 ENCOUNTER — Telehealth: Payer: Self-pay | Admitting: Gastroenterology

## 2016-07-24 NOTE — Telephone Encounter (Signed)
I explained to the patient and his wife that the polyp has been removed and no longer has a potential to be a colon cancer.  All questions answered about pathology . They will call back for any additional questions or concerns

## 2016-07-30 ENCOUNTER — Other Ambulatory Visit: Payer: Self-pay | Admitting: Internal Medicine

## 2016-08-13 DIAGNOSIS — M25511 Pain in right shoulder: Secondary | ICD-10-CM | POA: Diagnosis not present

## 2016-08-13 DIAGNOSIS — M7541 Impingement syndrome of right shoulder: Secondary | ICD-10-CM | POA: Diagnosis not present

## 2016-08-13 DIAGNOSIS — M19012 Primary osteoarthritis, left shoulder: Secondary | ICD-10-CM | POA: Diagnosis not present

## 2016-08-15 ENCOUNTER — Ambulatory Visit (INDEPENDENT_AMBULATORY_CARE_PROVIDER_SITE_OTHER): Payer: Medicare Other

## 2016-08-15 DIAGNOSIS — Z23 Encounter for immunization: Secondary | ICD-10-CM | POA: Diagnosis not present

## 2016-10-31 ENCOUNTER — Other Ambulatory Visit (INDEPENDENT_AMBULATORY_CARE_PROVIDER_SITE_OTHER): Payer: Medicare Other

## 2016-10-31 DIAGNOSIS — Z0000 Encounter for general adult medical examination without abnormal findings: Secondary | ICD-10-CM | POA: Diagnosis not present

## 2016-10-31 LAB — BASIC METABOLIC PANEL
BUN: 18 mg/dL (ref 6–23)
CHLORIDE: 105 meq/L (ref 96–112)
CO2: 32 meq/L (ref 19–32)
Calcium: 9.5 mg/dL (ref 8.4–10.5)
Creatinine, Ser: 0.92 mg/dL (ref 0.40–1.50)
GFR: 84.9 mL/min (ref >60.00)
GLUCOSE: 98 mg/dL (ref 70–99)
POTASSIUM: 3.9 meq/L (ref 3.5–5.1)
Sodium: 142 mEq/L (ref 135–145)

## 2016-10-31 LAB — LIPID PANEL
CHOLESTEROL: 201 mg/dL — AB (ref 0–200)
HDL: 62 mg/dL (ref >39.00)
LDL CALC: 120 mg/dL — AB (ref 0–99)
NonHDL: 138.92
Total CHOL/HDL Ratio: 3
Triglycerides: 96 mg/dL (ref 0.0–149.0)
VLDL: 19.2 mg/dL (ref 0.0–40.0)

## 2016-10-31 LAB — POC URINALSYSI DIPSTICK (AUTOMATED)
BILIRUBIN UA: NEGATIVE
GLUCOSE UA: NEGATIVE
Ketones, UA: NEGATIVE
Leukocytes, UA: NEGATIVE
NITRITE UA: NEGATIVE
PH UA: 5.5
Protein, UA: NEGATIVE
RBC UA: NEGATIVE
Spec Grav, UA: 1.02
UROBILINOGEN UA: 0.2

## 2016-10-31 LAB — CBC WITH DIFFERENTIAL/PLATELET
BASOS PCT: 0.5 % (ref 0.0–3.0)
Basophils Absolute: 0 10*3/uL (ref 0.0–0.1)
EOS PCT: 2.2 % (ref 0.0–5.0)
Eosinophils Absolute: 0.1 10*3/uL (ref 0.0–0.7)
HCT: 44.8 % (ref 39.0–52.0)
Hemoglobin: 15.4 g/dL (ref 13.0–17.0)
LYMPHS ABS: 1.2 10*3/uL (ref 0.7–4.0)
Lymphocytes Relative: 20.6 % (ref 12.0–46.0)
MCHC: 34.4 g/dL (ref 30.0–36.0)
MCV: 95.5 fl (ref 78.0–100.0)
MONO ABS: 0.4 10*3/uL (ref 0.1–1.0)
Monocytes Relative: 5.9 % (ref 3.0–12.0)
NEUTROS ABS: 4.3 10*3/uL (ref 1.4–7.7)
NEUTROS PCT: 70.8 % (ref 43.0–77.0)
Platelets: 215 10*3/uL (ref 150.0–400.0)
RBC: 4.68 Mil/uL (ref 4.22–5.81)
RDW: 13.9 % (ref 11.5–15.5)
WBC: 6.1 10*3/uL (ref 4.0–10.5)

## 2016-10-31 LAB — TSH: TSH: 1.9 u[IU]/mL (ref 0.35–4.50)

## 2016-10-31 LAB — HEPATIC FUNCTION PANEL
ALBUMIN: 4.3 g/dL (ref 3.5–5.2)
ALT: 19 U/L (ref 0–53)
AST: 22 U/L (ref 0–37)
Alkaline Phosphatase: 45 U/L (ref 39–117)
BILIRUBIN TOTAL: 0.8 mg/dL (ref 0.2–1.2)
Bilirubin, Direct: 0.1 mg/dL (ref 0.0–0.3)
Total Protein: 6.5 g/dL (ref 6.0–8.3)

## 2016-10-31 LAB — PSA: PSA: 4.87 ng/mL — AB (ref 0.10–4.00)

## 2016-11-24 ENCOUNTER — Ambulatory Visit (INDEPENDENT_AMBULATORY_CARE_PROVIDER_SITE_OTHER): Payer: Medicare Other | Admitting: Internal Medicine

## 2016-11-24 ENCOUNTER — Encounter: Payer: Self-pay | Admitting: Internal Medicine

## 2016-11-24 VITALS — BP 148/80 | HR 76 | Temp 97.8°F | Ht 67.0 in | Wt 187.0 lb

## 2016-11-24 DIAGNOSIS — Z0000 Encounter for general adult medical examination without abnormal findings: Secondary | ICD-10-CM | POA: Diagnosis not present

## 2016-11-24 NOTE — Progress Notes (Signed)
Pre visit review using our clinic review tool, if applicable. No additional management support is needed unless otherwise documented below in the visit note. 

## 2016-11-24 NOTE — Progress Notes (Signed)
Subjective:    Patient ID: Paul Schwartz, male    DOB: [birth year], 76 y.o.   MRN: [id number]  HPI 76 year old patient who is seen today for a preventive health examination.  He has a history colonic polyps and did have follow colonoscopy performed in June 2017. He is doing quite well today.  He does have a history of BPH and mildly elevated PSA.  This has been stable.  He no longer takes Flomax.  He only has nocturia times 1. He does have a history of mild anxiety disorder and does take lorazepam 2 or 3 times per week  Past Medical History:  Diagnosis Date  . Arthritis   . Bilateral shoulder pain   . BPH (benign prostatic hyperplasia)   . GERD (gastroesophageal reflux disease)   . Renal calculus    x2  . Vertigo    dizziness     Social History   Social History  . Marital status: Married    Spouse name: N/A  . Number of children: N/A  . Years of education: N/A   Occupational History  . Not on file.   Social History Main Topics  . Smoking status: Former Smoker    Types: Cigarettes    Quit date: 04/07/1968  . Smokeless tobacco: Never Used  . Alcohol use 2.4 oz/week    4 Glasses of wine per week     Comment: weekends  . Drug use: No  . Sexual activity: Yes   Other Topics Concern  . Not on file   Social History Narrative  . No narrative on file    Past Surgical History:  Procedure Laterality Date  . achilles tendon     surgical repair bilat  . COLONOSCOPY      Family History  Problem Relation Age of Onset  . Heart disease Father   . Colon cancer Neg Hx   . Esophageal cancer Neg Hx   . Stomach cancer Neg Hx   . Rectal cancer Neg Hx     Allergies  Allergen Reactions  . Plasticized Base [Plastibase] Hives and Rash    Current Outpatient Prescriptions on File Prior to Visit  Medication Sig Dispense Refill  . aspirin 81 MG tablet Take 81 mg by mouth 2 (two) times daily.      . cyclobenzaprine (FLEXERIL) 5 MG tablet Take 1 tablet (5 mg total) by  mouth 3 (three) times daily as needed for muscle spasms. 30 tablet 1  . diclofenac (VOLTAREN) 75 MG EC tablet TAKE 1 TABLET TWICE A DAY AFTER MEALS 180 tablet 3  . LORazepam (ATIVAN) 1 MG tablet TAKE 1 TABLET 3 TIMES A DAY AS NEEDED FOR ANXIETY 60 tablet 2  . Multiple Vitamin (MULTIVITAMIN) tablet Take 1 tablet by mouth daily.      . NON FORMULARY Zipcor 25 mg; q qid for arthritic pain     . Omega-3 Fatty Acids (OMEGA 3 PO) Take by mouth.    . tadalafil (CIALIS) 10 MG tablet Take 1 tablet (10 mg total) by mouth daily as needed for erectile dysfunction. 10 tablet 6   Current Facility-Administered Medications on File Prior to Visit  Medication Dose Route Frequency Provider Last Rate Last Dose  . 0.9 %  sodium chloride infusion  500 mL Intravenous Continuous Ladene Artist, MD        BP (!) 148/80 (BP Location: Right Arm, Patient Position: Sitting, Cuff Size: Normal)   Pulse 76   Temp 97.8 F (36.6 C) (  Oral)   Ht 5\' 7"  (1.702 m)   Wt 187 lb (84.8 kg)   SpO2 97%   BMI 29.29 kg/m   Medicare wellness  1. Risk factors, based on past  M,S,F history.  No significant cardiovascular risk factors other than age and sex  2.  Physical activities:remains quite active.  Still participates at his health club  3.  Depression/mood:no history of major depression or mood disorder  4.  Hearing:no deficits  5.  ADL's:independent  6.  Fall risk:low  7.  Home safety:no problems identified  8.  Height weight, and visual acuity;height and weight stable, although there has been a modest weight gain over the holidays.  He is planning on cataract extraction surgery on the left eye  9.  Counseling:continue heart healthy diet and active exercise program  10. Lab orders based on risk factors:laboratory studies from last month reviewed  11. Referral :not appropriate at this time  12. Care plan:modest weight loss encouraged.  Continue efforts at aggressive risk factor modification  13. Cognitive  assessment: alert and oriented with normal affect.  No co 14. Screening: Patient provided with a written and personalized 5-10 year screening schedule in the AVS.    15. Provider List Update: primary care ophthalmology GI     Review of Systems  Constitutional: Negative for appetite change, chills, fatigue and fever.  HENT: Negative for congestion, dental problem, ear pain, hearing loss, sore throat, tinnitus, trouble swallowing and voice change.   Eyes: Negative for pain, discharge and visual disturbance.  Respiratory: Negative for cough, chest tightness, wheezing and stridor.   Cardiovascular: Negative for chest pain, palpitations and leg swelling.  Gastrointestinal: Negative for abdominal distention, abdominal pain, blood in stool, constipation, diarrhea, nausea and vomiting.  Genitourinary: Negative for difficulty urinating, discharge, flank pain, genital sores, hematuria and urgency.  Musculoskeletal: Negative for arthralgias, back pain, gait problem, joint swelling, myalgias and neck stiffness.  Skin: Negative for rash.  Neurological: Negative for dizziness, syncope, speech difficulty, weakness, numbness and headaches.  Hematological: Negative for adenopathy. Does not bruise/bleed easily.  Psychiatric/Behavioral: Negative for behavioral problems and dysphoric mood. The patient is not nervous/anxious.        Objective:   Physical Exam  Constitutional: He appears well-developed and well-nourished.  Blood pressure 138/80  HENT:  Head: Normocephalic and atraumatic.  Right Ear: External ear normal.  Left Ear: External ear normal.  Nose: Nose normal.  Mouth/Throat: Oropharynx is clear and moist.  Eyes: Conjunctivae and EOM are normal. Pupils are equal, round, and reactive to light. No scleral icterus.  Neck: Normal range of motion. Neck supple. No JVD present. No thyromegaly present.  Cardiovascular: Regular rhythm and intact distal pulses.  Exam reveals no gallop and no friction  rub.   Murmur heard. Grade 2/6 systolic murmur  Pulmonary/Chest: Effort normal and breath sounds normal. He exhibits no tenderness.  Abdominal: Soft. Bowel sounds are normal. He exhibits no distension and no mass. There is no tenderness.  Genitourinary: Penis normal.  Musculoskeletal: Normal range of motion. He exhibits no edema or tenderness.  Lymphadenopathy:    He has no cervical adenopathy.  Neurological: He is alert. He has normal reflexes. No cranial nerve deficit. Coordination normal.  Skin: Skin is warm and dry. No rash noted.  Psychiatric: He has a normal mood and affect. His behavior is normal.          Assessment & Plan:   Preventive health examination Medicare wellness visit BPH with history of mild elevated  PSA History of essential hypertension.  Remains stable off medication.  Repeat blood pressure 140/80  [name]

## 2016-11-24 NOTE — Patient Instructions (Addendum)
Limit your sodium (Salt) intake    It is important that you exercise regularly, at least 20 minutes 3 to 4 times per week.  If you develop chest pain or shortness of breath seek  medical attention.  Please check your blood pressure on a regular basis.  If it is consistently greater than 150/90, please make an office appointment.  Return in one year for follow-up On discussion Spondylosis and time reviewing his chart and all

## 2017-05-18 DIAGNOSIS — D2312 Other benign neoplasm of skin of left eyelid, including canthus: Secondary | ICD-10-CM | POA: Diagnosis not present

## 2017-05-18 DIAGNOSIS — H40023 Open angle with borderline findings, high risk, bilateral: Secondary | ICD-10-CM | POA: Diagnosis not present

## 2017-05-18 DIAGNOSIS — H40032 Anatomical narrow angle, left eye: Secondary | ICD-10-CM | POA: Diagnosis not present

## 2017-05-18 DIAGNOSIS — H04123 Dry eye syndrome of bilateral lacrimal glands: Secondary | ICD-10-CM | POA: Diagnosis not present

## 2017-05-19 DIAGNOSIS — M1712 Unilateral primary osteoarthritis, left knee: Secondary | ICD-10-CM | POA: Diagnosis not present

## 2017-05-19 DIAGNOSIS — M1711 Unilateral primary osteoarthritis, right knee: Secondary | ICD-10-CM | POA: Diagnosis not present

## 2017-06-23 ENCOUNTER — Ambulatory Visit (INDEPENDENT_AMBULATORY_CARE_PROVIDER_SITE_OTHER): Payer: Medicare Other | Admitting: Internal Medicine

## 2017-06-23 ENCOUNTER — Encounter: Payer: Self-pay | Admitting: Internal Medicine

## 2017-06-23 VITALS — BP 142/78 | HR 72 | Temp 97.9°F | Ht 67.0 in | Wt 179.6 lb

## 2017-06-23 DIAGNOSIS — R319 Hematuria, unspecified: Secondary | ICD-10-CM | POA: Diagnosis not present

## 2017-06-23 DIAGNOSIS — Z87442 Personal history of urinary calculi: Secondary | ICD-10-CM

## 2017-06-23 DIAGNOSIS — I10 Essential (primary) hypertension: Secondary | ICD-10-CM

## 2017-06-23 LAB — URINALYSIS, ROUTINE W REFLEX MICROSCOPIC
Bilirubin Urine: NEGATIVE
Ketones, ur: NEGATIVE
Leukocytes, UA: NEGATIVE
Nitrite: NEGATIVE
SPECIFIC GRAVITY, URINE: 1.015 (ref 1.000–1.030)
URINE GLUCOSE: NEGATIVE
Urobilinogen, UA: 0.2 (ref 0.0–1.0)
pH: 7 (ref 5.0–8.0)

## 2017-06-23 LAB — POC URINALSYSI DIPSTICK (AUTOMATED)
Protein, UA: 15
SPEC GRAV UA: 1.02 (ref 1.010–1.025)
Urobilinogen, UA: 0.2 E.U./dL
pH, UA: 6 (ref 5.0–8.0)

## 2017-06-23 MED ORDER — DICLOFENAC SODIUM 75 MG PO TBEC: 75.0000 mg | DELAYED_RELEASE_TABLET | Freq: Two times a day (BID) | ORAL | 3 refills | Status: DC

## 2017-06-23 MED ORDER — CIPROFLOXACIN HCL 500 MG PO TABS: 500.0000 mg | ORAL_TABLET | Freq: Two times a day (BID) | ORAL | 0 refills | Status: DC

## 2017-06-23 NOTE — Progress Notes (Signed)
Subjective:    Patient ID: Paul Schwartz, male    DOB: [birth year], 76 y.o.   MRN: [id number]  HPI 76 year old patient who presents with a chief complaint of lumbar pain.  This has been present for 4-5 days.  Pain is worse in the morning and is aggravated by standing from a sitting position.  Pain seems to be quite minor.  He has no nocturnal pain and does well throughout the day.  Remains quite active and he states that he walks 5 miles daily He does have a history of nephrolithiasis and BPH.  He states last week that he noted a small amount of blood in the urine.  He took a single tablet of Levaquin that he apparently had left over from a prior prostate infection.  He has seen no further hematuria.  No voiding symptoms  Past Medical History:  Diagnosis Date  . Arthritis   . Bilateral shoulder pain   . BPH (benign prostatic hyperplasia)   . GERD (gastroesophageal reflux disease)   . Renal calculus    x2  . Vertigo    dizziness     Social History   Social History  . Marital status: Married    Spouse name: N/A  . Number of children: N/A  . Years of education: N/A   Occupational History  . Not on file.   Social History Main Topics  . Smoking status: Former Smoker    Types: Cigarettes    Quit date: 04/07/1968  . Smokeless tobacco: Never Used  . Alcohol use 2.4 oz/week    4 Glasses of wine per week     Comment: weekends  . Drug use: No  . Sexual activity: Yes   Other Topics Concern  . Not on file   Social History Narrative  . No narrative on file    Past Surgical History:  Procedure Laterality Date  . achilles tendon     surgical repair bilat  . COLONOSCOPY      Family History  Problem Relation Age of Onset  . Heart disease Father   . Colon cancer Neg Hx   . Esophageal cancer Neg Hx   . Stomach cancer Neg Hx   . Rectal cancer Neg Hx     Allergies  Allergen Reactions  . Plasticized Base [Plastibase] Hives and Rash    Current Outpatient  Prescriptions on File Prior to Visit  Medication Sig Dispense Refill  . aspirin 81 MG tablet Take 81 mg by mouth 2 (two) times daily.      . diclofenac (VOLTAREN) 75 MG EC tablet TAKE 1 TABLET TWICE A DAY AFTER MEALS 180 tablet 3  . LORazepam (ATIVAN) 1 MG tablet TAKE 1 TABLET 3 TIMES A DAY AS NEEDED FOR ANXIETY 60 tablet 2  . Multiple Vitamin (MULTIVITAMIN) tablet Take 1 tablet by mouth daily.      . NON FORMULARY Zipcor 25 mg; q qid for arthritic pain     . Omega-3 Fatty Acids (OMEGA 3 PO) Take by mouth.    . tadalafil (CIALIS) 10 MG tablet Take 1 tablet (10 mg total) by mouth daily as needed for erectile dysfunction. 10 tablet 6   Current Facility-Administered Medications on File Prior to Visit  Medication Dose Route Frequency Provider Last Rate Last Dose  . 0.9 %  sodium chloride infusion  500 mL Intravenous Continuous Ladene Artist, MD        BP (!) 142/78 (BP Location: Left Arm, Patient Position: Sitting, Cuff  Size: Normal)   Pulse 72   Temp 97.9 F (36.6 C) (Oral)   Ht 5\' 7"  (1.702 m)   Wt 179 lb 9.6 oz (81.5 kg)   SpO2 98%   BMI 28.13 kg/m      Review of Systems  Constitutional: Negative for appetite change, chills, fatigue and fever.  HENT: Negative for congestion, dental problem, ear pain, hearing loss, sore throat, tinnitus, trouble swallowing and voice change.   Eyes: Negative for pain, discharge and visual disturbance.  Respiratory: Negative for cough, chest tightness, wheezing and stridor.   Cardiovascular: Negative for chest pain, palpitations and leg swelling.  Gastrointestinal: Negative for abdominal distention, abdominal pain, blood in stool, constipation, diarrhea, nausea and vomiting.  Genitourinary: Positive for hematuria. Negative for difficulty urinating, discharge, flank pain, genital sores and urgency.  Musculoskeletal: Positive for back pain. Negative for arthralgias, gait problem, joint swelling, myalgias and neck stiffness.  Skin: Negative for rash.   Neurological: Negative for dizziness, syncope, speech difficulty, weakness, numbness and headaches.  Hematological: Negative for adenopathy. Does not bruise/bleed easily.  Psychiatric/Behavioral: Negative for behavioral problems and dysphoric mood. The patient is not nervous/anxious.        Objective:   Physical Exam  Constitutional: He appears well-developed and well-nourished. No distress.  Blood pressure well controlled  Musculoskeletal:  No lumbar pain to palpation Negative straight leg test Full range of motion both hips Neurovascular structures intact          Assessment & Plan:   Lumbar strain.  Discomfort seems very minor.  Will moderate activity for the short term.  Consider anti-inflammatory medications if needed History of gross hematuria last week.  We'll review a UA. This revealed trace hematuria.  Will treat with Cipro for 3 days and repeat UA in 2 weeks.  If remains positive, will set up for urological evaluation History of nephrolithiasis  Paul Schwartz

## 2017-06-23 NOTE — Patient Instructions (Addendum)
You  may move around, but avoid painful motions and activities.  Apply heat to the sore area for 15 to 20 minutes 3 or 4 times daily for the next two to 3 days.  Moderate your activities until low back discomfort has improved  Okay to use diclofenac twice daily as needed for lumbar pain  Call or return to clinic prn if these symptoms worsen or fail to improve as anticipated.  Cipro 1 tablet 3 times daily  Repeat urine in 2-3 weeks

## 2017-07-01 ENCOUNTER — Ambulatory Visit (INDEPENDENT_AMBULATORY_CARE_PROVIDER_SITE_OTHER): Payer: Medicare Other | Admitting: Family Medicine

## 2017-07-01 ENCOUNTER — Encounter: Payer: Self-pay | Admitting: Family Medicine

## 2017-07-01 VITALS — BP 120/80 | HR 80 | Temp 98.3°F | Wt 177.5 lb

## 2017-07-01 DIAGNOSIS — R109 Unspecified abdominal pain: Secondary | ICD-10-CM | POA: Diagnosis not present

## 2017-07-01 DIAGNOSIS — R319 Hematuria, unspecified: Secondary | ICD-10-CM | POA: Diagnosis not present

## 2017-07-01 NOTE — Progress Notes (Signed)
Subjective:     Patient ID: Paul Schwartz, male   DOB: Feb 14, 1941, 76 y.o.   MRN: [id number]  HPI Patient seen with some persistent left flank pain radiating anterior toward his lower abdominal region. He was seen here over week ago for same thing. He had had episode of gross hematuria at home but has not had any since then. His urine revealed hematuria and micro-7-10 red blood cells per high-power field. He was given 3 days of Cipro.  He states his pain is relatively mild but sometimes moderate severity. No known injury. No skin rash. No fevers or chills. Slightly worse with movement. Patient does have prior history of kidney stones. He's taken some diclofenac without much improvement. No further episodes of gross hematuria. He had colonoscopy June 2017. No history of diverticulitis. No change in bowel habits. Appetite is good.  Past Medical History:  Diagnosis Date  . Arthritis   . Bilateral shoulder pain   . BPH (benign prostatic hyperplasia)   . GERD (gastroesophageal reflux disease)   . Renal calculus    x2  . Vertigo    dizziness   Past Surgical History:  Procedure Laterality Date  . achilles tendon     surgical repair bilat  . COLONOSCOPY      reports that he quit smoking about 49 years ago. His smoking use included Cigarettes. He has never used smokeless tobacco. He reports that he drinks about 2.4 oz of alcohol per week . He reports that he does not use drugs. family history includes Heart disease in his father. Allergies  Allergen Reactions  . Plasticized Base [Plastibase] Hives and Rash     Review of Systems  Constitutional: Negative for chills and fever.  Respiratory: Negative for shortness of breath.   Cardiovascular: Negative for chest pain.  Gastrointestinal: Positive for abdominal pain. Negative for blood in stool, nausea and vomiting.  Genitourinary: Positive for flank pain. Negative for frequency.  Skin: Negative for rash.       Objective:   Physical  Exam  Constitutional: He appears well-developed and well-nourished.  Cardiovascular: Normal rate and regular rhythm.   Pulmonary/Chest: Effort normal and breath sounds normal. No respiratory distress. He has no wheezes. He has no rales.  Abdominal: Soft. Bowel sounds are normal. He exhibits no distension and no mass. There is no rebound and no guarding.  Only very minimal tenderness to deep palpation left lower quadrant. No guarding or rebound.  Skin: No rash noted.       Assessment:     Patient presents with recent singular episode of gross hematuria and persistent left flank pain with radiation anterior and somewhat inferior    Plan:     -Set up CT renal stone protocol to further assess -Follow-up immediately for any fever, rash, or other new symptoms  Eulas Post MD [facility]

## 2017-07-01 NOTE — Patient Instructions (Signed)
We will be getting CT renal study Stay well hydrated Follow up for any fever or recurrent bloody stools

## 2017-07-03 ENCOUNTER — Other Ambulatory Visit: Payer: Self-pay | Admitting: Internal Medicine

## 2017-07-03 ENCOUNTER — Ambulatory Visit (INDEPENDENT_AMBULATORY_CARE_PROVIDER_SITE_OTHER)
Admission: RE | Admit: 2017-07-03 | Discharge: 2017-07-03 | Disposition: A | Discharge status: Home / Self Care | Payer: Medicare Other | Source: Ambulatory Visit | Attending: Family Medicine | Admitting: Family Medicine

## 2017-07-03 DIAGNOSIS — R319 Hematuria, unspecified: Secondary | ICD-10-CM | POA: Diagnosis not present

## 2017-07-03 DIAGNOSIS — I10 Essential (primary) hypertension: Secondary | ICD-10-CM

## 2017-07-03 DIAGNOSIS — E291 Testicular hypofunction: Secondary | ICD-10-CM

## 2017-07-03 DIAGNOSIS — R109 Unspecified abdominal pain: Secondary | ICD-10-CM | POA: Diagnosis not present

## 2017-07-07 ENCOUNTER — Other Ambulatory Visit: Payer: Medicare Other

## 2017-07-08 ENCOUNTER — Ambulatory Visit (INDEPENDENT_AMBULATORY_CARE_PROVIDER_SITE_OTHER): Payer: Medicare Other | Admitting: Internal Medicine

## 2017-07-08 ENCOUNTER — Encounter: Payer: Self-pay | Admitting: Internal Medicine

## 2017-07-08 VITALS — BP 138/82 | HR 70 | Temp 98.0°F | Wt 180.0 lb

## 2017-07-08 DIAGNOSIS — I10 Essential (primary) hypertension: Secondary | ICD-10-CM

## 2017-07-08 DIAGNOSIS — Z87442 Personal history of urinary calculi: Secondary | ICD-10-CM | POA: Diagnosis not present

## 2017-07-08 DIAGNOSIS — Z23 Encounter for immunization: Secondary | ICD-10-CM | POA: Diagnosis not present

## 2017-07-08 DIAGNOSIS — R1010 Upper abdominal pain, unspecified: Secondary | ICD-10-CM

## 2017-07-08 DIAGNOSIS — M1711 Unilateral primary osteoarthritis, right knee: Secondary | ICD-10-CM | POA: Diagnosis not present

## 2017-07-08 DIAGNOSIS — M1712 Unilateral primary osteoarthritis, left knee: Secondary | ICD-10-CM | POA: Diagnosis not present

## 2017-07-08 DIAGNOSIS — M170 Bilateral primary osteoarthritis of knee: Secondary | ICD-10-CM | POA: Diagnosis not present

## 2017-07-08 MED ORDER — DICLOFENAC SODIUM 75 MG PO TBEC: 75.0000 mg | DELAYED_RELEASE_TABLET | Freq: Two times a day (BID) | ORAL | 3 refills | Status: DC

## 2017-07-08 MED ORDER — CIPROFLOXACIN HCL 500 MG PO TABS: 500.0000 mg | ORAL_TABLET | Freq: Two times a day (BID) | ORAL | 0 refills | Status: DC

## 2017-07-08 NOTE — Patient Instructions (Signed)
Report any further blood in the urine  Call or return to clinic prn if these symptoms worsen or fail to improve as anticipated.

## 2017-07-08 NOTE — Progress Notes (Signed)
Subjective:    Patient ID: Paul Schwartz, male    DOB: 19-May-1941, 76 y.o.   MRN: [id number]  HPI   76 year old patient who is seen today in follow-up.  He was seen earlier several days after single episode of apparent gross hematuria.  He continues to have very mild discomfort in the left upper quadrant and left flank area.  The pain is aggravated by movement such as turning in bed.  He has had a recent CT urogram, which did reveal a bladder wall diverticulum but no serious pathology. He returned yesterday for a urinalysis that revealed no microscopic hematuria.  He generally feels well today. He is looking forward to a trip to [place] and is requesting antibiotic prophylaxis for possible traveler's diarrhea  IMPRESSION: 1. No acute findings. No obstructing renal or ureteral calculi. No perinephric fluid or inflammation. No bladder stone or bladder wall thickening seen. 2. Prostate gland enlargement, measuring 6.2 x 5.2 x 6 cm, causing slight mass effect on the bladder base. Recommend correlation with physical exam findings and/or PSA lab values. 3. 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 4. Hepatic and left renal cysts. 5. Aortic atherosclerosis. 6. Degenerative changes of the lumbar spine, at least moderate in degree with associated disc space narrowings and osseous spurring. Associated osseous neural foramen narrowings at multiple levels with possible associated nerve root impingement. If any radiculopathic symptoms or chronic low back pain, would consider further characterization with nonemergent lumbar spine MRI.  Past Medical History:  Diagnosis Date  . Arthritis   . Bilateral shoulder pain   . BPH (benign prostatic hyperplasia)   . GERD (gastroesophageal reflux disease)   . Renal calculus    x2  . Vertigo    dizziness     Social History   Social History  . Marital status: Married    Spouse name: N/A  . Number of children: N/A  . Years of education:  N/A   Occupational History  . Not on file.   Social History Main Topics  . Smoking status: Former Smoker    Types: Cigarettes    Quit date: 04/07/1968  . Smokeless tobacco: Never Used  . Alcohol use 2.4 oz/week    4 Glasses of wine per week     Comment: weekends  . Drug use: No  . Sexual activity: Yes   Other Topics Concern  . Not on file   Social History Narrative  . No narrative on file    Past Surgical History:  Procedure Laterality Date  . achilles tendon     surgical repair bilat  . COLONOSCOPY      Family History  Problem Relation Age of Onset  . Heart disease Father   . Colon cancer Neg Hx   . Esophageal cancer Neg Hx   . Stomach cancer Neg Hx   . Rectal cancer Neg Hx     Allergies  Allergen Reactions  . Plasticized Base [Plastibase] Hives and Rash    Current Outpatient Prescriptions on File Prior to Visit  Medication Sig Dispense Refill  . aspirin 81 MG tablet Take 81 mg by mouth 2 (two) times daily.      Marland Kitchen LORazepam (ATIVAN) 1 MG tablet TAKE 1 TABLET 3 TIMES A DAY AS NEEDED FOR ANXIETY 60 tablet 2  . Multiple Vitamin (MULTIVITAMIN) tablet Take 1 tablet by mouth daily.      . NON FORMULARY Zipcor 25 mg; q qid for arthritic pain     .  Omega-3 Fatty Acids (OMEGA 3 PO) Take by mouth.    . tadalafil (CIALIS) 10 MG tablet Take 1 tablet (10 mg total) by mouth daily as needed for erectile dysfunction. 10 tablet 6   Current Facility-Administered Medications on File Prior to Visit  Medication Dose Route Frequency Provider Last Rate Last Dose  . 0.9 %  sodium chloride infusion  500 mL Intravenous Continuous Ladene Artist, MD        BP 138/82 (BP Location: Left Arm, Patient Position: Sitting, Cuff Size: Normal)   Pulse 70   Temp 98 F (36.7 C) (Oral)   Wt 180 lb (81.6 kg)   SpO2 98%   BMI 28.19 kg/m     Review of Systems  Constitutional: Negative for appetite change, chills, fatigue and fever.  HENT: Negative for congestion, dental problem, ear  pain, hearing loss, sore throat, tinnitus, trouble swallowing and voice change.   Eyes: Negative for pain, discharge and visual disturbance.  Respiratory: Negative for cough, chest tightness, wheezing and stridor.   Cardiovascular: Negative for chest pain, palpitations and leg swelling.  Gastrointestinal: Positive for abdominal pain. Negative for abdominal distention, blood in stool, constipation, diarrhea, nausea and vomiting.  Genitourinary: Positive for flank pain. Negative for difficulty urinating, discharge, genital sores, hematuria and urgency.  Musculoskeletal: Negative for arthralgias, back pain, gait problem, joint swelling, myalgias and neck stiffness.  Skin: Negative for rash.  Neurological: Negative for dizziness, syncope, speech difficulty, weakness, numbness and headaches.  Hematological: Negative for adenopathy. Does not bruise/bleed easily.  Psychiatric/Behavioral: Negative for behavioral problems and dysphoric mood. The patient is not nervous/anxious.        Objective:   Physical Exam  Constitutional: He is oriented to person, place, and time. He appears well-developed.  HENT:  Head: Normocephalic.  Right Ear: External ear normal.  Left Ear: External ear normal.  Eyes: Conjunctivae and EOM are normal.  Neck: Normal range of motion.  Cardiovascular: Normal rate and normal heart sounds.   Pulmonary/Chest: Breath sounds normal.  Abdominal: Soft. Bowel sounds are normal. He exhibits no distension and no mass. There is no tenderness. There is no rebound and no guarding.  Musculoskeletal: Normal range of motion. He exhibits no edema or tenderness.  Neurological: He is alert and oriented to person, place, and time.  Psychiatric: He has a normal mood and affect. His behavior is normal.          Assessment & Plan:   Mild left flank and left upper quadrant discomfort.  Possibly referred pain from significant spinal osteoarthritis History of nephrolithiasis History gross  hematuria.  We'll continue to observe  Follow-up 3-4 months.  Patient will report any new or worsening symptoms or recurrent hematuria  Paul Schwartz

## 2017-07-20 DIAGNOSIS — M7541 Impingement syndrome of right shoulder: Secondary | ICD-10-CM | POA: Diagnosis not present

## 2017-07-20 DIAGNOSIS — M19012 Primary osteoarthritis, left shoulder: Secondary | ICD-10-CM | POA: Diagnosis not present

## 2017-07-20 DIAGNOSIS — M25512 Pain in left shoulder: Secondary | ICD-10-CM | POA: Diagnosis not present

## 2017-07-20 DIAGNOSIS — M25511 Pain in right shoulder: Secondary | ICD-10-CM | POA: Diagnosis not present

## 2017-10-30 DIAGNOSIS — M7542 Impingement syndrome of left shoulder: Secondary | ICD-10-CM | POA: Diagnosis not present

## 2017-10-30 DIAGNOSIS — M7541 Impingement syndrome of right shoulder: Secondary | ICD-10-CM | POA: Diagnosis not present

## 2017-11-25 ENCOUNTER — Ambulatory Visit (INDEPENDENT_AMBULATORY_CARE_PROVIDER_SITE_OTHER): Payer: Medicare Other | Admitting: Internal Medicine

## 2017-11-25 ENCOUNTER — Encounter: Payer: Self-pay | Admitting: Internal Medicine

## 2017-11-25 VITALS — BP 130/70 | HR 73 | Temp 97.6°F | Ht 67.0 in | Wt 177.0 lb

## 2017-11-25 DIAGNOSIS — K219 Gastro-esophageal reflux disease without esophagitis: Secondary | ICD-10-CM | POA: Diagnosis not present

## 2017-11-25 DIAGNOSIS — Z86010 Personal history of colonic polyps: Secondary | ICD-10-CM | POA: Diagnosis not present

## 2017-11-25 DIAGNOSIS — E291 Testicular hypofunction: Secondary | ICD-10-CM | POA: Diagnosis not present

## 2017-11-25 DIAGNOSIS — Z0000 Encounter for general adult medical examination without abnormal findings: Secondary | ICD-10-CM

## 2017-11-25 DIAGNOSIS — I10 Essential (primary) hypertension: Secondary | ICD-10-CM

## 2017-11-25 DIAGNOSIS — Z125 Encounter for screening for malignant neoplasm of prostate: Secondary | ICD-10-CM | POA: Diagnosis not present

## 2017-11-25 LAB — HEPATIC FUNCTION PANEL
ALK PHOS: 48 U/L (ref 39–117)
ALT: 19 U/L (ref 0–53)
AST: 20 U/L (ref 0–37)
Albumin: 4.3 g/dL (ref 3.5–5.2)
BILIRUBIN DIRECT: 0.1 mg/dL (ref 0.0–0.3)
BILIRUBIN TOTAL: 0.7 mg/dL (ref 0.2–1.2)
Total Protein: 6.6 g/dL (ref 6.0–8.3)

## 2017-11-25 LAB — CBC WITH DIFFERENTIAL/PLATELET
BASOS ABS: 0 10*3/uL (ref 0.0–0.1)
Basophils Relative: 0.6 % (ref 0.0–3.0)
Eosinophils Absolute: 0.1 10*3/uL (ref 0.0–0.7)
Eosinophils Relative: 2.2 % (ref 0.0–5.0)
HCT: 46 % (ref 39.0–52.0)
Hemoglobin: 15.5 g/dL (ref 13.0–17.0)
LYMPHS ABS: 1.2 10*3/uL (ref 0.7–4.0)
Lymphocytes Relative: 19.9 % (ref 12.0–46.0)
MCHC: 33.7 g/dL (ref 30.0–36.0)
MCV: 98.9 fl (ref 78.0–100.0)
MONOS PCT: 6.4 % (ref 3.0–12.0)
Monocytes Absolute: 0.4 10*3/uL (ref 0.1–1.0)
NEUTROS ABS: 4.2 10*3/uL (ref 1.4–7.7)
NEUTROS PCT: 70.9 % (ref 43.0–77.0)
Platelets: 175 10*3/uL (ref 150.0–400.0)
RBC: 4.65 Mil/uL (ref 4.22–5.81)
RDW: 13 % (ref 11.5–15.5)
WBC: 6 10*3/uL (ref 4.0–10.5)

## 2017-11-25 LAB — BASIC METABOLIC PANEL
BUN: 16 mg/dL (ref 6–23)
CHLORIDE: 101 meq/L (ref 96–112)
CO2: 34 mEq/L — ABNORMAL HIGH (ref 19–32)
Calcium: 9.5 mg/dL (ref 8.4–10.5)
Creatinine, Ser: 0.84 mg/dL (ref 0.40–1.50)
GFR: 94.04 mL/min (ref >60.00)
Glucose, Bld: 88 mg/dL (ref 70–99)
POTASSIUM: 3.8 meq/L (ref 3.5–5.1)
SODIUM: 142 meq/L (ref 135–145)

## 2017-11-25 LAB — LIPID PANEL
CHOL/HDL RATIO: 3
Cholesterol: 191 mg/dL (ref 0–200)
HDL: 71.7 mg/dL (ref >39.00)
LDL CALC: 106 mg/dL — AB (ref 0–99)
NonHDL: 119.54
Triglycerides: 69 mg/dL (ref 0.0–149.0)
VLDL: 13.8 mg/dL (ref 0.0–40.0)

## 2017-11-25 LAB — TSH: TSH: 2.56 u[IU]/mL (ref 0.35–4.50)

## 2017-11-25 LAB — PSA: PSA: 5.29 ng/mL — AB (ref 0.10–4.00)

## 2017-11-25 NOTE — Progress Notes (Addendum)
Subjective:    Patient ID: Paul Schwartz, male    DOB: [birth year], 77 y.o.   MRN: [id number]  HPI  77 year old patient who is seen today for a preventive health examination and subsequent Medicare wellness visit He has a history of colonic polyps.  Last colonoscopy June 2017.  He has essential hypertension which more recently has been controlled off medications.  He has a history of nephrolithiasis which has been stable.  While visiting in [place] he had some nonspecific chest discomfort and was told that he had a heart murmur.  Past Medical History:  Diagnosis Date  . Arthritis   . Bilateral shoulder pain   . BPH (benign prostatic hyperplasia)   . GERD (gastroesophageal reflux disease)   . Renal calculus    x2  . Vertigo    dizziness     Social History   Socioeconomic History  . Marital status: Married    Spouse name: Not on file  . Number of children: Not on file  . Years of education: Not on file  . Highest education level: Not on file  Social Needs  . Financial resource strain: Not on file  . Food insecurity - worry: Not on file  . Food insecurity - inability: Not on file  . Transportation needs - medical: Not on file  . Transportation needs - non-medical: Not on file  Occupational History  . Not on file  Tobacco Use  . Smoking status: Former Smoker    Types: Cigarettes    Last attempt to quit: 04/07/1968    Years since quitting: 49.6  . Smokeless tobacco: Never Used  Substance and Sexual Activity  . Alcohol use: Yes    Alcohol/week: 2.4 oz    Types: 4 Glasses of wine per week    Comment: weekends  . Drug use: No  . Sexual activity: Yes  Other Topics Concern  . Not on file  Social History Narrative  . Not on file    Past Surgical History:  Procedure Laterality Date  . achilles tendon     surgical repair bilat  . COLONOSCOPY      Family History  Problem Relation Age of Onset  . Heart disease Father   . Colon cancer Neg Hx   . Esophageal  cancer Neg Hx   . Stomach cancer Neg Hx   . Rectal cancer Neg Hx     Allergies  Allergen Reactions  . Plasticized Base [Plastibase] Hives and Rash    Current Outpatient Medications on File Prior to Visit  Medication Sig Dispense Refill  . aspirin 81 MG tablet Take 81 mg by mouth 2 (two) times daily.      . diclofenac (VOLTAREN) 75 MG EC tablet Take 1 tablet (75 mg total) by mouth 2 (two) times daily. 60 tablet 3  . LORazepam (ATIVAN) 1 MG tablet TAKE 1 TABLET 3 TIMES A DAY AS NEEDED FOR ANXIETY 60 tablet 2  . Multiple Vitamin (MULTIVITAMIN) tablet Take 1 tablet by mouth daily.      . NON FORMULARY Zipcor 25 mg; q qid for arthritic pain     . Omega-3 Fatty Acids (OMEGA 3 PO) Take by mouth.    . tadalafil (CIALIS) 10 MG tablet Take 1 tablet (10 mg total) by mouth daily as needed for erectile dysfunction. 10 tablet 6   Current Facility-Administered Medications on File Prior to Visit  Medication Dose Route Frequency Provider Last Rate Last Dose  . 0.9 %  sodium chloride  infusion  500 mL Intravenous Continuous Ladene Artist, MD        BP 130/70 (BP Location: Left Arm, Patient Position: Sitting, Cuff Size: Normal)   Pulse 73   Temp 97.6 F (36.4 C) (Oral)   Ht 5\' 7"  (1.702 m)   Wt 177 lb (80.3 kg)   SpO2 98%   BMI 27.72 kg/m   Subsequent Medicare wellness visit  1. Risk factors, based on past  M,S,F history-cardiovascular risk factors include a history of hypertension.  2.  Physical activities: Remains quite active.  Does participate in activities at his health club  3.  Depression/mood: No history of major depression or mood disorder  4.  Hearing: No deficits  5.  ADL's: Independent  6.  Fall risk: Low  7.  Home safety: No problems identified  8.  Height weight, and visual acuity;  height and weight stable no change in visual acuity has had right cataract extraction surgery.  He is scheduled for his annual eye examination within the next few weeks 9.  Counseling:  Continue active lifestyle and exercise program.  Modest weight loss encouraged  10. Lab orders based on risk factors: Laboratory update will be reviewed  11. Referral : Follow-up ophthalmology  12. Care plan: Continue efforts at aggressive risk factor modification  13. Cognitive assessment: Alert and appropriate normal affect.  No cognitive dysfunction  14. Screening: Patient provided with a written and personalized 5-10 year screening schedule in the AVS.    15. Provider List Update: Primary care ophthalmology  .  Review of Systems  Constitutional: Negative for activity change, appetite change, chills, fatigue and fever.  HENT: Negative for congestion, dental problem, ear pain, hearing loss, mouth sores, rhinorrhea, sinus pressure, sneezing, tinnitus, trouble swallowing and voice change.   Eyes: Negative for photophobia, pain, redness and visual disturbance.  Respiratory: Negative for apnea, cough, choking, chest tightness, shortness of breath and wheezing.   Cardiovascular: Negative for chest pain, palpitations and leg swelling.  Gastrointestinal: Negative for abdominal distention, abdominal pain, anal bleeding, blood in stool, constipation, diarrhea, nausea, rectal pain and vomiting.  Genitourinary: Negative for decreased urine volume, difficulty urinating, discharge, dysuria, flank pain, frequency, genital sores, hematuria, penile swelling, scrotal swelling, testicular pain and urgency.  Musculoskeletal: Negative for arthralgias, back pain, gait problem, joint swelling, myalgias, neck pain and neck stiffness.  Skin: Negative for color change, rash and wound.  Neurological: Negative for dizziness, tremors, seizures, syncope, facial asymmetry, speech difficulty, weakness, light-headedness, numbness and headaches.  Hematological: Negative for adenopathy. Does not bruise/bleed easily.  Psychiatric/Behavioral: Negative for agitation, behavioral problems, confusion, decreased concentration,  dysphoric mood, hallucinations, self-injury, sleep disturbance and suicidal ideas. The patient is not nervous/anxious.        Objective:   Physical Exam  Constitutional: He appears well-developed and well-nourished.  HENT:  Head: Normocephalic and atraumatic.  Right Ear: External ear normal.  Left Ear: External ear normal.  Nose: Nose normal.  Mouth/Throat: Oropharynx is clear and moist.  Eyes: Conjunctivae and EOM are normal. Pupils are equal, round, and reactive to light. No scleral icterus.  Neck: Normal range of motion. Neck supple. No JVD present. No thyromegaly present.  Cardiovascular: Regular rhythm and intact distal pulses. Exam reveals no gallop and no friction rub.  Murmur heard. Pedal pulses faint  Grade 2-6/8 systolic murmur heard at the left upper sternal border  Pulmonary/Chest: Effort normal and breath sounds normal. He exhibits no tenderness.  Abdominal: Soft. Bowel sounds are normal. He exhibits no  distension and no mass. There is no tenderness.  Genitourinary: Penis normal. Rectal exam shows guaiac negative stool.  Genitourinary Comments: Prostate +3 enlarged  Musculoskeletal: Normal range of motion. He exhibits no edema or tenderness.  Lymphadenopathy:    He has no cervical adenopathy.  Neurological: He is alert. He has normal reflexes. No cranial nerve deficit. Coordination normal.  Skin: Skin is warm and dry. No rash noted.  Psychiatric: He has a normal mood and affect. His behavior is normal.          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  Consider cologuard testing at age 21  Preventive health: History of BPH/elevated PSA.  Will check PSA to screen for prostate cancer    Review screening lab Medications updated  [name]

## 2017-11-25 NOTE — Patient Instructions (Addendum)
Limit your sodium (Salt) intake    It is important that you exercise regularly, at least 20 minutes 3 to 4 times per week.  If you develop chest pain or shortness of breath seek  medical attention.  Please check your blood pressure on a regular basis.  If it is consistently greater than 150/90, please make an office appointment.   DASH Eating Plan DASH stands for "Dietary Approaches to Stop Hypertension." The DASH eating plan is a healthy eating plan that has been shown to reduce high blood pressure (hypertension). It may also reduce your risk for type 2 diabetes, heart disease, and stroke. The DASH eating plan may also help with weight loss. What are tips for following this plan? General guidelines  Avoid eating more than 2,300 mg (milligrams) of salt (sodium) a day. If you have hypertension, you may need to reduce your sodium intake to 1,500 mg a day.  Limit alcohol intake to no more than 1 drink a day for nonpregnant women and 2 drinks a day for men. One drink equals 12 oz of beer, 5 oz of wine, or 1 oz of hard liquor.  Work with your health care provider to maintain a healthy body weight or to lose weight. Ask what an ideal weight is for you.  Get at least 30 minutes of exercise that causes your heart to beat faster (aerobic exercise) most days of the week. Activities may include walking, swimming, or biking.  Work with your health care provider or diet and nutrition specialist (dietitian) to adjust your eating plan to your individual calorie needs. Reading food labels  Check food labels for the amount of sodium per serving. Choose foods with less than 5 percent of the Daily Value of sodium. Generally, foods with less than 300 mg of sodium per serving fit into this eating plan.  To find whole grains, look for the word "whole" as the first word in the ingredient list. Shopping  Buy products labeled as "low-sodium" or "no salt added."  Buy fresh foods. Avoid canned foods and premade  or frozen meals. Cooking  Avoid adding salt when cooking. Use salt-free seasonings or herbs instead of table salt or sea salt. Check with your health care provider or pharmacist before using salt substitutes.  Do not fry foods. Cook foods using healthy methods such as baking, boiling, grilling, and broiling instead.  Cook with heart-healthy oils, such as olive, canola, soybean, or sunflower oil. Meal planning   Eat a balanced diet that includes: ? 5 or more servings of fruits and vegetables each day. At each meal, try to fill half of your plate with fruits and vegetables. ? Up to 6-8 servings of whole grains each day. ? Less than 6 oz of lean meat, poultry, or fish each day. A 3-oz serving of meat is about the same size as a deck of cards. One egg equals 1 oz. ? 2 servings of low-fat dairy each day. ? A serving of nuts, seeds, or beans 5 times each week. ? Heart-healthy fats. Healthy fats called Omega-3 fatty acids are found in foods such as flaxseeds and coldwater fish, like sardines, salmon, and mackerel.  Limit how much you eat of the following: ? Canned or prepackaged foods. ? Food that is high in trans fat, such as fried foods. ? Food that is high in saturated fat, such as fatty meat. ? Sweets, desserts, sugary drinks, and other foods with added sugar. ? Full-fat dairy products.  Do not salt foods  before eating.  Try to eat at least 2 vegetarian meals each week.  Eat more home-cooked food and less restaurant, buffet, and fast food.  When eating at a restaurant, ask that your food be prepared with less salt or no salt, if possible. What foods are recommended? The items listed may not be a complete list. Talk with your dietitian about what dietary choices are best for you. Grains Whole-grain or whole-wheat bread. Whole-grain or whole-wheat pasta. Brown rice. [place]row. Bulgur. Whole-grain and low-sodium cereals. Pita bread. Low-fat, low-sodium crackers. Whole-wheat flour  tortillas. Vegetables Fresh or frozen vegetables (raw, steamed, roasted, or grilled). Low-sodium or reduced-sodium tomato and vegetable juice. Low-sodium or reduced-sodium tomato sauce and tomato paste. Low-sodium or reduced-sodium canned vegetables. Fruits All fresh, dried, or frozen fruit. Canned fruit in natural juice (without added sugar). Meat and other protein foods Skinless chicken or [place]. Ground chicken or [place]. Pork with fat trimmed off. Fish and seafood. Egg whites. Dried beans, peas, or lentils. Unsalted nuts, nut butters, and seeds. Unsalted canned beans. Lean cuts of beef with fat trimmed off. Low-sodium, lean deli meat. Dairy Low-fat (1%) or fat-free (skim) milk. Fat-free, low-fat, or reduced-fat cheeses. Nonfat, low-sodium ricotta or cottage cheese. Low-fat or nonfat yogurt. Low-fat, low-sodium cheese. Fats and oils Soft margarine without trans fats. Vegetable oil. Low-fat, reduced-fat, or light mayonnaise and salad dressings (reduced-sodium). Canola, safflower, olive, soybean, and sunflower oils. Avocado. Seasoning and other foods Herbs. Spices. Seasoning mixes without salt. Unsalted popcorn and pretzels. Fat-free sweets. What foods are not recommended? The items listed may not be a complete list. Talk with your dietitian about what dietary choices are best for you. Grains Baked goods made with fat, such as croissants, muffins, or some breads. Dry pasta or rice meal packs. Vegetables Creamed or fried vegetables. Vegetables in a cheese sauce. Regular canned vegetables (not low-sodium or reduced-sodium). Regular canned tomato sauce and paste (not low-sodium or reduced-sodium). Regular tomato and vegetable juice (not low-sodium or reduced-sodium). Angie Fava. Olives. Fruits Canned fruit in a light or heavy syrup. Fried fruit. Fruit in cream or butter sauce. Meat and other protein foods Fatty cuts of meat. Ribs. Fried meat. Berniece Salines. Sausage. Bologna and other processed lunch meats.  Salami. Fatback. Hotdogs. Bratwurst. Salted nuts and seeds. Canned beans with added salt. Canned or smoked fish. Whole eggs or egg yolks. Chicken or [place] with skin. Dairy Whole or 2% milk, cream, and half-and-half. Whole or full-fat cream cheese. Whole-fat or sweetened yogurt. Full-fat cheese. Nondairy creamers. Whipped toppings. Processed cheese and cheese spreads. Fats and oils Butter. Stick margarine. Lard. Shortening. Ghee. Bacon fat. Tropical oils, such as coconut, palm kernel, or palm oil. Seasoning and other foods Salted popcorn and pretzels. Onion salt, garlic salt, seasoned salt, table salt, and sea salt. Worcestershire sauce. Tartar sauce. Barbecue sauce. Teriyaki sauce. Soy sauce, including reduced-sodium. Steak sauce. Canned and packaged gravies. Fish sauce. Oyster sauce. Cocktail sauce. Horseradish that you find on the shelf. Ketchup. Mustard. Meat flavorings and tenderizers. Bouillon cubes. Hot sauce and Tabasco sauce. Premade or packaged marinades. Premade or packaged taco seasonings. Relishes. Regular salad dressings. Where to find more information:  National Heart, Lung, and [place]: https://wilson-eaton.com/  American Heart Association: www.heart.org Summary  The DASH eating plan is a healthy eating plan that has been shown to reduce high blood pressure (hypertension). It may also reduce your risk for type 2 diabetes, heart disease, and stroke.  With the DASH eating plan, you should limit salt (sodium) intake to 2,300 mg  a day. If you have hypertension, you may need to reduce your sodium intake to 1,500 mg a day.  When on the DASH eating plan, aim to eat more fresh fruits and vegetables, whole grains, lean proteins, low-fat dairy, and heart-healthy fats.  Work with your health care provider or diet and nutrition specialist (dietitian) to adjust your eating plan to your individual calorie needs. This information is not intended to replace advice given to you by your health  care provider. Make sure you discuss any questions you have with your health care provider. Document Released: 10/16/2011 Document Revised: 10/20/2016 Document Reviewed: 10/20/2016 Elsevier Interactive Patient Education  [employer].

## 2017-12-02 DIAGNOSIS — H353132 Nonexudative age-related macular degeneration, bilateral, intermediate dry stage: Secondary | ICD-10-CM | POA: Diagnosis not present

## 2017-12-02 DIAGNOSIS — H2512 Age-related nuclear cataract, left eye: Secondary | ICD-10-CM | POA: Diagnosis not present

## 2017-12-02 DIAGNOSIS — H40023 Open angle with borderline findings, high risk, bilateral: Secondary | ICD-10-CM | POA: Diagnosis not present

## 2017-12-02 DIAGNOSIS — H25012 Cortical age-related cataract, left eye: Secondary | ICD-10-CM | POA: Diagnosis not present

## 2017-12-22 DIAGNOSIS — H25812 Combined forms of age-related cataract, left eye: Secondary | ICD-10-CM | POA: Diagnosis not present

## 2017-12-22 DIAGNOSIS — H2512 Age-related nuclear cataract, left eye: Secondary | ICD-10-CM | POA: Diagnosis not present

## 2018-01-28 ENCOUNTER — Ambulatory Visit (INDEPENDENT_AMBULATORY_CARE_PROVIDER_SITE_OTHER): Payer: Medicare Other | Admitting: Internal Medicine

## 2018-01-28 ENCOUNTER — Encounter: Payer: Self-pay | Admitting: Internal Medicine

## 2018-01-28 VITALS — BP 140/80 | HR 85 | Temp 97.9°F | Wt 178.0 lb

## 2018-01-28 DIAGNOSIS — I10 Essential (primary) hypertension: Secondary | ICD-10-CM

## 2018-01-28 DIAGNOSIS — Z87442 Personal history of urinary calculi: Secondary | ICD-10-CM | POA: Diagnosis not present

## 2018-01-28 DIAGNOSIS — R3129 Other microscopic hematuria: Secondary | ICD-10-CM

## 2018-01-28 LAB — POCT URINALYSIS DIPSTICK
Bilirubin, UA: NEGATIVE
GLUCOSE UA: NEGATIVE
Ketones, UA: NEGATIVE
LEUKOCYTES UA: NEGATIVE
Nitrite, UA: NEGATIVE
Spec Grav, UA: >=1.03 — AB (ref 1.010–1.025)
Urobilinogen, UA: 0.2 E.U./dL
pH, UA: 6 (ref 5.0–8.0)

## 2018-01-28 MED ORDER — CELECOXIB 200 MG PO CAPS: 200.0000 mg | ORAL_CAPSULE | Freq: Two times a day (BID) | ORAL | 4 refills | Status: DC

## 2018-01-28 NOTE — Patient Instructions (Signed)
Drink as much fluid as you  can tolerate over the next few days  Celebrex 200 mg twice daily as needed for shoulder pain  Urology consultation

## 2018-01-28 NOTE — Progress Notes (Signed)
   Subjective:    Patient ID: Paul Schwartz, male    DOB: 1941/10/01, 77 y.o.   MRN: [id number]  HPI 77 year old patient who has a history of prior renal stones.  For the past week he has had some minor left flank discomfort.  He has been checking his urine closely and at times he feels there has been some particulate matter in the urine.  Over the years he has had 2 prior episodes of renal colic. In August of last year he had similar left flank discomfort and a CT renal stone study was performed that revealed no acute findings and no obstructing renal or ureteral calculi.  There was significant prostate gland enlargement as well as a large bladder diverticulum.  Considerable degenerative changes were noted in the lumbar spine The patient also complains of significant bilateral shoulder pain.  He has been treated with Celebrex in the past with nice results  Urinalysis today revealed +3 hematuria  Past Medical History:  Diagnosis Date  . Arthritis   . Bilateral shoulder pain   . BPH (benign prostatic hyperplasia)   . GERD (gastroesophageal reflux disease)   . Renal calculus    x2  . Vertigo    dizziness       Review of Systems  Constitutional: Negative for appetite change, chills, fatigue and fever.  HENT: Negative for congestion, dental problem, ear pain, hearing loss, sore throat, tinnitus, trouble swallowing and voice change.   Eyes: Negative for pain, discharge and visual disturbance.  Respiratory: Negative for cough, chest tightness, wheezing and stridor.   Cardiovascular: Negative for chest pain, palpitations and leg swelling.  Gastrointestinal: Negative for abdominal distention, abdominal pain, blood in stool, constipation, diarrhea, nausea and vomiting.  Genitourinary: Positive for flank pain and urgency. Negative for difficulty urinating, discharge, genital sores and hematuria.  Musculoskeletal: Positive for arthralgias and back pain. Negative for gait problem, joint  swelling, myalgias and neck stiffness.  Skin: Negative for rash.  Neurological: Negative for dizziness, syncope, speech difficulty, weakness, numbness and headaches.  Hematological: Negative for adenopathy. Does not bruise/bleed easily.  Psychiatric/Behavioral: Negative for behavioral problems and dysphoric mood. The patient is not nervous/anxious.        Objective:   Physical Exam  Constitutional: He is oriented to person, place, and time. He appears well-developed.  HENT:  Head: Normocephalic.  Right Ear: External ear normal.  Left Ear: External ear normal.  Eyes: Conjunctivae and EOM are normal.  Neck: Normal range of motion.  Cardiovascular: Normal rate.  Murmur heard. Pulmonary/Chest: Breath sounds normal.  Abdominal: Bowel sounds are normal.  No CVA tenderness  Musculoskeletal: Normal range of motion. He exhibits no edema or tenderness.  Neurological: He is alert and oriented to person, place, and time.  Psychiatric: He has a normal mood and affect. His behavior is normal.          Assessment & Plan:   Recurrent left flank pain.  He has similar pain 6 months ago and CT renal study did not show any obstructing stones.  Study did show significant degenerative changes of his spine BPH/slight elevated PSA Hematuria.  Possibly secondary to large bladder diverticulum.  CT renal study did not show any bladder wall thickening.  Will evaluate by urology  Osteoarthritis with significant shoulder pain.  Trial Celebrex which has been helpful in the past  [name]

## 2018-02-10 DIAGNOSIS — M25512 Pain in left shoulder: Secondary | ICD-10-CM | POA: Diagnosis not present

## 2018-02-10 DIAGNOSIS — M25511 Pain in right shoulder: Secondary | ICD-10-CM | POA: Diagnosis not present

## 2018-02-15 ENCOUNTER — Encounter: Payer: Self-pay | Admitting: Internal Medicine

## 2018-02-15 ENCOUNTER — Ambulatory Visit (INDEPENDENT_AMBULATORY_CARE_PROVIDER_SITE_OTHER): Payer: Medicare Other | Admitting: Internal Medicine

## 2018-02-15 VITALS — BP 130/70 | HR 78 | Temp 98.4°F | Wt 174.0 lb

## 2018-02-15 DIAGNOSIS — B9789 Other viral agents as the cause of diseases classified elsewhere: Secondary | ICD-10-CM | POA: Diagnosis not present

## 2018-02-15 DIAGNOSIS — I10 Essential (primary) hypertension: Secondary | ICD-10-CM | POA: Diagnosis not present

## 2018-02-15 DIAGNOSIS — J069 Acute upper respiratory infection, unspecified: Secondary | ICD-10-CM

## 2018-02-15 NOTE — Progress Notes (Signed)
Subjective:    Patient ID: Paul Schwartz, male    DOB: [birth year], 77 y.o.   MRN: [id number]  HPI  77 year old patient who presents with a one-week history of cough sore throat chest congestion.  He describes some rhinorrhea.  No fever or wheezing.  No shortness of breath  Past Medical History:  Diagnosis Date  . Arthritis   . Bilateral shoulder pain   . BPH (benign prostatic hyperplasia)   . GERD (gastroesophageal reflux disease)   . Renal calculus    x2  . Vertigo    dizziness     Social History   Socioeconomic History  . Marital status: Married    Spouse name: Not on file  . Number of children: Not on file  . Years of education: Not on file  . Highest education level: Not on file  Occupational History  . Not on file  Social Needs  . Financial resource strain: Not on file  . Food insecurity:    Worry: Not on file    Inability: Not on file  . Transportation needs:    Medical: Not on file    Non-medical: Not on file  Tobacco Use  . Smoking status: Former Smoker    Types: Cigarettes    Last attempt to quit: 04/07/1968    Years since quitting: 49.8  . Smokeless tobacco: Never Used  Substance and Sexual Activity  . Alcohol use: Yes    Alcohol/week: 2.4 oz    Types: 4 Glasses of wine per week    Comment: weekends  . Drug use: No  . Sexual activity: Yes  Lifestyle  . Physical activity:    Days per week: Not on file    Minutes per session: Not on file  . Stress: Not on file  Relationships  . Social connections:    Talks on phone: Not on file    Gets together: Not on file    Attends religious service: Not on file    Active member of club or organization: Not on file    Attends meetings of clubs or organizations: Not on file    Relationship status: Not on file  . Intimate partner violence:    Fear of current or ex partner: Not on file    Emotionally abused: Not on file    Physically abused: Not on file    Forced sexual activity: Not on file  Other  Topics Concern  . Not on file  Social History Narrative  . Not on file    Past Surgical History:  Procedure Laterality Date  . achilles tendon     surgical repair bilat  . COLONOSCOPY      Family History  Problem Relation Age of Onset  . Heart disease Father   . Colon cancer Neg Hx   . Esophageal cancer Neg Hx   . Stomach cancer Neg Hx   . Rectal cancer Neg Hx     Allergies  Allergen Reactions  . Plasticized Base [Plastibase] Hives and Rash    Current Outpatient Medications on File Prior to Visit  Medication Sig Dispense Refill  . aspirin 81 MG tablet Take 81 mg by mouth 2 (two) times daily.      . celecoxib (CELEBREX) 200 MG capsule Take 1 capsule (200 mg total) by mouth 2 (two) times daily. 60 capsule 4  . diclofenac (VOLTAREN) 75 MG EC tablet Take 1 tablet (75 mg total) by mouth 2 (two) times daily. 60 tablet 3  .  LORazepam (ATIVAN) 1 MG tablet TAKE 1 TABLET 3 TIMES A DAY AS NEEDED FOR ANXIETY 60 tablet 2  . Multiple Vitamin (MULTIVITAMIN) tablet Take 1 tablet by mouth daily.      . NON FORMULARY Zipcor 25 mg; q qid for arthritic pain     . Omega-3 Fatty Acids (OMEGA 3 PO) Take by mouth.    . tadalafil (CIALIS) 10 MG tablet Take 1 tablet (10 mg total) by mouth daily as needed for erectile dysfunction. 10 tablet 6   Current Facility-Administered Medications on File Prior to Visit  Medication Dose Route Frequency Provider Last Rate Last Dose  . 0.9 %  sodium chloride infusion  500 mL Intravenous Continuous Ladene Artist, MD        BP 130/70 (BP Location: Right Arm, Patient Position: Sitting, Cuff Size: Large)   Pulse 78   Temp 98.4 F (36.9 C) (Oral)   Wt 174 lb (78.9 kg)   SpO2 97%   BMI 27.25 kg/m     Review of Systems  Constitutional: Positive for activity change and appetite change. Negative for chills, fatigue and fever.  HENT: Positive for congestion, postnasal drip, rhinorrhea and sore throat. Negative for dental problem, ear pain, hearing loss,  tinnitus, trouble swallowing and voice change.   Eyes: Negative for pain, discharge and visual disturbance.  Respiratory: Positive for cough. Negative for chest tightness, wheezing and stridor.   Cardiovascular: Negative for chest pain, palpitations and leg swelling.  Gastrointestinal: Negative for abdominal distention, abdominal pain, blood in stool, constipation, diarrhea, nausea and vomiting.  Genitourinary: Negative for difficulty urinating, discharge, flank pain, genital sores, hematuria and urgency.  Musculoskeletal: Negative for arthralgias, back pain, gait problem, joint swelling, myalgias and neck stiffness.  Skin: Negative for rash.  Neurological: Negative for dizziness, syncope, speech difficulty, weakness, numbness and headaches.  Hematological: Negative for adenopathy. Does not bruise/bleed easily.  Psychiatric/Behavioral: Negative for behavioral problems and dysphoric mood. The patient is not nervous/anxious.        Objective:   Physical Exam  Constitutional: He is oriented to person, place, and time. He appears well-developed. No distress.  No distress Afebrile Normal blood pressure  HENT:  Head: Normocephalic.  Right Ear: External ear normal.  Left Ear: External ear normal.  Eyes: Conjunctivae and EOM are normal.  Neck: Normal range of motion.  Cardiovascular: Normal rate and normal heart sounds.  Pulmonary/Chest: Breath sounds normal. No respiratory distress. He has no wheezes. He has no rales.  Abdominal: Bowel sounds are normal.  Musculoskeletal: Normal range of motion. He exhibits no edema or tenderness.  Neurological: He is alert and oriented to person, place, and time.  Psychiatric: He has a normal mood and affect. His behavior is normal.          Assessment & Plan:   Viral URI with cough.  Will treat symptomatically.  Samples of Mucinex DM as Cepastat dispensed [place]

## 2018-02-15 NOTE — Patient Instructions (Addendum)

## 2018-02-23 DIAGNOSIS — R311 Benign essential microscopic hematuria: Secondary | ICD-10-CM | POA: Diagnosis not present

## 2018-02-23 DIAGNOSIS — R972 Elevated prostate specific antigen [PSA]: Secondary | ICD-10-CM | POA: Diagnosis not present

## 2018-02-23 DIAGNOSIS — N400 Enlarged prostate without lower urinary tract symptoms: Secondary | ICD-10-CM | POA: Diagnosis not present

## 2018-03-01 DIAGNOSIS — M25512 Pain in left shoulder: Secondary | ICD-10-CM | POA: Diagnosis not present

## 2018-03-01 DIAGNOSIS — M25511 Pain in right shoulder: Secondary | ICD-10-CM | POA: Diagnosis not present

## 2018-03-09 DIAGNOSIS — N200 Calculus of kidney: Secondary | ICD-10-CM | POA: Diagnosis not present

## 2018-03-09 DIAGNOSIS — R311 Benign essential microscopic hematuria: Secondary | ICD-10-CM | POA: Diagnosis not present

## 2018-03-15 DIAGNOSIS — M25511 Pain in right shoulder: Secondary | ICD-10-CM | POA: Diagnosis not present

## 2018-03-16 DIAGNOSIS — R311 Benign essential microscopic hematuria: Secondary | ICD-10-CM | POA: Diagnosis not present

## 2018-03-26 DIAGNOSIS — M25511 Pain in right shoulder: Secondary | ICD-10-CM | POA: Diagnosis not present

## 2018-04-15 DIAGNOSIS — D692 Other nonthrombocytopenic purpura: Secondary | ICD-10-CM | POA: Diagnosis not present

## 2018-04-15 DIAGNOSIS — L821 Other seborrheic keratosis: Secondary | ICD-10-CM | POA: Diagnosis not present

## 2018-04-15 DIAGNOSIS — L7211 Pilar cyst: Secondary | ICD-10-CM | POA: Diagnosis not present

## 2018-04-15 DIAGNOSIS — L720 Epidermal cyst: Secondary | ICD-10-CM | POA: Diagnosis not present

## 2018-04-15 DIAGNOSIS — B353 Tinea pedis: Secondary | ICD-10-CM | POA: Diagnosis not present

## 2018-05-17 ENCOUNTER — Encounter: Payer: Self-pay | Admitting: Internal Medicine

## 2018-05-17 ENCOUNTER — Ambulatory Visit (INDEPENDENT_AMBULATORY_CARE_PROVIDER_SITE_OTHER): Payer: Medicare Other | Admitting: Internal Medicine

## 2018-05-17 VITALS — BP 140/82 | HR 72 | Temp 98.1°F | Wt 176.0 lb

## 2018-05-17 DIAGNOSIS — S61412A Laceration without foreign body of left hand, initial encounter: Secondary | ICD-10-CM | POA: Diagnosis not present

## 2018-05-17 DIAGNOSIS — I10 Essential (primary) hypertension: Secondary | ICD-10-CM | POA: Diagnosis not present

## 2018-05-17 NOTE — Progress Notes (Signed)
Subjective:    Patient ID: Paul Schwartz, male    DOB: Jul 08, 1941, 77 y.o.   MRN: [id number]  HPI  77 year old patient who has essential hypertension who is seen today after a injury to the left hand 4 days ago.  He was using a water pressure cleaner and sustained trauma to the dorsal aspect of the left hand with the water jet.  Initially bled profusely.  He has been cleaning daily  He has essential hypertension which has been stable.  Past Medical History:  Diagnosis Date  . Arthritis   . Bilateral shoulder pain   . BPH (benign prostatic hyperplasia)   . GERD (gastroesophageal reflux disease)   . Renal calculus    x2  . Vertigo    dizziness     Social History   Socioeconomic History  . Marital status: Married    Spouse name: Not on file  . Number of children: Not on file  . Years of education: Not on file  . Highest education level: Not on file  Occupational History  . Not on file  Social Needs  . Financial resource strain: Not on file  . Food insecurity:    Worry: Not on file    Inability: Not on file  . Transportation needs:    Medical: Not on file    Non-medical: Not on file  Tobacco Use  . Smoking status: Former Smoker    Types: Cigarettes    Last attempt to quit: 04/07/1968    Years since quitting: 50.1  . Smokeless tobacco: Never Used  Substance and Sexual Activity  . Alcohol use: Yes    Alcohol/week: 2.4 oz    Types: 4 Glasses of wine per week    Comment: weekends  . Drug use: No  . Sexual activity: Yes  Lifestyle  . Physical activity:    Days per week: Not on file    Minutes per session: Not on file  . Stress: Not on file  Relationships  . Social connections:    Talks on phone: Not on file    Gets together: Not on file    Attends religious service: Not on file    Active member of club or organization: Not on file    Attends meetings of clubs or organizations: Not on file    Relationship status: Not on file  . Intimate partner violence:      Fear of current or ex partner: Not on file    Emotionally abused: Not on file    Physically abused: Not on file    Forced sexual activity: Not on file  Other Topics Concern  . Not on file  Social History Narrative  . Not on file    Past Surgical History:  Procedure Laterality Date  . achilles tendon     surgical repair bilat  . COLONOSCOPY      Family History  Problem Relation Age of Onset  . Heart disease Father   . Colon cancer Neg Hx   . Esophageal cancer Neg Hx   . Stomach cancer Neg Hx   . Rectal cancer Neg Hx     Allergies  Allergen Reactions  . Plasticized Base [Plastibase] Hives and Rash    Current Outpatient Medications on File Prior to Visit  Medication Sig Dispense Refill  . aspirin 81 MG tablet Take 81 mg by mouth 2 (two) times daily.      . celecoxib (CELEBREX) 200 MG capsule Take 1 capsule (200 mg  total) by mouth 2 (two) times daily. 60 capsule 4  . LORazepam (ATIVAN) 1 MG tablet TAKE 1 TABLET 3 TIMES A DAY AS NEEDED FOR ANXIETY 60 tablet 2  . Multiple Vitamin (MULTIVITAMIN) tablet Take 1 tablet by mouth daily.      . NON FORMULARY Zipcor 25 mg; q qid for arthritic pain     . Omega-3 Fatty Acids (OMEGA 3 PO) Take by mouth.    . tadalafil (CIALIS) 10 MG tablet Take 1 tablet (10 mg total) by mouth daily as needed for erectile dysfunction. 10 tablet 6   Current Facility-Administered Medications on File Prior to Visit  Medication Dose Route Frequency Provider Last Rate Last Dose  . 0.9 %  sodium chloride infusion  500 mL Intravenous Continuous Ladene Artist, MD        BP 140/82 (BP Location: Right Arm, Patient Position: Sitting, Cuff Size: Normal)   Pulse 72   Temp 98.1 F (36.7 C) (Oral)   Wt 176 lb (79.8 kg)   SpO2 98%   BMI 27.57 kg/m     Review of Systems  Constitutional: Negative.   Musculoskeletal: Positive for arthralgias.       Objective:   Physical Exam  Constitutional: He is oriented to person, place, and time. He appears  well-developed.  HENT:  Head: Normocephalic.  Right Ear: External ear normal.  Left Ear: External ear normal.  Eyes: Conjunctivae and EOM are normal.  Neck: Normal range of motion.  Cardiovascular: Normal rate and normal heart sounds.  Pulmonary/Chest: Breath sounds normal.  Abdominal: Bowel sounds are normal.  Musculoskeletal: Normal range of motion. He exhibits no edema or tenderness.  Neurological: He is alert and oriented to person, place, and time.  Skin:  3 x 4 cm V-shaped superficial avulsion laceration involving the dorsal aspect of the left hand between the thumb and the index finger. The V-shaped flap is fairly well approximated    Psychiatric: He has a normal mood and affect. His behavior is normal.          Assessment & Plan:   Traumatic avulsion laceration dorsal left hand.  The wound was cleaned antibiotic ointment applied and dressed;  local wound care and additional supplies dispensed. Patient will report any increasing pain redness or drainage  The patient has been asked to follow-up with a new primary care provider in about 6 months  Marletta Lor

## 2018-05-17 NOTE — Patient Instructions (Addendum)
Laceration Care, Adult A laceration is a cut that goes through all of the layers of the skin and into the tissue that is right under the skin. Some lacerations heal on their own. Others need to be closed with stitches (sutures), staples, skin adhesive strips, or skin glue. Proper laceration care minimizes the risk of infection and helps the laceration to heal better. How is this treated? If sutures or staples were used:  Keep the wound clean and dry.  If you were given a bandage (dressing), you should change it at least one time per day or as told by your health care provider. You should also change it if it becomes wet or dirty.  Keep the wound completely dry for the first 24 hours or as told by your health care provider. After that time, you may shower or bathe. However, make sure that the wound is not soaked in water until after the sutures or staples have been removed.  Clean the wound one time each day or as told by your health care provider: ? Wash the wound with soap and water. ? Rinse the wound with water to remove all soap. ? Pat the wound dry with a clean towel. Do not rub the wound.  After cleaning the wound, apply a thin layer of antibiotic ointmentas told by your health care provider. This will help to prevent infection and keep the dressing from sticking to the wound.  Have the sutures or staples removed as told by your health care provider. If skin adhesive strips were used:  Keep the wound clean and dry.  If you were given a bandage (dressing), you should change it at least one time per day or as told by your health care provider. You should also change it if it becomes dirty or wet.  Do not get the skin adhesive strips wet. You may shower or bathe, but be careful to keep the wound dry.  If the wound gets wet, pat it dry with a clean towel. Do not rub the wound.  Skin adhesive strips fall off on their own. You may trim the strips as the wound heals. Do not remove skin  adhesive strips that are still stuck to the wound. They will fall off in time. If skin glue was used:  Try to keep the wound dry, but you may briefly wet it in the shower or bath. Do not soak the wound in water, such as by swimming.  After you have showered or bathed, gently pat the wound dry with a clean towel. Do not rub the wound.  Do not do any activities that will make you sweat heavily until the skin glue has fallen off on its own.  Do not apply liquid, cream, or ointment medicine to the wound while the skin glue is in place. Using those may loosen the film before the wound has healed.  If you were given a bandage (dressing), you should change it at least one time per day or as told by your health care provider. You should also change it if it becomes dirty or wet.  If a dressing is placed over the wound, be careful not to apply tape directly over the skin glue. Doing that may cause the glue to be pulled off before the wound has healed.  Do not pick at the glue. The skin glue usually remains in place for 5-10 days, then it falls off of the skin. General Instructions  Take over-the-counter and prescription medicines only   as told by your health care provider.  If you were prescribed an antibiotic medicine or ointment, take or apply it as told by your doctor. Do not stop using it even if your condition improves.  To help prevent scarring, make sure to cover your wound with sunscreen whenever you are outside after stitches are removed, after adhesive strips are removed, or when glue remains in place and the wound is healed. Make sure to wear a sunscreen of at least 30 SPF.  Do not scratch or pick at the wound.  Keep all follow-up visits as told by your health care provider. This is important.  Check your wound every day for signs of infection. Watch for: ? Redness, swelling, or pain. ? Fluid, blood, or pus.  Raise (elevate) the injured area above the level of your heart while you  are sitting or lying down, if possible. Contact a health care provider if:  You received a tetanus shot and you have swelling, severe pain, redness, or bleeding at the injection site.  You have a fever.  A wound that was closed breaks open.  You notice a bad smell coming from your wound or your dressing.  You notice something coming out of the wound, such as wood or glass.  Your pain is not controlled with medicine.  You have increased redness, swelling, or pain at the site of your wound.  You have fluid, blood, or pus coming from your wound.  You notice a change in the color of your skin near your wound.  You need to change the dressing frequently due to fluid, blood, or pus draining from the wound.  You develop a new rash.  You develop numbness around the wound. Get help right away if:  You develop severe swelling around the wound.  Your pain suddenly increases and is severe.  You develop painful lumps near the wound or on skin that is anywhere on your body.  You have a red streak going away from your wound.  The wound is on your hand or foot and you cannot properly move a finger or toe.  The wound is on your hand or foot and you notice that your fingers or toes look pale or bluish. This information is not intended to replace advice given to you by your health care provider. Make sure you discuss any questions you have with your health care provider. Document Released: 10/27/2005 Document Revised: 03/28/2016 Document Reviewed: 10/23/2014 Elsevier Interactive Patient Education  [employer].   Follow-up in 6 months with a new provider

## 2018-05-24 DIAGNOSIS — H04123 Dry eye syndrome of bilateral lacrimal glands: Secondary | ICD-10-CM | POA: Diagnosis not present

## 2018-05-24 DIAGNOSIS — H02833 Dermatochalasis of right eye, unspecified eyelid: Secondary | ICD-10-CM | POA: Diagnosis not present

## 2018-05-24 DIAGNOSIS — H40023 Open angle with borderline findings, high risk, bilateral: Secondary | ICD-10-CM | POA: Diagnosis not present

## 2018-05-28 DIAGNOSIS — M25511 Pain in right shoulder: Secondary | ICD-10-CM | POA: Diagnosis not present

## 2018-08-03 ENCOUNTER — Ambulatory Visit (INDEPENDENT_AMBULATORY_CARE_PROVIDER_SITE_OTHER): Payer: Medicare Other | Admitting: Adult Health

## 2018-08-03 ENCOUNTER — Encounter: Payer: Self-pay | Admitting: Adult Health

## 2018-08-03 VITALS — BP 130/80 | HR 80 | Temp 98.0°F | Wt 174.8 lb

## 2018-08-03 DIAGNOSIS — F419 Anxiety disorder, unspecified: Secondary | ICD-10-CM

## 2018-08-03 DIAGNOSIS — Z7689 Persons encountering health services in other specified circumstances: Secondary | ICD-10-CM | POA: Diagnosis not present

## 2018-08-03 DIAGNOSIS — I10 Essential (primary) hypertension: Secondary | ICD-10-CM

## 2018-08-03 NOTE — Patient Instructions (Signed)
It was great meeting you today   Please follow up in January for your physical. If you need anything before that time, please let me know

## 2018-08-03 NOTE — Progress Notes (Signed)
Patient presents to clinic today to establish care. He is a pleasant 77 year old male who  has a past medical history of Arthritis, Bilateral shoulder pain, BPH (benign prostatic hyperplasia), GERD (gastroesophageal reflux disease), Renal calculus, and Vertigo.  He is a former patient of Dr. Raliegh Ip who is transferring care  His last CPE was in January 2019   He reports that he has been having right shoulder surgery in October.   Acute Concerns: Establish Care   Chronic Issues: Essential Hypertension - Not currently prescribed medication BP Readings from Last 3 Encounters:  08/03/18 130/80  05/17/18 140/82  02/15/18 130/70   Anxiety - Takes Ativan 1 mg PRN   Arthritis - Takes Celebrex PRN  ED- takes Cialis 10 mg PRN   BPH - not currently on   Health Maintenance: Dental -- Routine Care  Vision -- Routine Care  Immunizations -- UTD  Colonoscopy -- June 2017  Diet: Eats heart healthy  Exercise: Runs 6 miles a day    Past Medical History:  Diagnosis Date  . Arthritis   . Bilateral shoulder pain   . BPH (benign prostatic hyperplasia)   . GERD (gastroesophageal reflux disease)   . Renal calculus    x2  . Vertigo    dizziness    Past Surgical History:  Procedure Laterality Date  . achilles tendon     surgical repair bilat  . COLONOSCOPY      Current Outpatient Medications on File Prior to Visit  Medication Sig Dispense Refill  . aspirin 81 MG tablet Take 81 mg by mouth 2 (two) times daily.      . celecoxib (CELEBREX) 200 MG capsule Take 1 capsule (200 mg total) by mouth 2 (two) times daily. 60 capsule 4  . LORazepam (ATIVAN) 1 MG tablet TAKE 1 TABLET 3 TIMES A DAY AS NEEDED FOR ANXIETY 60 tablet 2  . Multiple Vitamin (MULTIVITAMIN) tablet Take 1 tablet by mouth daily.      . NON FORMULARY Zipcor 25 mg; q qid for arthritic pain     . Omega-3 Fatty Acids (OMEGA 3 PO) Take by mouth.    . tadalafil (CIALIS) 10 MG tablet Take 1 tablet (10 mg total) by mouth daily as  needed for erectile dysfunction. 10 tablet 6   Current Facility-Administered Medications on File Prior to Visit  Medication Dose Route Frequency Provider Last Rate Last Dose  . 0.9 %  sodium chloride infusion  500 mL Intravenous Continuous Ladene Artist, MD        Allergies  Allergen Reactions  . Plasticized Base [Plastibase] Hives and Rash    Family History  Problem Relation Age of Onset  . Heart disease Father   . Colon cancer Neg Hx   . Esophageal cancer Neg Hx   . Stomach cancer Neg Hx   . Rectal cancer Neg Hx     Social History   Socioeconomic History  . Marital status: Married    Spouse name: Not on file  . Number of children: Not on file  . Years of education: Not on file  . Highest education level: Not on file  Occupational History  . Not on file  Social Needs  . Financial resource strain: Not on file  . Food insecurity:    Worry: Not on file    Inability: Not on file  . Transportation needs:    Medical: Not on file    Non-medical: Not on file  Tobacco Use  .  Smoking status: Former Smoker    Types: Cigarettes    Last attempt to quit: 04/07/1968    Years since quitting: 50.3  . Smokeless tobacco: Never Used  Substance and Sexual Activity  . Alcohol use: Yes    Alcohol/week: 4.0 standard drinks    Types: 4 Glasses of wine per week    Comment: weekends  . Drug use: No  . Sexual activity: Yes  Lifestyle  . Physical activity:    Days per week: Not on file    Minutes per session: Not on file  . Stress: Not on file  Relationships  . Social connections:    Talks on phone: Not on file    Gets together: Not on file    Attends religious service: Not on file    Active member of club or organization: Not on file    Attends meetings of clubs or organizations: Not on file    Relationship status: Not on file  . Intimate partner violence:    Fear of current or ex partner: Not on file    Emotionally abused: Not on file    Physically abused: Not on file     Forced sexual activity: Not on file  Other Topics Concern  . Not on file  Social History Narrative  . Not on file    Review of Systems  Constitutional: Negative.   Respiratory: Negative.   Cardiovascular: Negative.   Gastrointestinal: Negative.   Genitourinary: Negative.   Musculoskeletal: Negative.   Neurological: Negative.   Psychiatric/Behavioral: Negative.   All other systems reviewed and are negative.   BP 130/80 (BP Location: Left Arm, Patient Position: Sitting, Cuff Size: Normal)   Pulse 80   Temp 98 F (36.7 C) (Oral)   Wt 174 lb 12.8 oz (79.3 kg)   SpO2 98%   BMI 27.38 kg/m   Physical Exam  Constitutional: He is oriented to person, place, and time. He appears well-developed and well-nourished. No distress.  Cardiovascular: Normal rate, regular rhythm, normal heart sounds and intact distal pulses.  Pulmonary/Chest: Effort normal and breath sounds normal.  Neurological: He is alert and oriented to person, place, and time.  Skin: Skin is warm and dry. He is not diaphoretic.  Psychiatric: He has a normal mood and affect. His behavior is normal. Judgment and thought content normal.  Nursing note and vitals reviewed.  Assessment/Plan: 1. Encounter to establish care - Follow up in January for CPE  - Follow up sooner if needed  2. Essential hypertension - No need for medication at this time   3. Anxiety - Ok with Ativan as needed  Dorothyann Peng, NP

## 2018-08-09 ENCOUNTER — Encounter (HOSPITAL_COMMUNITY): Payer: Self-pay

## 2018-08-09 NOTE — Pre-Procedure Instructions (Signed)
Paul Schwartz  08/09/2018      [id number] - [place], [place] - [address]. AT [address] [place] [address] Phone: [phone] Fax: [phone]    Your procedure is scheduled on October 10th.  Report to [facility] Admitting at 5:30 A.M.  Call this number if you have problems the morning of surgery:  [phone]   Remember:  Do not eat or drink after midnight.    Take these medicines the morning of surgery with A SIP OF WATER    Lorazepam (Ativan)    7 days prior to surgery STOP taking any Aspirin (unless otherwise instructed by  your surgeon), Aleve, Naproxen, Ibuprofen, Motrin, Advil, Goody's, BC's, all herbal  medications, fish oil, and all vitamins   Follow your surgeon's instructions on when to stop Asprin.  If no instructions were  given by your surgeon then you will need to call the office to get those instructions.      Do not wear jewelry, make-up or nail polish.  Do not wear lotions, powders, or perfumes, or deodorant.  Do not shave 48 hours prior to surgery.  Men may shave face and neck.  Do not bring valuables to the hospital.  [facility] is not responsible for any belongings or valuables.  Contacts, dentures or bridgework may not be worn into surgery.  Leave your suitcase in the car.  After surgery it may be brought to your room.   [place]- Preparing For Surgery  Before surgery, you can play an important role. Because skin is not sterile, your skin needs to be as free of germs as possible. You can reduce the number of germs on your skin by washing with CHG (chlorahexidine gluconate) Soap before surgery.  CHG is an antiseptic cleaner which kills germs and bonds with the skin to continue killing germs even after washing.    Oral Hygiene is also important to reduce your risk of infection.  Remember - BRUSH YOUR TEETH THE MORNING OF SURGERY WITH YOUR REGULAR TOOTHPASTE  Please do  not use if you have an allergy to CHG or antibacterial soaps. If your skin becomes reddened/irritated stop using the CHG.  Do not shave (including legs and underarms) for at least 48 hours prior to first CHG shower. It is OK to shave your face.  Please follow these instructions carefully.   1. Shower the NIGHT BEFORE SURGERY and the MORNING OF SURGERY with CHG.   2. If you chose to wash your hair, wash your hair first as usual with your normal shampoo.  3. After you shampoo, rinse your hair and body thoroughly to remove the shampoo.  4. Use CHG as you would any other liquid soap. You can apply CHG directly to the skin and wash gently with a scrungie or a clean washcloth.   5. Apply the CHG Soap to your body ONLY FROM THE NECK DOWN.  Do not use on open wounds or open sores. Avoid contact with your eyes, ears, mouth and genitals (private parts). Wash Face and genitals (private parts)  with your normal soap.  6. Wash thoroughly, paying special attention to the area where your surgery will be performed.  7. Thoroughly rinse your body with warm water from the neck down.  8. DO NOT shower/wash with your normal soap after using and rinsing off the CHG Soap.  9. Pat yourself dry with a CLEAN TOWEL.  10. Wear CLEAN PAJAMAS to  bed the night before surgery, wear comfortable clothes the morning of surgery  11. Place CLEAN SHEETS on your bed the night of your first shower and DO NOT SLEEP WITH PETS.    Day of Surgery:  Do not apply any deodorants/lotions.  Please wear clean clothes to the hospital/surgery center.   Remember to brush your teeth WITH YOUR REGULAR TOOTHPASTE.    For patients admitted to the hospital, discharge time will be determined by your treatment team.   Please read over the following fact sheets that you were given. Coughing and Deep Breathing and Surgical Site Infection Prevention

## 2018-08-10 ENCOUNTER — Other Ambulatory Visit: Payer: Self-pay

## 2018-08-10 ENCOUNTER — Encounter (HOSPITAL_COMMUNITY): Payer: Self-pay

## 2018-08-10 ENCOUNTER — Encounter (HOSPITAL_COMMUNITY)
Admission: RE | Admit: 2018-08-10 | Discharge: 2018-08-10 | Disposition: A | Discharge status: Home / Self Care | Payer: Medicare Other | Source: Ambulatory Visit | Attending: Orthopedic Surgery | Admitting: Orthopedic Surgery

## 2018-08-10 DIAGNOSIS — Z01812 Encounter for preprocedural laboratory examination: Secondary | ICD-10-CM | POA: Diagnosis not present

## 2018-08-10 HISTORY — DX: Personal history of urinary calculi: Z87.442

## 2018-08-10 LAB — SURGICAL PCR SCREEN
MRSA, PCR: NEGATIVE
Staphylococcus aureus: NEGATIVE

## 2018-08-10 LAB — CBC
HCT: 41.2 % (ref 39.0–52.0)
Hemoglobin: 14.2 g/dL (ref 13.0–17.0)
MCH: 32.6 pg (ref 26.0–34.0)
MCHC: 34.5 g/dL (ref 30.0–36.0)
MCV: 94.5 fL (ref 78.0–100.0)
Platelets: 182 10*3/uL (ref 150–400)
RBC: 4.36 MIL/uL (ref 4.22–5.81)
RDW: 13 % (ref 11.5–15.5)
WBC: 5.7 10*3/uL (ref 4.0–10.5)

## 2018-08-10 NOTE — Progress Notes (Addendum)
PCP: Dorothyann Peng, NP  Cardiologist: pt denies  EKG: pt denies past year, no heart history  Stress test: pt denies  ECHO: pt denies  Cardiac Cath: pt denies  Chest x-ray: pt denies past year, no recent respiratory infections/complications  Pt advised to hold aspirin 81 mg 5 day prior to surgery, last dose 08/13/18

## 2018-08-12 ENCOUNTER — Ambulatory Visit (INDEPENDENT_AMBULATORY_CARE_PROVIDER_SITE_OTHER): Payer: Medicare Other | Admitting: *Deleted

## 2018-08-12 DIAGNOSIS — Z23 Encounter for immunization: Secondary | ICD-10-CM | POA: Diagnosis not present

## 2018-08-13 ENCOUNTER — Ambulatory Visit: Payer: Medicare Other

## 2018-08-17 ENCOUNTER — Telehealth: Payer: Self-pay | Admitting: Adult Health

## 2018-08-17 NOTE — Telephone Encounter (Signed)
Copied from [place] [id number]. Topic: Quick Communication - Rx Refill/Question >> Aug 17, 2018 10:48 AM Margot Ables wrote: Medication: LORazepam (ATIVAN) 1 MG tablet - pt hasn't used in a long time - it was prescribed by Dr. Raliegh Ip originally - pt is scheduled for surgery 10/10 and wanting for anxiety - please advise  Has the patient contacted their pharmacy? Yes - expired Preferred Pharmacy (with phone number or street name): [id number] [place], [place]. AT [phone] (Phone) [phone] (Fax)  Last OV 08/03/18 Last refill 07/30/16 #60 refill 2

## 2018-08-18 ENCOUNTER — Encounter (HOSPITAL_COMMUNITY): Payer: Self-pay | Admitting: Certified Registered Nurse Anesthetist

## 2018-08-18 ENCOUNTER — Other Ambulatory Visit: Payer: Self-pay | Admitting: Adult Health

## 2018-08-18 MED ORDER — TRANEXAMIC ACID-NACL 1000-0.7 MG/100ML-% IV SOLN
1000.0000 mg | INTRAVENOUS | Status: AC
  Administered 2018-08-19: 1000 mg via INTRAVENOUS
  Filled 2018-08-18 (×2): qty 100

## 2018-08-18 MED ORDER — LORAZEPAM 1 MG PO TABS: 1.0000 mg | ORAL_TABLET | Freq: Three times a day (TID) | ORAL | 0 refills | Status: DC | PRN

## 2018-08-18 NOTE — Telephone Encounter (Signed)
Per last OV 08/03/18 "Okay to refill Ativan as needed"  Pt requesting refill ASAP - having surgery 08/19/18  Please advise [facility]. Thanks .

## 2018-08-19 ENCOUNTER — Inpatient Hospital Stay (HOSPITAL_COMMUNITY): Payer: Medicare Other | Admitting: Certified Registered Nurse Anesthetist

## 2018-08-19 ENCOUNTER — Inpatient Hospital Stay (HOSPITAL_COMMUNITY)
Admission: RE | Admit: 2018-08-19 | Discharge: 2018-08-20 | DRG: 483 | Disposition: A | Discharge status: Home / Self Care | Payer: Medicare Other | Attending: Orthopedic Surgery | Admitting: Orthopedic Surgery

## 2018-08-19 ENCOUNTER — Encounter (HOSPITAL_COMMUNITY)
Admission: RE | Disposition: A | Discharge status: Home / Self Care | Payer: Self-pay | Source: Home / Self Care | Attending: Orthopedic Surgery

## 2018-08-19 ENCOUNTER — Encounter (HOSPITAL_COMMUNITY): Payer: Self-pay

## 2018-08-19 ENCOUNTER — Other Ambulatory Visit: Payer: Self-pay

## 2018-08-19 DIAGNOSIS — M75101 Unspecified rotator cuff tear or rupture of right shoulder, not specified as traumatic: Secondary | ICD-10-CM | POA: Diagnosis present

## 2018-08-19 DIAGNOSIS — Z96619 Presence of unspecified artificial shoulder joint: Secondary | ICD-10-CM

## 2018-08-19 DIAGNOSIS — N400 Enlarged prostate without lower urinary tract symptoms: Secondary | ICD-10-CM | POA: Diagnosis present

## 2018-08-19 DIAGNOSIS — Z87891 Personal history of nicotine dependence: Secondary | ICD-10-CM

## 2018-08-19 DIAGNOSIS — K219 Gastro-esophageal reflux disease without esophagitis: Secondary | ICD-10-CM | POA: Diagnosis present

## 2018-08-19 DIAGNOSIS — M19011 Primary osteoarthritis, right shoulder: Secondary | ICD-10-CM | POA: Diagnosis present

## 2018-08-19 HISTORY — PX: REVERSE SHOULDER ARTHROPLASTY: SHX5054

## 2018-08-19 SURGERY — ARTHROPLASTY, SHOULDER, TOTAL, REVERSE
Anesthesia: Regional | Site: Shoulder | Laterality: Right

## 2018-08-19 MED ORDER — PHENYLEPHRINE 40 MCG/ML (10ML) SYRINGE FOR IV PUSH (FOR BLOOD PRESSURE SUPPORT)
PREFILLED_SYRINGE | INTRAVENOUS | Status: AC
  Filled 2018-08-19: qty 10

## 2018-08-19 MED ORDER — LIDOCAINE 2% (20 MG/ML) 5 ML SYRINGE
INTRAMUSCULAR | Status: AC
  Filled 2018-08-19: qty 5

## 2018-08-19 MED ORDER — CEFAZOLIN SODIUM-DEXTROSE 2-4 GM/100ML-% IV SOLN
2.0000 g | INTRAVENOUS | Status: AC
  Administered 2018-08-19: 2 g via INTRAVENOUS
  Filled 2018-08-19: qty 100

## 2018-08-19 MED ORDER — METOCLOPRAMIDE HCL 5 MG PO TABS: 5.0000 mg | ORAL_TABLET | Freq: Three times a day (TID) | ORAL | Status: DC | PRN

## 2018-08-19 MED ORDER — METHOCARBAMOL 500 MG PO TABS: 500.0000 mg | ORAL_TABLET | Freq: Four times a day (QID) | ORAL | Status: DC | PRN

## 2018-08-19 MED ORDER — ONDANSETRON HCL 4 MG/2ML IJ SOLN: 4.0000 mg | Freq: Four times a day (QID) | INTRAMUSCULAR | Status: DC | PRN

## 2018-08-19 MED ORDER — MIDAZOLAM HCL 5 MG/5ML IJ SOLN
INTRAMUSCULAR | Status: DC | PRN
  Administered 2018-08-19: 1 mg via INTRAVENOUS

## 2018-08-19 MED ORDER — MAGNESIUM CITRATE PO SOLN: 1.0000 | Freq: Once | ORAL | Status: DC | PRN

## 2018-08-19 MED ORDER — PHENOL 1.4 % MT LIQD: 1.0000 | OROMUCOSAL | Status: DC | PRN

## 2018-08-19 MED ORDER — DEXAMETHASONE SODIUM PHOSPHATE 10 MG/ML IJ SOLN
INTRAMUSCULAR | Status: DC | PRN
  Administered 2018-08-19: 10 mg via INTRAVENOUS

## 2018-08-19 MED ORDER — OXYCODONE HCL 5 MG PO TABS: 5.0000 mg | ORAL_TABLET | ORAL | Status: DC | PRN

## 2018-08-19 MED ORDER — ROCURONIUM BROMIDE 50 MG/5ML IV SOSY
PREFILLED_SYRINGE | INTRAVENOUS | Status: AC
  Filled 2018-08-19: qty 5

## 2018-08-19 MED ORDER — FENTANYL CITRATE (PF) 100 MCG/2ML IJ SOLN
INTRAMUSCULAR | Status: DC | PRN
  Administered 2018-08-19 (×2): 50 ug via INTRAVENOUS

## 2018-08-19 MED ORDER — MENTHOL 3 MG MT LOZG: 1.0000 | LOZENGE | OROMUCOSAL | Status: DC | PRN

## 2018-08-19 MED ORDER — DEXAMETHASONE SODIUM PHOSPHATE 10 MG/ML IJ SOLN
INTRAMUSCULAR | Status: AC
  Filled 2018-08-19: qty 1

## 2018-08-19 MED ORDER — POLYETHYLENE GLYCOL 3350 17 G PO PACK: 17.0000 g | PACK | Freq: Every day | ORAL | Status: DC | PRN

## 2018-08-19 MED ORDER — SODIUM CHLORIDE 0.9 % IV SOLN
INTRAVENOUS | Status: DC | PRN
  Administered 2018-08-19: 25 ug/min via INTRAVENOUS

## 2018-08-19 MED ORDER — ONDANSETRON HCL 4 MG PO TABS: 4.0000 mg | ORAL_TABLET | Freq: Four times a day (QID) | ORAL | Status: DC | PRN

## 2018-08-19 MED ORDER — MIDAZOLAM HCL 2 MG/2ML IJ SOLN
INTRAMUSCULAR | Status: AC
  Filled 2018-08-19: qty 2

## 2018-08-19 MED ORDER — ACETAMINOPHEN 325 MG PO TABS: 325.0000 mg | ORAL_TABLET | Freq: Four times a day (QID) | ORAL | Status: DC | PRN

## 2018-08-19 MED ORDER — BUPIVACAINE LIPOSOME 1.3 % IJ SUSP
INTRAMUSCULAR | Status: DC | PRN
  Administered 2018-08-19: 10 mL via PERINEURAL

## 2018-08-19 MED ORDER — METHOCARBAMOL 1000 MG/10ML IJ SOLN: 500.0000 mg | Freq: Four times a day (QID) | INTRAVENOUS | Status: DC | PRN

## 2018-08-19 MED ORDER — ASPIRIN EC 81 MG PO TBEC
81.0000 mg | DELAYED_RELEASE_TABLET | Freq: Every day | ORAL | Status: DC
  Administered 2018-08-19 – 2018-08-20 (×2): 81 mg via ORAL
  Filled 2018-08-19 (×2): qty 1

## 2018-08-19 MED ORDER — PROPOFOL 10 MG/ML IV BOLUS
INTRAVENOUS | Status: DC | PRN
  Administered 2018-08-19: 160 mg via INTRAVENOUS

## 2018-08-19 MED ORDER — FENTANYL CITRATE (PF) 250 MCG/5ML IJ SOLN
INTRAMUSCULAR | Status: AC
  Filled 2018-08-19: qty 5

## 2018-08-19 MED ORDER — FENTANYL CITRATE (PF) 100 MCG/2ML IJ SOLN: 25.0000 ug | INTRAMUSCULAR | Status: DC | PRN

## 2018-08-19 MED ORDER — ALUM & MAG HYDROXIDE-SIMETH 200-200-20 MG/5ML PO SUSP: 30.0000 mL | ORAL | Status: DC | PRN

## 2018-08-19 MED ORDER — BUPIVACAINE HCL (PF) 0.5 % IJ SOLN
INTRAMUSCULAR | Status: DC | PRN
  Administered 2018-08-19: 15 mL via PERINEURAL

## 2018-08-19 MED ORDER — ONDANSETRON HCL 4 MG/2ML IJ SOLN: 4.0000 mg | Freq: Once | INTRAMUSCULAR | Status: DC | PRN

## 2018-08-19 MED ORDER — DOCUSATE SODIUM 100 MG PO CAPS
100.0000 mg | ORAL_CAPSULE | Freq: Two times a day (BID) | ORAL | Status: DC
  Administered 2018-08-19 – 2018-08-20 (×3): 100 mg via ORAL
  Filled 2018-08-19 (×3): qty 1

## 2018-08-19 MED ORDER — ONDANSETRON HCL 4 MG/2ML IJ SOLN
INTRAMUSCULAR | Status: AC
  Filled 2018-08-19: qty 2

## 2018-08-19 MED ORDER — LIDOCAINE 2% (20 MG/ML) 5 ML SYRINGE
INTRAMUSCULAR | Status: DC | PRN
  Administered 2018-08-19: 40 mg via INTRAVENOUS

## 2018-08-19 MED ORDER — PHENYLEPHRINE 40 MCG/ML (10ML) SYRINGE FOR IV PUSH (FOR BLOOD PRESSURE SUPPORT)
PREFILLED_SYRINGE | INTRAVENOUS | Status: DC | PRN
  Administered 2018-08-19 (×2): 80 ug via INTRAVENOUS

## 2018-08-19 MED ORDER — METOCLOPRAMIDE HCL 5 MG/ML IJ SOLN: 5.0000 mg | Freq: Three times a day (TID) | INTRAMUSCULAR | Status: DC | PRN

## 2018-08-19 MED ORDER — PROPOFOL 10 MG/ML IV BOLUS
INTRAVENOUS | Status: AC
  Filled 2018-08-19: qty 40

## 2018-08-19 MED ORDER — HYDROMORPHONE HCL 1 MG/ML IJ SOLN: 0.5000 mg | INTRAMUSCULAR | Status: DC | PRN

## 2018-08-19 MED ORDER — ROCURONIUM BROMIDE 50 MG/5ML IV SOSY
PREFILLED_SYRINGE | INTRAVENOUS | Status: DC | PRN
  Administered 2018-08-19: 40 mg via INTRAVENOUS

## 2018-08-19 MED ORDER — LACTATED RINGERS IV SOLN
INTRAVENOUS | Status: DC | PRN
  Administered 2018-08-19: 07:00:00 via INTRAVENOUS

## 2018-08-19 MED ORDER — SUGAMMADEX SODIUM 200 MG/2ML IV SOLN
INTRAVENOUS | Status: DC | PRN
  Administered 2018-08-19: 150 mg via INTRAVENOUS

## 2018-08-19 MED ORDER — KETOROLAC TROMETHAMINE 15 MG/ML IJ SOLN
7.5000 mg | Freq: Four times a day (QID) | INTRAMUSCULAR | Status: AC
  Administered 2018-08-19 – 2018-08-20 (×4): 7.5 mg via INTRAVENOUS
  Filled 2018-08-19 (×4): qty 1

## 2018-08-19 MED ORDER — LACTATED RINGERS IV SOLN
INTRAVENOUS | Status: DC
  Administered 2018-08-19: 13:00:00 via INTRAVENOUS

## 2018-08-19 MED ORDER — LORAZEPAM 1 MG PO TABS: 1.0000 mg | ORAL_TABLET | Freq: Three times a day (TID) | ORAL | Status: DC | PRN

## 2018-08-19 MED ORDER — DIPHENHYDRAMINE HCL 12.5 MG/5ML PO ELIX: 12.5000 mg | ORAL_SOLUTION | ORAL | Status: DC | PRN

## 2018-08-19 MED ORDER — CHLORHEXIDINE GLUCONATE 4 % EX LIQD: 60.0000 mL | Freq: Once | CUTANEOUS | Status: DC

## 2018-08-19 MED ORDER — BISACODYL 5 MG PO TBEC: 5.0000 mg | DELAYED_RELEASE_TABLET | Freq: Every day | ORAL | Status: DC | PRN

## 2018-08-19 MED ORDER — ONDANSETRON HCL 4 MG/2ML IJ SOLN
INTRAMUSCULAR | Status: DC | PRN
  Administered 2018-08-19: 4 mg via INTRAVENOUS

## 2018-08-19 MED ORDER — 0.9 % SODIUM CHLORIDE (POUR BTL) OPTIME
TOPICAL | Status: DC | PRN
  Administered 2018-08-19: 1000 mL

## 2018-08-19 SURGICAL SUPPLY — 59 items
BASEPLATE GLENOID SHLDR SM (Shoulder) ×2 IMPLANT
BLADE SAW SGTL 83.5X18.5 (BLADE) ×2 IMPLANT
COVER SURGICAL LIGHT HANDLE (MISCELLANEOUS) ×2 IMPLANT
COVER WAND RF STERILE (DRAPES) ×2 IMPLANT
CUP SUT UNIV REVERS 39 NEU (Shoulder) ×2 IMPLANT
DERMABOND ADVANCED (GAUZE/BANDAGES/DRESSINGS) ×1
DERMABOND ADVANCED .7 DNX12 (GAUZE/BANDAGES/DRESSINGS) ×1 IMPLANT
DRAPE ORTHO SPLIT 77X108 STRL (DRAPES) ×2
DRAPE SURG 17X11 SM STRL (DRAPES) ×2 IMPLANT
DRAPE SURG ORHT 6 SPLT 77X108 (DRAPES) ×2 IMPLANT
DRAPE U-SHAPE 47X51 STRL (DRAPES) ×2 IMPLANT
DRESSING AQUACEL AG SP 3.5X10 (GAUZE/BANDAGES/DRESSINGS) ×1 IMPLANT
DRSG AQUACEL AG ADV 3.5X10 (GAUZE/BANDAGES/DRESSINGS) ×2 IMPLANT
DRSG AQUACEL AG SP 3.5X10 (GAUZE/BANDAGES/DRESSINGS) ×2
DURAPREP 26ML APPLICATOR (WOUND CARE) ×2 IMPLANT
ELECT BLADE 4.0 EZ CLEAN MEGAD (MISCELLANEOUS) ×2
ELECT CAUTERY BLADE 6.4 (BLADE) ×2 IMPLANT
ELECT REM PT RETURN 9FT ADLT (ELECTROSURGICAL) ×2
ELECTRODE BLDE 4.0 EZ CLN MEGD (MISCELLANEOUS) ×1 IMPLANT
ELECTRODE REM PT RTRN 9FT ADLT (ELECTROSURGICAL) ×1 IMPLANT
FACESHIELD WRAPAROUND (MASK) ×6 IMPLANT
GLENOSPHERE LAT 39+4 SHOULDER (Shoulder) ×2 IMPLANT
GLOVE BIO SURGEON STRL SZ7.5 (GLOVE) ×2 IMPLANT
GLOVE BIO SURGEON STRL SZ8 (GLOVE) ×2 IMPLANT
GLOVE EUDERMIC 7 POWDERFREE (GLOVE) ×2 IMPLANT
GLOVE SS BIOGEL STRL SZ 7.5 (GLOVE) ×1 IMPLANT
GLOVE SUPERSENSE BIOGEL SZ 7.5 (GLOVE) ×1
GOWN STRL REUS W/ TWL LRG LVL3 (GOWN DISPOSABLE) ×1 IMPLANT
GOWN STRL REUS W/ TWL XL LVL3 (GOWN DISPOSABLE) ×2 IMPLANT
GOWN STRL REUS W/TWL LRG LVL3 (GOWN DISPOSABLE) ×1
GOWN STRL REUS W/TWL XL LVL3 (GOWN DISPOSABLE) ×2
INSERT HUMERAL MED 39/ +3 (Shoulder) ×1 IMPLANT
INSERT MEDIUM HUMERAL 39/ +3 (Shoulder) ×1 IMPLANT
KIT BASIN OR (CUSTOM PROCEDURE TRAY) ×2 IMPLANT
KIT TURNOVER KIT B (KITS) ×2 IMPLANT
MANIFOLD NEPTUNE II (INSTRUMENTS) ×2 IMPLANT
NEEDLE TAPERED W/ NITINOL LOOP (MISCELLANEOUS) ×2 IMPLANT
NS IRRIG 1000ML POUR BTL (IV SOLUTION) ×2 IMPLANT
PACK SHOULDER (CUSTOM PROCEDURE TRAY) ×2 IMPLANT
PAD ARMBOARD 7.5X6 YLW CONV (MISCELLANEOUS) ×4 IMPLANT
RESTRAINT HEAD UNIVERSAL NS (MISCELLANEOUS) ×2 IMPLANT
SCREW CENTRAL NONLOCK 25MM (Screw) ×2 IMPLANT
SCREW LOCK GLENOID UNI PERI (Screw) ×2 IMPLANT
SCREW LOCK PERIPHERAL 36MM (Screw) ×2 IMPLANT
SET PIN UNIVERSAL REVERSE (SET/KITS/TRAYS/PACK) ×2 IMPLANT
SLING ARM FOAM STRAP LRG (SOFTGOODS) IMPLANT
SLING ARM IMMOBILIZER LRG (SOFTGOODS) ×2 IMPLANT
SPACER SHLD UNI REV 39 +9 (Spacer) ×2 IMPLANT
SPONGE LAP 18X18 X RAY DECT (DISPOSABLE) ×2 IMPLANT
SPONGE LAP 4X18 RFD (DISPOSABLE) ×2 IMPLANT
STEM HUMERAL UNI REVERSE SZ10 (Stem) ×2 IMPLANT
SUT MNCRL AB 3-0 PS2 18 (SUTURE) ×2 IMPLANT
SUT MON AB 2-0 CT1 27 (SUTURE) ×2 IMPLANT
SUT VIC AB 1 CT1 27 (SUTURE) ×1
SUT VIC AB 1 CT1 27XBRD ANBCTR (SUTURE) ×1 IMPLANT
SUTURE TAPE 1.3 40 TPR END (SUTURE) ×1 IMPLANT
SUTURETAPE 1.3 40 TPR END (SUTURE) ×2
TOWEL OR 17X26 10 PK STRL BLUE (TOWEL DISPOSABLE) ×2 IMPLANT
WATER STERILE IRR 1000ML POUR (IV SOLUTION) ×2 IMPLANT

## 2018-08-19 NOTE — Anesthesia Postprocedure Evaluation (Signed)
Anesthesia Post Note  Patient: Paul Schwartz  Procedure(s) Performed: REVERSE RIGHT SHOULDER ARTHROPLASTY (Right Shoulder)     Patient location during evaluation: PACU Anesthesia Type: Regional and General Level of consciousness: awake and alert Pain management: pain level controlled Vital Signs Assessment: post-procedure vital signs reviewed and stable Respiratory status: spontaneous breathing, nonlabored ventilation, respiratory function stable and patient connected to nasal cannula oxygen Cardiovascular status: blood pressure returned to baseline and stable Postop Assessment: no apparent nausea or vomiting Anesthetic complications: no    Last Vitals:  Vitals:   08/19/18 1038 08/19/18 1742  BP: 119/73 103/69  Pulse: 76 99  Resp: 16 17  Temp: 36.6 C   SpO2: 95% 95%    Last Pain:  Vitals:   08/19/18 2000  TempSrc:   PainSc: 0-No pain                 Janzen Sacks P Bricelyn Freestone

## 2018-08-19 NOTE — H&P (Signed)
Paul Schwartz    Chief Complaint: right shoulder rotator cuff tear arthropathy HPI: The patient is a 77 y.o. 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 Arthritis   . Bilateral shoulder pain   . BPH (benign prostatic hyperplasia)   . GERD (gastroesophageal reflux disease)   . History of kidney stones   . Vertigo    dizziness    Past Surgical History:  Procedure Laterality Date  . achilles tendon     surgical repair bilat  . CATARACT EXTRACTION, BILATERAL    . COLONOSCOPY      Family History  Problem Relation Age of Onset  . Heart disease Father   . Colon cancer Neg Hx   . Esophageal cancer Neg Hx   . Stomach cancer Neg Hx   . Rectal cancer Neg Hx     Social History:  reports that he quit smoking about 50 years ago. His smoking use included cigarettes. He has never used smokeless tobacco. He reports that he drinks about 4.0 standard drinks of alcohol per week. He reports that he does not use drugs.   Facility-Administered Medications Prior to Admission  Medication Dose Route Frequency Provider Last Rate Last Dose  . 0.9 %  sodium chloride infusion  500 mL Intravenous Continuous Ladene Artist, MD       Medications Prior to Admission  Medication Sig Dispense Refill  . aspirin 81 MG tablet Take 81 mg by mouth daily.     . diclofenac sodium (VOLTAREN) 1 % GEL Apply 1 application topically 2 (two) times daily.    Marland Kitchen LORazepam (ATIVAN) 1 MG tablet Take 1 tablet (1 mg total) by mouth 3 (three) times daily as needed for anxiety. 30 tablet 0  . Multiple Vitamin (MULTIVITAMIN) tablet Take 1 tablet by mouth daily.      . Omega-3 Fatty Acids (OMEGA 3 PO) Take 1 capsule by mouth 2 (two) times daily.     . celecoxib (CELEBREX) 200 MG capsule Take 1 capsule (200 mg total) by mouth 2 (two) times daily. (Patient not taking: Reported on 08/03/2018) 60 capsule 4     Physical Exam: right shoulder with painful and  restricted motion as noted at recent office visits   Vitals  Temp:  [97.9 F (36.6 C)] 97.9 F (36.6 C) (10/10 0616) Pulse Rate:  [78] 78 (10/10 0616) Resp:  [20] 20 (10/10 0616) BP: (138)/(85) 138/85 (10/10 0616) SpO2:  [97 %] 97 % (10/10 0616) Weight:  [80.2 kg] 80.2 kg (10/10 0610)  Assessment/Plan  Impression: right shoulder rotator cuff tear arthropathy  Plan of Action: Procedure(s): REVERSE RIGHT SHOULDER ARTHROPLASTY  Amyra Vantuyl M Erskine Steinfeldt 08/19/2018, 6:26 AM Contact # [phone]

## 2018-08-19 NOTE — Transfer of Care (Signed)
Immediate Anesthesia Transfer of Care Note  Patient: Paul Schwartz  Procedure(s) Performed: REVERSE RIGHT SHOULDER ARTHROPLASTY (Right Shoulder)  Patient Location: PACU  Anesthesia Type:GA combined with regional for post-op pain  Level of Consciousness: awake, alert  and oriented  Airway & Oxygen Therapy: Patient Spontanous Breathing and Patient connected to nasal cannula oxygen  Post-op Assessment: Report given to RN and Post -op Vital signs reviewed and stable  Post vital signs: Reviewed and stable  Last Vitals:  Vitals Value Taken Time  BP 115/58 08/19/2018  9:38 AM  Temp    Pulse 80 08/19/2018  9:40 AM  Resp 16 08/19/2018  9:40 AM  SpO2 100 % 08/19/2018  9:40 AM  Vitals shown include unvalidated device data.  Last Pain:  Vitals:   08/19/18 0938  TempSrc:   PainSc: (P) 0-No pain      Patients Stated Pain Goal: 3 (97/98/92 1194)  Complications: No apparent anesthesia complications

## 2018-08-19 NOTE — Anesthesia Procedure Notes (Signed)
Procedure Name: Intubation Date/Time: 08/19/2018 7:38 AM Performed by: Candis Shine, CRNA Pre-anesthesia Checklist: Patient identified, Emergency Drugs available, Suction available and Patient being monitored Patient Re-evaluated:Patient Re-evaluated prior to induction Oxygen Delivery Method: Circle System Utilized Preoxygenation: Pre-oxygenation with 100% oxygen Induction Type: IV induction Ventilation: Mask ventilation without difficulty Laryngoscope Size: Mac and 4 Grade View: Grade I Tube type: Oral Tube size: 7.5 mm Number of attempts: 1 Airway Equipment and Method: Stylet Placement Confirmation: ETT inserted through vocal cords under direct vision,  positive ETCO2 and breath sounds checked- equal and bilateral Secured at: 23 cm Tube secured with: Tape Dental Injury: Teeth and Oropharynx as per pre-operative assessment

## 2018-08-19 NOTE — Anesthesia Preprocedure Evaluation (Addendum)
Anesthesia Evaluation  Patient identified by MRN, date of birth, ID band Patient awake    Reviewed: Allergy & Precautions, NPO status , Patient's Chart, lab work & pertinent test results  Airway Mallampati: II  TM Distance: >3 FB Neck ROM: Full    Dental no notable dental hx.    Pulmonary former smoker,    Pulmonary exam normal breath sounds clear to auscultation       Cardiovascular Exercise Tolerance: Good Normal cardiovascular exam Rhythm:Regular Rate:Normal     Neuro/Psych Vertigo negative psych ROS   GI/Hepatic Neg liver ROS,   Endo/Other  negative endocrine ROS  Renal/GU negative Renal ROS     Musculoskeletal  (+) Arthritis ,   Abdominal   Peds  Hematology negative hematology ROS (+)   Anesthesia Other Findings right shoulder rotator cuff tear arthropathy  Reproductive/Obstetrics                            Anesthesia Physical Anesthesia Plan  ASA: II  Anesthesia Plan: General and Regional   Post-op Pain Management: GA combined w/ Regional for post-op pain   Induction: Intravenous  PONV Risk Score and Plan: 2 and Ondansetron, Dexamethasone, Midazolam and Treatment may vary due to age or medical condition  Airway Management Planned: Oral ETT  Additional Equipment:   Intra-op Plan:   Post-operative Plan: Extubation in OR  Informed Consent: I have reviewed the patients History and Physical, chart, labs and discussed the procedure including the risks, benefits and alternatives for the proposed anesthesia with the patient or authorized representative who has indicated his/her understanding and acceptance.   Dental advisory given  Plan Discussed with: CRNA  Anesthesia Plan Comments:         Anesthesia Quick Evaluation

## 2018-08-19 NOTE — Discharge Instructions (Signed)
° °Kevin M. Supple, M.D., F.A.A.O.S. °Orthopaedic Surgery °Specializing in Arthroscopic and Reconstructive °Surgery of the Shoulder and Knee °[phone] °[address] - [place], [place] [address] - Fax [phone] ° ° °POST-OP TOTAL SHOULDER REPLACEMENT INSTRUCTIONS ° °1. Call the office at [phone] to schedule your first post-op appointment 10-14 days from the date of your surgery. ° °2. The bandage over your incision is waterproof. You may begin showering with this dressing on. You may leave this dressing on until first follow up appointment within 2 weeks. We prefer you leave this dressing in place until follow up however after 5-7 days if you are having itching or skin irritation and would like to remove it you may do so. Go slow and tug at the borders gently to break the bond the dressing has with the skin. At this point if there is no drainage it is okay to go without a bandage or you may cover it with a light guaze and tape. You can also expect significant bruising around your shoulder that will drift down your arm and into your chest wall. This is very normal and should resolve over several days. ° ° 3. Wear your sling/immobilizer at all times except to perform the exercises below or to occasionally let your arm dangle by your side to stretch your elbow. You also need to sleep in your sling immobilizer until instructed otherwise. ° °4. Range of motion to your elbow, wrist, and hand are encouraged 3-5 times daily. Exercise to your hand and fingers helps to reduce swelling you may experience. ° °5. Utilize ice to the shoulder 3-5 times minimum a day and additionally if you are experiencing pain. ° °6. Prescriptions for a pain medication and a muscle relaxant are provided for you. It is recommended that if you are experiencing pain that you pain medication alone is not controlling, add the muscle relaxant along with the pain medication which can give additional pain relief. The first 1-2 days  is generally the most severe of your pain and then should gradually decrease. As your pain lessens it is recommended that you decrease your use of the pain medications to an "as needed basis'" only and to always comply with the recommended dosages of the pain medications. ° °7. Pain medications can produce constipation along with their use. If you experience this, the use of an over the counter stool softener or laxative daily is recommended.  ° °8. For additional questions or concerns, please do not hesitate to call the office. If after hours there is an answering service to forward your concerns to the physician on call. ° °9.Pain control following an exparel block ° °To help control your post-operative pain you received a nerve block  performed with Exparel which is a long acting anesthetic (numbing agent) which can provide pain relief and sensations of numbness (and relief of pain) in the operative shoulder and arm for up to 3 days. Sometimes it provides mixed relief, meaning you may still have numbness in certain areas of the arm but can still be able to move  parts of that arm, hand, and fingers. We recommend that your prescribed pain medications  be used as needed. We do not feel it is necessary to "pre medicate" and "stay ahead" of pain.  Taking narcotic pain medications when you are not having any pain can lead to unnecessary and potentially dangerous side effects.  ° °POST-OP EXERCISES ° °Pendulum Exercises ° °Perform pendulum exercises while standing and bending at   the waist. Support your uninvolved arm on a table or chair and allow your operated arm to hang freely. Make sure to do these exercises passively - not using you shoulder muscles. ° °Repeat 20 times. Do 3 sessions per day. ° ° ° ° °

## 2018-08-19 NOTE — Anesthesia Procedure Notes (Signed)
Anesthesia Regional Block: Interscalene brachial plexus block   Pre-Anesthetic Checklist: ,, timeout performed, Correct Patient, Correct Site, Correct Laterality, Correct Procedure, Correct Position, site marked, Risks and benefits discussed,  Surgical consent,  Pre-op evaluation,  At surgeon's request and post-op pain management  Laterality: Right  Prep: chloraprep       Needles:  Injection technique: Single-shot  Needle Type: Echogenic Stimulator Needle     Needle Length: 9cm  Needle Gauge: 21     Additional Needles:   Procedures:,,,, ultrasound used (permanent image in chart),,,,  Narrative:  Start time: 08/19/2018 7:10 AM End time: 08/19/2018 7:20 AM Injection made incrementally with aspirations every 5 mL.  Performed by: Personally  Anesthesiologist: Murvin Natal, MD  Additional Notes: Functioning IV was confirmed and monitors were applied.  A timeout was performed. Sterile prep, hand hygiene and sterile gloves were used. A 70mm 21ga Arrow echogenic stimulator needle was used. Negative aspiration and negative test dose prior to incremental administration of local anesthetic. The patient tolerated the procedure well.  Ultrasound guidance: relevent anatomy identified, needle position confirmed, local anesthetic spread visualized around nerve(s), vascular puncture avoided.  Image printed for medical record.

## 2018-08-19 NOTE — Op Note (Signed)
08/19/2018  9:21 AM  PATIENT:   Paul Schwartz  77 y.o. male  PRE-OPERATIVE DIAGNOSIS:  right shoulder rotator cuff tear arthropathy  POST-OPERATIVE DIAGNOSIS:  same  PROCEDURE:  R RSA #10 stem, +9 spacer, +3 poly, 39/+4 glenosphere, small baseplate  SURGEON:  Phillippe Orlick, Metta Clines M.D.  ASSISTANTS: Shuford pac   ANESTHESIA:   GET + ISB  EBL: 150  SPECIMEN:  none  Drains: none   PATIENT DISPOSITION:  PACU - hemodynamically stable.    PLAN OF CARE: Admit for overnight observation  Brief history:  Patient is a 77 year old gentleman who has had chronic and progressively increasing right shoulder pain related to end stage osteoarthritis with associated large retracted chronic rotator cuff tear and associated rotator cuff tear arthropathy.  Due to his increasing pain and functional mentation she is brought to the operating this time for planned right shoulder reverse arthroplasty  Preoperatively I had counseled Dr. Dorrene German regarding treatment options as well as the potential risks versus benefits thereof.  Possible surgical complications were reviewed including potential for bleeding, infection, neurovascular injury, persistent pain, loss of motion, failure the implant, anesthetic complication, and possible need for additional surgery.  He understands and accepts and agrees with the planned procedure.  Procedure detail:  After undergoing routine preop evaluation patient received prophylactic antibiotics and interscalene block with Exparel was established in the holding area by the anesthesia department.  Placed supine on the operative suite induction of a general endotracheal anesthesia.  Placed in the beachchair position and appropriate padding protected.  The right shoulder girdle region was sterilely prepped and draped in standard fashion.  Timeout was called.  An anterior deltopectoral approach to the right shoulder was made through 10 cm incision.  Skin flaps were elevated  dissection was carried deeply and the cephalic vein taken laterally with the deltoid and the interval was then developed from proximal to distal.  Upper centimeter the pectoralis major was tenotomized to enhance exposure.  Conjoined tendon mobilized retracted medially.  Subscapularis was then divided away from the lesser tuberosity and tagged to the  2.  Suture tape sutures in the long head biceps tendon was then tenodesed at the upper border of the pectoralis major tendon and tenotomized and the proximal portion was unroofed and excised.  Then divided the capsular attachments from the anterior inferior and inferior aspects of the humeral neck allowing the humeral head to be delivered to the wound.  Using the extra medullary guide we outlined the proposed humeral head resection which we then completed with an oscillating saw to remove peripheral osteophytes and placed a metal cap over the cut proximal humeral surface.  This point then gained exposure of the glenoid with combination of Fukuda, pitchfork, and stick, retractors.  I performed a circumferential labral resection gaining complete visualization the periphery of the glenoid.  A guidepin was then placed into the center of the glenoid with a approximate 5 degree inferior tilt and this allowed [place] to use our central and peripheral reamers to prepare the glenoid obtaining excellent bony bed for placement of our small baseplate.  The baseplate was then impacted our central lag screw was placed followed by the inferior and superior locking screws all of which obtained excellent bony purchase and fixation.  The glenoid preparation was then completed with a peripheral reamers a 39/+4 glenosphere was then impacted onto the baseplate with stability confirmed.  This point we then returned to attention back to the proximal humerus where the canal was prepared  with hand reaming and then broached up to a size 10.  The central reamer was then placed preparing the  metaphysis.  A trial implant was then positioned and we confirmed that the joint could be reduced with good soft tissue balance.  Trial was removed on the back table we then assembled the final implant which was a size 10 stem.  Stem was impacted with excellent interference fit and fixation.  We then performed a series of trial reductions and felt that ultimately +9 spacer +3 poly-gave [place] appropriate soft tissue balance.  The final implants were then added to the humeral stem poly-was impacted final reduction was then performed.  We are very pleased with the overall stability of the construct.  Wound was irrigated hemostasis was obtained.  The subscapularis was then repaired back to the eyelets on the collar of our stem and this allowed the easily external rotation 45 degrees without excessive tension on the subscap repair.  The wound again was irrigated the deltopectoral interval was then closed with a series of figure-of-eight number Vicryl suture.  Vicryl used with subcu layer intracuticular 3 Monocryl the skin followed by Dermabond and Aquasol dressing left bladder and placed in a sling immobilizer and the patient was awakened expanded In stable condition  Trace sugar PA-C was used as an Environmental consultant throughout this case essential to help with positioning patient, position extremity, tissue ablation, suture management, wound closure, and intraoperative decision-making.  Marin Shutter MD   Contact # [phone]

## 2018-08-20 ENCOUNTER — Encounter (HOSPITAL_COMMUNITY): Payer: Self-pay | Admitting: Orthopedic Surgery

## 2018-08-20 MED ORDER — OXYCODONE-ACETAMINOPHEN 5-325 MG PO TABS: 1.0000 | ORAL_TABLET | ORAL | 0 refills | Status: DC | PRN

## 2018-08-20 MED ORDER — ONDANSETRON HCL 4 MG PO TABS: 4.0000 mg | ORAL_TABLET | Freq: Three times a day (TID) | ORAL | 0 refills | Status: DC | PRN

## 2018-08-20 NOTE — Progress Notes (Signed)
Discharge instructions completed with pt.  Pt verbalized understanding of the information.  Pt denies chest pain, shortness of breath, dizziness, lightheadedness, and n/v.  Pt's IV discontinued.  Pt waiting for ride home.

## 2018-08-20 NOTE — Discharge Summary (Signed)
PATIENT ID:      Paul Schwartz  MRN:     [id number] DOB/AGE:    78-Jul-1942 / 78 y.o.     DISCHARGE SUMMARY  ADMISSION DATE:    08/19/2018 DISCHARGE DATE:    ADMISSION DIAGNOSIS: right shoulder rotator cuff tear arthropathy Past Medical History:  Diagnosis Date  . Arthritis   . Bilateral shoulder pain   . BPH (benign prostatic hyperplasia)   . GERD (gastroesophageal reflux disease)   . History of kidney stones   . Vertigo    dizziness    DISCHARGE DIAGNOSIS:   Active Problems:   S/p reverse total shoulder arthroplasty   PROCEDURE: Procedure(s): REVERSE RIGHT SHOULDER ARTHROPLASTY on 08/19/2018  CONSULTS:    HISTORY:  See H&P in chart.  HOSPITAL COURSE:  Paul Schwartz is a 77 y.o. admitted on 08/19/2018 with a diagnosis of right shoulder rotator cuff tear arthropathy.  They were brought to the operating room on 08/19/2018 and underwent Procedure(s): REVERSE RIGHT SHOULDER ARTHROPLASTY.    They were given perioperative antibiotics:  Anti-infectives (From admission, onward)   Start     Dose/Rate Route Frequency Ordered Stop   08/19/18 0600  ceFAZolin (ANCEF) IVPB 2g/100 mL premix     2 g 200 mL/hr over 30 Minutes Intravenous On call to O.R. 08/19/18 5027 08/19/18 0740    .  Patient underwent the above named procedure and tolerated it well. The following day they were hemodynamically stable and pain was controlled and the block was still effective. They were neurovascularly intact to the operative extremity. OT was ordered and worked with patient per protocol. They were medically and orthopaedically stable for discharge on day 1.    DIAGNOSTIC STUDIES:  RECENT RADIOGRAPHIC STUDIES :  No results found.  RECENT VITAL SIGNS:   Patient Vitals for the past 24 hrs:  BP Temp Temp src Pulse Resp SpO2  08/20/18 0425 108/73 97.9 F (36.6 C) Oral 73 15 95 %  08/19/18 2347 96/63 98.1 F (36.7 C) Oral 86 15 94 %  08/19/18 2017 115/78 97.9 F (36.6 C) Oral 94 17 97 %   08/19/18 1742 103/69 - - 99 17 95 %  08/19/18 1038 119/73 97.8 F (36.6 C) Oral 76 16 95 %  08/19/18 1023 114/72 97.7 F (36.5 C) - 77 16 97 %  08/19/18 1008 116/73 - - 75 16 97 %  08/19/18 0953 110/69 - - 80 17 96 %  08/19/18 0938 (!) 115/58 97.7 F (36.5 C) - 80 16 100 %  .  RECENT EKG RESULTS:    Orders placed or performed in visit on 10/04/14  . EKG 12-Lead    DISCHARGE INSTRUCTIONS:    DISCHARGE MEDICATIONS:   Allergies as of 08/20/2018      Reactions   Plasticized Base [plastibase] Hives, Rash      Medication List    STOP taking these medications   celecoxib 200 MG capsule Commonly known as:  CELEBREX     TAKE these medications   aspirin 81 MG tablet Take 81 mg by mouth daily.   diclofenac sodium 1 % Gel Commonly known as:  VOLTAREN Apply 1 application topically 2 (two) times daily.   LORazepam 1 MG tablet Commonly known as:  ATIVAN Take 1 tablet (1 mg total) by mouth 3 (three) times daily as needed for anxiety.   multivitamin tablet Take 1 tablet by mouth daily.   OMEGA 3 PO Take 1 capsule by mouth 2 (two) times daily.  ondansetron 4 MG tablet Commonly known as:  ZOFRAN Take 1 tablet (4 mg total) by mouth every 8 (eight) hours as needed for nausea or vomiting.   oxyCODONE-acetaminophen 5-325 MG tablet Commonly known as:  PERCOCET/ROXICET Take 1 tablet by mouth every 4 (four) hours as needed (max 6 q).       FOLLOW UP VISIT:   Follow-up Information    Justice Britain, MD.   Specialty:  Orthopedic Surgery Why:  call to be seen in 10-14 days Contact information: [address] [place] [address] [phone]           DISCHARGE TO: Home   DISCHARGE CONDITION:  Paul Schwartz Paul Schwartz for Dr. Lennette Bihari Supple 08/20/2018, 8:01 AM

## 2018-08-20 NOTE — Evaluation (Signed)
Occupational Therapy Evaluation and Discharge Patient Details Name: Paul Schwartz MRN: [id number] DOB: 1941-06-24 Today's Date: 08/20/2018    History of Present Illness Pt is a 77 y/o male s/p R Reverse TSA. Pt has a PMH including Arthritis, Bilateral shoulder pain, BPH, GERD, Cataract extraction, bilateral, and Vertigo.   Clinical Impression   PTA Pt independent in ADL and mobility - Pt enjoys exercising at the [facility]. Pt educated on sling management, sleeping with sling, bathing and shower safety/techniques, dressing, and exercises as ordered by MD (see exercise section below). Pt able to verbalize understanding. Pt's block still partially in place at this time. Pt with no further questions or concerns, please continue rehab of shoulder as ordered by MD at follow up. Thank you for the opportunity to serve this patient.     Follow Up Recommendations  Follow surgeon's recommendation for DC plan and follow-up therapies    Equipment Recommendations  None recommended by OT    Recommendations for Other Services       Precautions / Restrictions Precautions Precautions: Shoulder Shoulder Interventions: Shoulder sling/immobilizer;At all times;Off for dressing/bathing/exercises Precaution Booklet Issued: Yes (comment) Precaution Comments: shoulder dc handout reviewed in full Required Braces or Orthoses: Sling Restrictions Weight Bearing Restrictions: Yes RUE Weight Bearing: Non weight bearing      Mobility Bed Mobility Overal bed mobility: Modified Independent             General bed mobility comments: increased time required, no attempt to push or pull with UE  Transfers Overall transfer level: Modified independent Equipment used: None                  Balance                                           ADL either performed or assessed with clinical judgement   ADL                                         General ADL  Comments: see shoulder section below     Vision Baseline Vision/History: No visual deficits Patient Visual Report: No change from baseline       Perception     Praxis      Pertinent Vitals/Pain Pain Assessment: No/denies pain(block still in place)     Hand Dominance Right   Extremity/Trunk Assessment     Lower Extremity Assessment Lower Extremity Assessment: Overall WFL for tasks assessed   Cervical / Trunk Assessment Cervical / Trunk Assessment: Normal   Communication Communication Communication: No difficulties   Cognition Arousal/Alertness: Awake/alert Behavior During Therapy: WFL for tasks assessed/performed Overall Cognitive Status: Within Functional Limits for tasks assessed                                     General Comments  OK to use operative arm for feeding, hygiene and ADLs.    Exercises Exercises: Shoulder Shoulder Exercises Pendulum Exercise: PROM;Right(gentle) Shoulder Flexion: Right;AROM;PROM;Self ROM;Other (comment)(to 60 degrees for ADL purposes) Shoulder Extension: AROM;Right(for ADL purposes) Shoulder ABduction: AROM;Right(to 45 degrees for ADL purposes) Shoulder External Rotation: AROM;Right(to 20 degrees for ADL purposes) Elbow Flexion: AROM;Self ROM;Right Elbow Extension: AROM;Self ROM;Right Wrist Flexion: AROM;Self ROM;Right  Wrist Extension: AROM;Self ROM;Right Digit Composite Flexion: AROM;Right Composite Extension: AROM;Right Neck Flexion: AROM Neck Extension: AROM Neck Lateral Flexion - Right: AROM Neck Lateral Flexion - Left: AROM   Shoulder Instructions Shoulder Instructions Donning/doffing shirt without moving shoulder: Patient able to independently direct caregiver;Moderate assistance Method for sponge bathing under operated UE: Supervision/safety Donning/doffing sling/immobilizer: Moderate assistance;Patient able to independently direct caregiver Correct positioning of sling/immobilizer: Independent Pendulum  exercises (written home exercise program): Supervision/safety ROM for elbow, wrist and digits of operated UE: Independent Sling wearing schedule (on at all times/off for ADL's): Independent Proper positioning of operated UE when showering: Supervision/safety Positioning of UE while sleeping: Minimal assistance    Home Living Family/patient expects to be discharged to:: Private residence Living Arrangements: Spouse/significant other Available Help at Discharge: Family;Available 24 hours/day Type of Home: House Home Access: Stairs to enter [employer] of Steps: 1 threshold Entrance Stairs-Rails: None Home Layout: One level     Bathroom Shower/Tub: Occupational psychologist: Standard     Home Equipment: None          Prior Functioning/Environment Level of Independence: Independent        Comments: very active at [facility]         OT Problem List: Decreased range of motion;Decreased activity tolerance;Impaired balance (sitting and/or standing);Decreased knowledge of precautions;Impaired sensation;Impaired UE functional use;Pain      OT Treatment/Interventions:      OT Goals(Current goals can be found in the care plan section) Acute Rehab OT Goals Patient Stated Goal: to get back to 100% with RUE OT Goal Formulation: With patient Time For Goal Achievement: 09/03/18 Potential to Achieve Goals: Good  OT Frequency:     Barriers to D/C:            Co-evaluation              AM-PAC PT "6 Clicks" Daily Activity     Outcome Measure Help from another person eating meals?: A Little Help from another person taking care of personal grooming?: A Little Help from another person toileting, which includes using toliet, bedpan, or urinal?: None Help from another person bathing (including washing, rinsing, drying)?: A Little Help from another person to put on and taking off regular upper body clothing?: A Lot Help from another person to put on and taking off  regular lower body clothing?: A Lot 6 Click Score: 17   End of Session Equipment Utilized During Treatment: Other (comment)(sling) Nurse Communication: Mobility status;Weight bearing status  Activity Tolerance: Patient tolerated treatment well Patient left: in chair;with call bell/phone within reach;with family/visitor present  OT Visit Diagnosis: Pain Pain - Right/Left: Right Pain - part of body: Shoulder                Time: 4098-1191 OT Time Calculation (min): 39 min Charges:  OT General Charges $OT Visit: 1 Visit OT Evaluation $OT Eval Moderate Complexity: 1 Mod OT Treatments $Self Care/Home Management : 8-22 mins $Therapeutic Exercise: 8-22 mins  Hulda Humphrey OTR/L Acute Rehabilitation Services Pager: [phone] Office: [place] 08/20/2018, 11:37 AM

## 2018-10-21 ENCOUNTER — Telehealth: Payer: Self-pay | Admitting: Adult Health

## 2018-10-21 MED ORDER — LORAZEPAM 1 MG PO TABS: 1.0000 mg | ORAL_TABLET | Freq: Three times a day (TID) | ORAL | 2 refills | Status: DC | PRN

## 2018-10-21 NOTE — Telephone Encounter (Signed)
Patient needs refill on Rx  LORazepam (ATIVAN) 1 MG tablet  Sent to:  [id number] - [place], [place] - [address] [phone] (Phone) [phone] (Fax)

## 2018-12-02 ENCOUNTER — Ambulatory Visit (INDEPENDENT_AMBULATORY_CARE_PROVIDER_SITE_OTHER): Payer: Medicare Other | Admitting: Adult Health

## 2018-12-02 ENCOUNTER — Encounter: Payer: Self-pay | Admitting: Adult Health

## 2018-12-02 VITALS — BP 140/80 | HR 78 | Temp 97.7°F | Ht 67.0 in | Wt 179.3 lb

## 2018-12-02 DIAGNOSIS — Z0000 Encounter for general adult medical examination without abnormal findings: Secondary | ICD-10-CM | POA: Diagnosis not present

## 2018-12-02 DIAGNOSIS — F419 Anxiety disorder, unspecified: Secondary | ICD-10-CM

## 2018-12-02 DIAGNOSIS — N529 Male erectile dysfunction, unspecified: Secondary | ICD-10-CM

## 2018-12-02 DIAGNOSIS — N400 Enlarged prostate without lower urinary tract symptoms: Secondary | ICD-10-CM

## 2018-12-02 LAB — CBC WITH DIFFERENTIAL/PLATELET
BASOS PCT: 0.9 % (ref 0.0–3.0)
Basophils Absolute: 0 10*3/uL (ref 0.0–0.1)
EOS PCT: 2.7 % (ref 0.0–5.0)
Eosinophils Absolute: 0.1 10*3/uL (ref 0.0–0.7)
HEMATOCRIT: 43.6 % (ref 39.0–52.0)
HEMOGLOBIN: 14.9 g/dL (ref 13.0–17.0)
LYMPHS PCT: 14.1 % (ref 12.0–46.0)
Lymphs Abs: 0.7 10*3/uL (ref 0.7–4.0)
MCHC: 34 g/dL (ref 30.0–36.0)
MCV: 93.2 fl (ref 78.0–100.0)
Monocytes Absolute: 0.3 10*3/uL (ref 0.1–1.0)
Monocytes Relative: 6.6 % (ref 3.0–12.0)
Neutro Abs: 4 10*3/uL (ref 1.4–7.7)
Neutrophils Relative %: 75.7 % (ref 43.0–77.0)
Platelets: 183 10*3/uL (ref 150.0–400.0)
RBC: 4.68 Mil/uL (ref 4.22–5.81)
RDW: 14.2 % (ref 11.5–15.5)
WBC: 5.3 10*3/uL (ref 4.0–10.5)

## 2018-12-02 LAB — COMPREHENSIVE METABOLIC PANEL
ALBUMIN: 4.3 g/dL (ref 3.5–5.2)
ALK PHOS: 53 U/L (ref 39–117)
ALT: 15 U/L (ref 0–53)
AST: 20 U/L (ref 0–37)
BILIRUBIN TOTAL: 0.6 mg/dL (ref 0.2–1.2)
BUN: 14 mg/dL (ref 6–23)
CALCIUM: 9.5 mg/dL (ref 8.4–10.5)
CHLORIDE: 105 meq/L (ref 96–112)
CO2: 31 mEq/L (ref 19–32)
CREATININE: 0.85 mg/dL (ref 0.40–1.50)
GFR: 87.04 mL/min (ref >60.00)
Glucose, Bld: 89 mg/dL (ref 70–99)
Potassium: 4.2 mEq/L (ref 3.5–5.1)
Sodium: 142 mEq/L (ref 135–145)
TOTAL PROTEIN: 6.4 g/dL (ref 6.0–8.3)

## 2018-12-02 LAB — TSH: TSH: 1.71 u[IU]/mL (ref 0.35–4.50)

## 2018-12-02 LAB — LIPID PANEL
CHOLESTEROL: 177 mg/dL (ref 0–200)
HDL: 53.3 mg/dL (ref >39.00)
LDL CALC: 106 mg/dL — AB (ref 0–99)
NonHDL: 123.53
Total CHOL/HDL Ratio: 3
Triglycerides: 86 mg/dL (ref 0.0–149.0)
VLDL: 17.2 mg/dL (ref 0.0–40.0)

## 2018-12-02 LAB — PSA: PSA: 5.4 ng/mL — AB (ref 0.10–4.00)

## 2018-12-02 NOTE — Progress Notes (Signed)
Subjective:    Patient ID: Paul Schwartz, male    DOB: 06/05/1941, 78 y.o.   MRN: [id number]  HPI Patient presents for yearly preventative medicine examination. He is a pleasant 78 year old male who  has a past medical history of Arthritis, Bilateral shoulder pain, BPH (benign prostatic hyperplasia), GERD (gastroesophageal reflux disease), History of kidney stones, and Vertigo.   Anxiety - Takes Ativan 1 mg PRN   ED - takes Cialis 10 mg PRN   H/o BPH - not currently taking medication. Denies any issues     All immunizations and health maintenance protocols were reviewed with the patient and needed orders were placed- UTD   Appropriate screening laboratory values were ordered for the patient including screening of hyperlipidemia, renal function and hepatic function. If indicated by BPH, a PSA was ordered.  Medication reconciliation,  past medical history, social history, problem list and allergies were reviewed in detail with the patient  Goals were established with regard to weight loss, exercise, and  diet in compliance with medications. He continues to exercise and eat healthy.  Wt Readings from Last 3 Encounters:  12/02/18 179 lb 5 oz (81.3 kg)  08/19/18 176 lb 12.8 oz (80.2 kg)  08/10/18 176 lb 12.8 oz (80.2 kg)   End of life planning was discussed. He has an advanced directive and living will   He is up to date on routine screening colonoscopies, dental, and vision exams.   Interval history includes  - [facility] total arthroplasty of right shoulder in October 2019- Continues to go to PT. Feels as though he has been improving but still does have pain. Has follow up with orthopedics next month.   Review of Systems  Constitutional: Negative.   HENT: Negative.   Eyes: Negative.   Respiratory: Negative.   Cardiovascular: Negative.   Gastrointestinal: Negative.   Endocrine: Negative.   Genitourinary: Negative.   Musculoskeletal: Positive for arthralgias.    Skin: Negative.   Allergic/Immunologic: Negative.   Neurological: Negative.   Hematological: Negative.   Psychiatric/Behavioral: Negative.   All other systems reviewed and are negative.  Past Medical History:  Diagnosis Date  . Arthritis   . Bilateral shoulder pain   . BPH (benign prostatic hyperplasia)   . GERD (gastroesophageal reflux disease)   . History of kidney stones   . Vertigo    dizziness    Social History   Socioeconomic History  . Marital status: Married    Spouse name: Not on file  . Number of children: Not on file  . Years of education: Not on file  . Highest education level: Not on file  Occupational History  . Not on file  Social Needs  . Financial resource strain: Not on file  . Food insecurity:    Worry: Not on file    Inability: Not on file  . Transportation needs:    Medical: Not on file    Non-medical: Not on file  Tobacco Use  . Smoking status: Former Smoker    Types: Cigarettes    Last attempt to quit: 04/07/1968    Years since quitting: 50.6  . Smokeless tobacco: Never Used  Substance and Sexual Activity  . Alcohol use: Yes    Alcohol/week: 4.0 standard drinks    Types: 4 Glasses of wine per week    Comment: weekends  . Drug use: No  . Sexual activity: Yes  Lifestyle  . Physical activity:    Days per week: Not on  file    Minutes per session: Not on file  . Stress: Not on file  Relationships  . Social connections:    Talks on phone: Not on file    Gets together: Not on file    Attends religious service: Not on file    Active member of club or organization: Not on file    Attends meetings of clubs or organizations: Not on file    Relationship status: Not on file  . Intimate partner violence:    Fear of current or ex partner: Not on file    Emotionally abused: Not on file    Physically abused: Not on file    Forced sexual activity: Not on file  Other Topics Concern  . Not on file  Social History Narrative   Retired     Married - 64 years    One son that lives in [place]    2 grandchildren     Past Surgical History:  Procedure Laterality Date  . achilles tendon     surgical repair bilat  . CATARACT EXTRACTION, BILATERAL    . COLONOSCOPY    . REVERSE SHOULDER ARTHROPLASTY Right 08/19/2018   Procedure: REVERSE RIGHT SHOULDER ARTHROPLASTY;  Surgeon: Justice Britain, MD;  Location: [facility];  Service: Orthopedics;  Laterality: Right;  186min    Family History  Problem Relation Age of Onset  . Heart disease Father   . Colon cancer Neg Hx   . Esophageal cancer Neg Hx   . Stomach cancer Neg Hx   . Rectal cancer Neg Hx     Allergies  Allergen Reactions  . Plasticized Base [Plastibase] Hives and Rash    Current Outpatient Medications on File Prior to Visit  Medication Sig Dispense Refill  . aspirin 81 MG tablet Take 81 mg by mouth daily.     . diclofenac sodium (VOLTAREN) 1 % GEL Apply 1 application topically 2 (two) times daily.    Marland Kitchen LORazepam (ATIVAN) 1 MG tablet Take 1 tablet (1 mg total) by mouth 3 (three) times daily as needed for anxiety. 30 tablet 2  . Multiple Vitamin (MULTIVITAMIN) tablet Take 1 tablet by mouth daily.      . Omega-3 Fatty Acids (OMEGA 3 PO) Take 1 capsule by mouth 2 (two) times daily.     . ondansetron (ZOFRAN) 4 MG tablet Take 1 tablet (4 mg total) by mouth every 8 (eight) hours as needed for nausea or vomiting. 10 tablet 0  . oxyCODONE-acetaminophen (PERCOCET) 5-325 MG tablet Take 1 tablet by mouth every 4 (four) hours as needed (max 6 q). 20 tablet 0   No current facility-administered medications on file prior to visit.     BP 140/80 (BP Location: Left Arm, Patient Position: Sitting, Cuff Size: Normal)   Pulse 78   Temp 97.7 F (36.5 C) (Oral)   Ht 5\' 7"  (1.702 m)   Wt 179 lb 5 oz (81.3 kg)   SpO2 96%   BMI 28.08 kg/m        Objective:   Physical Exam Vitals signs and nursing note reviewed.  Constitutional:      General: He is not in acute distress.     Appearance: Normal appearance. He is well-developed and normal weight. He is not diaphoretic.  HENT:     Head: Normocephalic and atraumatic.     Right Ear: Tympanic membrane, ear canal and external ear normal. There is no impacted cerumen.     Left Ear: Tympanic membrane, ear canal  and external ear normal. There is no impacted cerumen.     Nose: Nose normal. No congestion or rhinorrhea.     Mouth/Throat:     Mouth: Mucous membranes are moist.     Pharynx: Oropharynx is clear. No oropharyngeal exudate or posterior oropharyngeal erythema.  Eyes:     General:        Right eye: No discharge.        Left eye: No discharge.     Conjunctiva/sclera: Conjunctivae normal.     Pupils: Pupils are equal, round, and reactive to light.  Neck:     Musculoskeletal: Normal range of motion. No neck rigidity or muscular tenderness.     Thyroid: No thyromegaly.     Vascular: No carotid bruit.     Trachea: No tracheal deviation.  Cardiovascular:     Rate and Rhythm: Normal rate and regular rhythm.     Pulses: Normal pulses.     Heart sounds: Normal heart sounds. No murmur. No friction rub. No gallop.   Pulmonary:     Effort: Pulmonary effort is normal. No respiratory distress.     Breath sounds: Normal breath sounds. No stridor. No wheezing, rhonchi or rales.  Chest:     Chest wall: No tenderness.  Abdominal:     General: Bowel sounds are normal. There is no distension.     Palpations: Abdomen is soft. There is no mass.     Tenderness: There is no abdominal tenderness. There is no right CVA tenderness, left CVA tenderness, guarding or rebound.     Hernia: No hernia is present.  Musculoskeletal: Normal range of motion.        General: No swelling, tenderness, deformity or signs of injury.     Right lower leg: No edema.     Left lower leg: No edema.  Lymphadenopathy:     Cervical: No cervical adenopathy.  Skin:    General: Skin is warm and dry.     Capillary Refill: Capillary refill takes less than  2 seconds.     Coloration: Skin is not jaundiced or pale.     Findings: No bruising, erythema, lesion or rash.     Comments: Surgical scar on right shoulder   Neurological:     General: No focal deficit present.     Mental Status: He is alert and oriented to person, place, and time. Mental status is at baseline.     Cranial Nerves: No cranial nerve deficit.     Coordination: Coordination normal.  Psychiatric:        Mood and Affect: Mood normal.        Behavior: Behavior normal.        Thought Content: Thought content normal.        Judgment: Judgment normal.       Assessment & Plan:  1. Routine general medical examination at a health care facility - Continue to diet and exercise - Follow up in one year for CPE or sooner if needed - CBC with Differential/Platelet - Comprehensive metabolic panel - Lipid panel - TSH  2. Benign prostatic hyperplasia without lower urinary tract symptoms  - PSA  3. Anxiety - ok to continue with ativan PRN   4. Erectile dysfunction, unspecified erectile dysfunction type  - CBC with Differential/Platelet - Comprehensive metabolic panel - Lipid panel - TSH  Dorothyann Peng, NP

## 2018-12-02 NOTE — Addendum Note (Signed)
Addended by: Apolinar Junes on: 12/02/2018 09:20 AM   Modules accepted: Level of Service

## 2019-06-09 ENCOUNTER — Other Ambulatory Visit: Payer: Self-pay | Admitting: Adult Health

## 2019-08-13 ENCOUNTER — Ambulatory Visit (INDEPENDENT_AMBULATORY_CARE_PROVIDER_SITE_OTHER): Payer: Medicare Other

## 2019-08-13 DIAGNOSIS — Z23 Encounter for immunization: Secondary | ICD-10-CM | POA: Diagnosis not present

## 2019-08-25 ENCOUNTER — Telehealth: Payer: Self-pay | Admitting: Adult Health

## 2019-08-25 NOTE — Telephone Encounter (Signed)
Patient's wife, Linton Ham is calling to check if it is time for the patient's colonosopy? Patient is wanting referral to GI. Please advise Cb- [phone].

## 2019-08-25 NOTE — Telephone Encounter (Signed)
His last was in 2017 and do to polyps it was recommended at that time to have one done every five years.  Being over the age of 57 it is no longer recommended, gastroenterology will need to decide when the time comes if they want him to have another one

## 2019-08-31 NOTE — Telephone Encounter (Signed)
Left a message for a return call.

## 2019-09-01 NOTE — Telephone Encounter (Signed)
Left a message for a return call.

## 2019-09-02 NOTE — Telephone Encounter (Signed)
Left a message for a return call.  Have attempted to reach spouse multiple times.  Will now close the note.

## 2019-09-02 NOTE — Telephone Encounter (Signed)
Pt's wife returning call to [facility]

## 2019-09-07 NOTE — Telephone Encounter (Signed)
Called and spoke to [facility] and informed her of below message.  Nothing further needed.

## 2019-09-26 ENCOUNTER — Other Ambulatory Visit: Payer: Self-pay | Admitting: Adult Health

## 2019-10-24 ENCOUNTER — Telehealth: Payer: Self-pay | Admitting: Adult Health

## 2019-10-24 NOTE — Telephone Encounter (Signed)
The patient needs a refill on  LORazepam (ATIVAN) 1 MG tablet  Sent to:  [id number] - [place], [place] - [address] AT [address] Phone:  [phone]  Fax:  [phone]

## 2019-10-25 MED ORDER — LORAZEPAM 1 MG PO TABS: 1.0000 mg | ORAL_TABLET | Freq: Three times a day (TID) | ORAL | 1 refills | Status: DC | PRN

## 2019-10-25 NOTE — Addendum Note (Signed)
Addended by: Apolinar Junes on: 10/25/2019 09:20 AM   Modules accepted: Orders

## 2019-12-06 ENCOUNTER — Ambulatory Visit (INDEPENDENT_AMBULATORY_CARE_PROVIDER_SITE_OTHER): Payer: Medicare Other | Admitting: Adult Health

## 2019-12-06 ENCOUNTER — Encounter: Payer: Self-pay | Admitting: Adult Health

## 2019-12-06 ENCOUNTER — Other Ambulatory Visit: Payer: Self-pay

## 2019-12-06 VITALS — BP 130/80 | HR 53 | Temp 97.9°F | Ht 67.0 in | Wt 173.0 lb

## 2019-12-06 DIAGNOSIS — N400 Enlarged prostate without lower urinary tract symptoms: Secondary | ICD-10-CM

## 2019-12-06 DIAGNOSIS — Z0000 Encounter for general adult medical examination without abnormal findings: Secondary | ICD-10-CM | POA: Diagnosis not present

## 2019-12-06 DIAGNOSIS — F419 Anxiety disorder, unspecified: Secondary | ICD-10-CM

## 2019-12-06 NOTE — Patient Instructions (Signed)
It was great seeing you today   Continue to stay active and exercise.   Please go to the [facility] lab for your blood work   I will follow up with you regarding your blood work

## 2019-12-06 NOTE — Progress Notes (Signed)
Subjective:    Patient ID: Paul Schwartz, male    DOB: Mar 23, 1941, 79 y.o.   MRN: [id number]  HPI Patient presents for yearly preventative medicine examination. He is a pleasant and remarkable 79 year old male who  has a past medical history of Arthritis, Bilateral shoulder pain, BPH (benign prostatic hyperplasia), GERD (gastroesophageal reflux disease), History of kidney stones, and Vertigo.  Anxiety - takes Ativan 1 mg daily.   H/o BPH -not currently prescribed medication.  Denies issues. Is seen by Urology routinely?  Lab Results  Component Value Date   PSA 5.40 (H) 12/02/2018   PSA 5.29 (H) 11/25/2017   PSA 4.87 (H) 10/31/2016   All immunizations and health maintenance protocols were reviewed with the patient and needed orders were placed. UTD on vaccinations   Appropriate screening laboratory values were ordered for the patient including screening of hyperlipidemia, renal function and hepatic function. If indicated by BPH, a PSA was ordered.  Medication reconciliation,  past medical history, social history, problem list and allergies were reviewed in detail with the patient  Goals were established with regard to weight loss, exercise, and  diet in compliance with medications.  He eats a heart healthy diet and tries to run about 6 miles a day.  Wt Readings from Last 3 Encounters:  12/06/19 173 lb (78.5 kg)  12/02/18 179 lb 5 oz (81.3 kg)  08/19/18 176 lb 12.8 oz (80.2 kg)    End of life planning was discussed.  He is up-to-date on routine colonoscopy, dental and vision screens.    Review of Systems  Constitutional: Negative.   HENT: Negative.   Eyes: Negative.   Respiratory: Negative.   Cardiovascular: Negative.   Gastrointestinal: Negative.   Endocrine: Negative.   Genitourinary: Negative.   Musculoskeletal: Negative.   Skin: Negative.   Allergic/Immunologic: Negative.   Neurological: Negative.   Hematological: Negative.   Psychiatric/Behavioral:  Negative.   All other systems reviewed and are negative.  Past Medical History:  Diagnosis Date  . Arthritis   . Bilateral shoulder pain   . BPH (benign prostatic hyperplasia)   . GERD (gastroesophageal reflux disease)   . History of kidney stones   . Vertigo    dizziness    Social History   Socioeconomic History  . Marital status: Married    Spouse name: Not on file  . Number of children: Not on file  . Years of education: Not on file  . Highest education level: Not on file  Occupational History  . Not on file  Tobacco Use  . Smoking status: Former Smoker    Types: Cigarettes    Quit date: 04/07/1968    Years since quitting: 51.6  . Smokeless tobacco: Never Used  Substance and Sexual Activity  . Alcohol use: Yes    Alcohol/week: 4.0 standard drinks    Types: 4 Glasses of wine per week    Comment: weekends  . Drug use: No  . Sexual activity: Yes  Other Topics Concern  . Not on file  Social History Narrative   Retired    Married - 73 years    One son that lives in [place]    2 grandchildren    Social Determinants of Health   Financial Resource Strain:   . Difficulty of Paying Living Expenses: Not on file  Food Insecurity:   . Worried About Charity fundraiser in the Last Year: Not on file  . Ran Out of Food in the Last  Year: Not on file  Transportation Needs:   . Lack of Transportation (Medical): Not on file  . Lack of Transportation (Non-Medical): Not on file  Physical Activity:   . Days of Exercise per Week: Not on file  . Minutes of Exercise per Session: Not on file  Stress:   . Feeling of Stress : Not on file  Social Connections:   . Frequency of Communication with Friends and Family: Not on file  . Frequency of Social Gatherings with Friends and Family: Not on file  . Attends Religious Services: Not on file  . Active Member of Clubs or Organizations: Not on file  . Attends Archivist Meetings: Not on file  . Marital Status: Not on file    Intimate Partner Violence:   . Fear of Current or Ex-Partner: Not on file  . Emotionally Abused: Not on file  . Physically Abused: Not on file  . Sexually Abused: Not on file    Past Surgical History:  Procedure Laterality Date  . achilles tendon     surgical repair bilat  . CATARACT EXTRACTION, BILATERAL    . COLONOSCOPY    . REVERSE SHOULDER ARTHROPLASTY Right 08/19/2018   Procedure: REVERSE RIGHT SHOULDER ARTHROPLASTY;  Surgeon: Justice Britain, MD;  Location: [facility];  Service: Orthopedics;  Laterality: Right;  179min    Family History  Problem Relation Age of Onset  . Heart disease Father   . Colon cancer Neg Hx   . Esophageal cancer Neg Hx   . Stomach cancer Neg Hx   . Rectal cancer Neg Hx     Allergies  Allergen Reactions  . Plasticized Base [Plastibase] Hives and Rash    Current Outpatient Medications on File Prior to Visit  Medication Sig Dispense Refill  . LORazepam (ATIVAN) 1 MG tablet Take 1 tablet (1 mg total) by mouth every 8 (eight) hours as needed for anxiety. 90 tablet 1  . Multiple Vitamin (MULTIVITAMIN) tablet Take 1 tablet by mouth daily.      . Omega-3 Fatty Acids (OMEGA 3 PO) Take 1 capsule by mouth 2 (two) times daily.     Marland Kitchen oxyCODONE-acetaminophen (PERCOCET) 5-325 MG tablet Take 1 tablet by mouth every 4 (four) hours as needed (max 6 q). 20 tablet 0   No current facility-administered medications on file prior to visit.    BP 130/80   Pulse (!) 53   Temp 97.9 F (36.6 C) (Other (Comment))   Ht 5\' 7"  (1.702 m)   Wt 173 lb (78.5 kg)   SpO2 99%   BMI 27.10 kg/m       Objective:   Physical Exam Vitals and nursing note reviewed.  Constitutional:      General: He is not in acute distress.    Appearance: Normal appearance. He is well-developed. He is not ill-appearing, toxic-appearing or diaphoretic.  HENT:     Head: Normocephalic and atraumatic.     Right Ear: Tympanic membrane, ear canal and external ear normal. There is no impacted  cerumen.     Left Ear: Tympanic membrane, ear canal and external ear normal. There is no impacted cerumen.     Nose: Nose normal. No congestion or rhinorrhea.     Mouth/Throat:     Mouth: Mucous membranes are moist.     Pharynx: Oropharynx is clear. No oropharyngeal exudate or posterior oropharyngeal erythema.  Eyes:     General:        Right eye: No discharge.  Left eye: No discharge.     Conjunctiva/sclera: Conjunctivae normal.     Pupils: Pupils are equal, round, and reactive to light.  Neck:     Thyroid: No thyromegaly.     Trachea: No tracheal deviation.  Cardiovascular:     Rate and Rhythm: Normal rate and regular rhythm.     Pulses: Normal pulses.     Heart sounds: Normal heart sounds. No murmur. No friction rub. No gallop.   Pulmonary:     Effort: Pulmonary effort is normal. No respiratory distress.     Breath sounds: Normal breath sounds. No stridor. No wheezing, rhonchi or rales.  Chest:     Chest wall: No tenderness.  Abdominal:     General: Abdomen is flat. Bowel sounds are normal. There is no distension.     Palpations: Abdomen is soft. There is no mass.     Tenderness: There is no abdominal tenderness. There is no right CVA tenderness, left CVA tenderness, guarding or rebound.     Hernia: No hernia is present.  Musculoskeletal:        General: No swelling, tenderness, deformity or signs of injury. Normal range of motion.     Cervical back: Normal range of motion.     Right lower leg: No edema.     Left lower leg: No edema.  Lymphadenopathy:     Cervical: No cervical adenopathy.  Skin:    General: Skin is warm and dry.     Capillary Refill: Capillary refill takes less than 2 seconds.     Coloration: Skin is not jaundiced or pale.     Findings: No bruising, erythema, lesion or rash.  Neurological:     General: No focal deficit present.     Mental Status: He is alert and oriented to person, place, and time. Mental status is at baseline.     Cranial Nerves:  No cranial nerve deficit.     Sensory: No sensory deficit.     Motor: No weakness.     Coordination: Coordination normal.     Gait: Gait normal.     Deep Tendon Reflexes: Reflexes normal.  Psychiatric:        Mood and Affect: Mood normal.        Behavior: Behavior normal.        Thought Content: Thought content normal.        Judgment: Judgment normal.       Assessment & Plan:  1. Routine general medical examination at a health care facility - Continue to exercise and eat healthy  - Follow up in one year or sooner if needed - CBC with Differential/Platelet; Future - Comprehensive metabolic panel; Future - Lipid panel; Future - TSH; Future  2. Benign prostatic hyperplasia without lower urinary tract symptoms  - PSA; Future  3. Anxiety - Continue with Ativan    Dorothyann Peng, NP   This visit occurred during the SARS-CoV-2 public health emergency.  Safety protocols were in place, including screening questions prior to the visit, additional usage of staff PPE, and extensive cleaning of exam room while observing appropriate contact time as indicated for disinfecting solutions.

## 2019-12-07 ENCOUNTER — Other Ambulatory Visit: Payer: Medicare Other

## 2019-12-09 ENCOUNTER — Other Ambulatory Visit: Payer: Self-pay

## 2019-12-09 ENCOUNTER — Other Ambulatory Visit (INDEPENDENT_AMBULATORY_CARE_PROVIDER_SITE_OTHER): Payer: Medicare Other

## 2019-12-09 DIAGNOSIS — Z0000 Encounter for general adult medical examination without abnormal findings: Secondary | ICD-10-CM

## 2019-12-09 DIAGNOSIS — N400 Enlarged prostate without lower urinary tract symptoms: Secondary | ICD-10-CM | POA: Diagnosis not present

## 2019-12-09 LAB — LIPID PANEL
Cholesterol: 176 mg/dL (ref 0–200)
HDL: 62 mg/dL (ref >39.00)
LDL Cholesterol: 93 mg/dL (ref 0–99)
NonHDL: 113.71
Total CHOL/HDL Ratio: 3
Triglycerides: 103 mg/dL (ref 0.0–149.0)
VLDL: 20.6 mg/dL (ref 0.0–40.0)

## 2019-12-09 LAB — COMPREHENSIVE METABOLIC PANEL
ALT: 16 U/L (ref 0–53)
AST: 18 U/L (ref 0–37)
Albumin: 4.3 g/dL (ref 3.5–5.2)
Alkaline Phosphatase: 47 U/L (ref 39–117)
BUN: 18 mg/dL (ref 6–23)
CO2: 32 mEq/L (ref 19–32)
Calcium: 9.4 mg/dL (ref 8.4–10.5)
Chloride: 102 mEq/L (ref 96–112)
Creatinine, Ser: 0.78 mg/dL (ref 0.40–1.50)
GFR: 95.87 mL/min (ref >60.00)
Glucose, Bld: 85 mg/dL (ref 70–99)
Potassium: 3.9 mEq/L (ref 3.5–5.1)
Sodium: 140 mEq/L (ref 135–145)
Total Bilirubin: 0.8 mg/dL (ref 0.2–1.2)
Total Protein: 6.5 g/dL (ref 6.0–8.3)

## 2019-12-09 LAB — CBC WITH DIFFERENTIAL/PLATELET
Basophils Absolute: 0 10*3/uL (ref 0.0–0.1)
Basophils Relative: 0.9 % (ref 0.0–3.0)
Eosinophils Absolute: 0.1 10*3/uL (ref 0.0–0.7)
Eosinophils Relative: 2.2 % (ref 0.0–5.0)
HCT: 43.5 % (ref 39.0–52.0)
Hemoglobin: 15 g/dL (ref 13.0–17.0)
Lymphocytes Relative: 18.3 % (ref 12.0–46.0)
Lymphs Abs: 0.9 10*3/uL (ref 0.7–4.0)
MCHC: 34.5 g/dL (ref 30.0–36.0)
MCV: 95.1 fl (ref 78.0–100.0)
Monocytes Absolute: 0.4 10*3/uL (ref 0.1–1.0)
Monocytes Relative: 8.4 % (ref 3.0–12.0)
Neutro Abs: 3.4 10*3/uL (ref 1.4–7.7)
Neutrophils Relative %: 70.2 % (ref 43.0–77.0)
Platelets: 174 10*3/uL (ref 150.0–400.0)
RBC: 4.58 Mil/uL (ref 4.22–5.81)
RDW: 13.1 % (ref 11.5–15.5)
WBC: 4.8 10*3/uL (ref 4.0–10.5)

## 2019-12-09 LAB — TSH: TSH: 2.32 u[IU]/mL (ref 0.35–4.50)

## 2019-12-09 LAB — PSA: PSA: 5.45 ng/mL — ABNORMAL HIGH (ref 0.10–4.00)

## 2019-12-14 DIAGNOSIS — H35033 Hypertensive retinopathy, bilateral: Secondary | ICD-10-CM | POA: Diagnosis not present

## 2019-12-14 DIAGNOSIS — H26492 Other secondary cataract, left eye: Secondary | ICD-10-CM | POA: Diagnosis not present

## 2019-12-14 DIAGNOSIS — H353131 Nonexudative age-related macular degeneration, bilateral, early dry stage: Secondary | ICD-10-CM | POA: Diagnosis not present

## 2019-12-14 DIAGNOSIS — H40013 Open angle with borderline findings, low risk, bilateral: Secondary | ICD-10-CM | POA: Diagnosis not present

## 2020-01-05 DIAGNOSIS — R311 Benign essential microscopic hematuria: Secondary | ICD-10-CM | POA: Diagnosis not present

## 2020-01-17 DIAGNOSIS — R311 Benign essential microscopic hematuria: Secondary | ICD-10-CM | POA: Diagnosis not present

## 2020-01-17 DIAGNOSIS — R3129 Other microscopic hematuria: Secondary | ICD-10-CM | POA: Diagnosis not present

## 2020-01-24 DIAGNOSIS — R311 Benign essential microscopic hematuria: Secondary | ICD-10-CM | POA: Diagnosis not present

## 2020-04-12 ENCOUNTER — Other Ambulatory Visit: Payer: Self-pay | Admitting: Adult Health

## 2020-06-12 DIAGNOSIS — H40013 Open angle with borderline findings, low risk, bilateral: Secondary | ICD-10-CM | POA: Diagnosis not present

## 2020-07-12 ENCOUNTER — Other Ambulatory Visit: Payer: Self-pay

## 2020-07-13 ENCOUNTER — Ambulatory Visit (INDEPENDENT_AMBULATORY_CARE_PROVIDER_SITE_OTHER): Payer: Medicare Other | Admitting: Adult Health

## 2020-07-13 ENCOUNTER — Encounter: Payer: Self-pay | Admitting: Adult Health

## 2020-07-13 VITALS — BP 138/80 | HR 80 | Temp 97.9°F | Ht 67.0 in | Wt 175.4 lb

## 2020-07-13 DIAGNOSIS — F419 Anxiety disorder, unspecified: Secondary | ICD-10-CM

## 2020-07-13 MED ORDER — CITALOPRAM HYDROBROMIDE 10 MG PO TABS: 10.0000 mg | ORAL_TABLET | Freq: Every day | ORAL | 0 refills | Status: DC

## 2020-07-13 NOTE — Progress Notes (Signed)
Subjective:    Patient ID: Paul Schwartz, male    DOB: 03/16/1941, 79 y.o.   MRN: [id number]  HPI  79 year old male who  has a past medical history of Arthritis, Bilateral shoulder pain, BPH (benign prostatic hyperplasia), GERD (gastroesophageal reflux disease), History of kidney stones, and Vertigo.  He presents to the office today with his wife for worsening anxiety.  He has a long history of anxiety and is prescribed Ativan 1 mg to take as needed.  He does not like taking this medication or other pills but his anxiety has been getting worse.  He started to develop panic attacks and he cannot necessarily put his finger on what is causing his anxiety symptoms.  His wife reports that he is always been high strung.  Patient would like to try something that he can take on a daily basis to help prevent anxiety and panic attacks.  Review of Systems See HPI   Past Medical History:  Diagnosis Date  . Arthritis   . Bilateral shoulder pain   . BPH (benign prostatic hyperplasia)   . GERD (gastroesophageal reflux disease)   . History of kidney stones   . Vertigo    dizziness    Social History   Socioeconomic History  . Marital status: Married    Spouse name: Not on file  . Number of children: Not on file  . Years of education: Not on file  . Highest education level: Not on file  Occupational History  . Not on file  Tobacco Use  . Smoking status: Former Smoker    Types: Cigarettes    Quit date: 04/07/1968    Years since quitting: 52.3  . Smokeless tobacco: Never Used  Vaping Use  . Vaping Use: Never used  Substance and Sexual Activity  . Alcohol use: Yes    Alcohol/week: 4.0 standard drinks    Types: 4 Glasses of wine per week    Comment: weekends  . Drug use: No  . Sexual activity: Yes  Other Topics Concern  . Not on file  Social History Narrative   Retired    Married - 24 years    One son that lives in [place]    2 grandchildren    Social Determinants of Health     Financial Resource Strain:   . Difficulty of Paying Living Expenses: Not on file  Food Insecurity:   . Worried About Charity fundraiser in the Last Year: Not on file  . Ran Out of Food in the Last Year: Not on file  Transportation Needs:   . Lack of Transportation (Medical): Not on file  . Lack of Transportation (Non-Medical): Not on file  Physical Activity:   . Days of Exercise per Week: Not on file  . Minutes of Exercise per Session: Not on file  Stress:   . Feeling of Stress : Not on file  Social Connections:   . Frequency of Communication with Friends and Family: Not on file  . Frequency of Social Gatherings with Friends and Family: Not on file  . Attends Religious Services: Not on file  . Active Member of Clubs or Organizations: Not on file  . Attends Archivist Meetings: Not on file  . Marital Status: Not on file  Intimate Partner Violence:   . Fear of Current or Ex-Partner: Not on file  . Emotionally Abused: Not on file  . Physically Abused: Not on file  . Sexually Abused: Not on  file    Past Surgical History:  Procedure Laterality Date  . achilles tendon     surgical repair bilat  . CATARACT EXTRACTION, BILATERAL    . COLONOSCOPY    . REVERSE SHOULDER ARTHROPLASTY Right 08/19/2018   Procedure: REVERSE RIGHT SHOULDER ARTHROPLASTY;  Surgeon: Justice Britain, MD;  Location: [facility];  Service: Orthopedics;  Laterality: Right;  128min    Family History  Problem Relation Age of Onset  . Heart disease Father   . Colon cancer Neg Hx   . Esophageal cancer Neg Hx   . Stomach cancer Neg Hx   . Rectal cancer Neg Hx     Allergies  Allergen Reactions  . Plasticized Base [Plastibase] Hives and Rash    Current Outpatient Medications on File Prior to Visit  Medication Sig Dispense Refill  . LORazepam (ATIVAN) 1 MG tablet TAKE 1 TABLET (1 MG TOTAL) BY MOUTH EVERY 8 (EIGHT) HOURS AS NEEDED FOR ANXIETY. 90 tablet 1  . Multiple Vitamin (MULTIVITAMIN) tablet Take 1  tablet by mouth daily.      . Omega-3 Fatty Acids (OMEGA 3 PO) Take 1 capsule by mouth 2 (two) times daily.     Marland Kitchen oxyCODONE-acetaminophen (PERCOCET) 5-325 MG tablet Take 1 tablet by mouth every 4 (four) hours as needed (max 6 q). 20 tablet 0   No current facility-administered medications on file prior to visit.    BP 138/80   Pulse 80   Temp 97.9 F (36.6 C) (Oral)   Ht 5\' 7"  (1.702 m)   Wt 175 lb 6 oz (79.5 kg)   SpO2 98%   BMI 27.47 kg/m       Objective:   Physical Exam Vitals and nursing note reviewed.  Constitutional:      Appearance: Normal appearance.  Cardiovascular:     Rate and Rhythm: Normal rate and regular rhythm.     Pulses: Normal pulses.     Heart sounds: Normal heart sounds.  Pulmonary:     Effort: Pulmonary effort is normal.     Breath sounds: Normal breath sounds.  Skin:    General: Skin is warm and dry.     Capillary Refill: Capillary refill takes less than 2 seconds.  Neurological:     General: No focal deficit present.     Mental Status: He is alert and oriented to person, place, and time.  Psychiatric:        Mood and Affect: Mood normal.        Behavior: Behavior normal.        Thought Content: Thought content normal.        Judgment: Judgment normal.       Assessment & Plan:  1. Anxiety -We will start him on Celexa 10 mg daily.  He was advised that he can continue to take Ativan as needed.  I will have him follow-up in 30 days or sooner if needed - citalopram (CELEXA) 10 MG tablet; Take 1 tablet (10 mg total) by mouth daily.  Dispense: 30 tablet; Refill: 0   Dorothyann Peng, NP

## 2020-07-13 NOTE — Patient Instructions (Signed)
It was great seeing you today   I am going to prescribe you an anti anxiety medication called Celexa - takes this every day   Follow up with me in one month

## 2020-08-04 ENCOUNTER — Other Ambulatory Visit: Payer: Self-pay | Admitting: Adult Health

## 2020-08-04 DIAGNOSIS — F419 Anxiety disorder, unspecified: Secondary | ICD-10-CM

## 2020-08-07 ENCOUNTER — Ambulatory Visit: Payer: Medicare Other | Admitting: Adult Health

## 2020-08-15 ENCOUNTER — Encounter: Payer: Self-pay | Admitting: Adult Health

## 2020-08-15 ENCOUNTER — Ambulatory Visit (INDEPENDENT_AMBULATORY_CARE_PROVIDER_SITE_OTHER): Payer: Medicare Other | Admitting: Adult Health

## 2020-08-15 ENCOUNTER — Other Ambulatory Visit: Payer: Self-pay

## 2020-08-15 DIAGNOSIS — F419 Anxiety disorder, unspecified: Secondary | ICD-10-CM | POA: Diagnosis not present

## 2020-08-15 MED ORDER — CITALOPRAM HYDROBROMIDE 10 MG PO TABS: 10.0000 mg | ORAL_TABLET | Freq: Every day | ORAL | 1 refills | Status: DC

## 2020-08-15 NOTE — Patient Instructions (Signed)
It was great seeing you today   I am going to keep you on this medication for another month   Please follow up in 3-4 weeks and we will do some cognitive testing on you

## 2020-08-15 NOTE — Progress Notes (Signed)
Subjective:    Patient ID: Paul Schwartz, male    DOB: 1941-08-15, 79 y.o.   MRN: [id number]  HPI 79 year old male who  has a past medical history of Arthritis, Bilateral shoulder pain, BPH (benign prostatic hyperplasia), GERD (gastroesophageal reflux disease), History of kidney stones, and Vertigo.  He presents to the office today for 1 month follow-up regarding anxiety.  He has a long history of anxiety and has been prescribed Ativan 1 mg as needed in the past.  When he was seen last month he reported that his anxiety was getting worse and he was starting to develop panic attacks.  He could not necessarily put his finger on what was causing his anxiety symptoms.  At this time his wife reported that he is always been "high strung".  And he wanted to try something to take on a daily daily basis to help with his anxiety and panic attacks.  At this time he was started on Celexa 10 mg daily.  Today he reports that he feels as though the medication many be working. He feels as though his anxiety is less severe. He denies side effects of the medication.  Review of Systems See HPI   Past Medical History:  Diagnosis Date  . Arthritis   . Bilateral shoulder pain   . BPH (benign prostatic hyperplasia)   . GERD (gastroesophageal reflux disease)   . History of kidney stones   . Vertigo    dizziness    Social History   Socioeconomic History  . Marital status: Married    Spouse name: Not on file  . Number of children: Not on file  . Years of education: Not on file  . Highest education level: Not on file  Occupational History  . Not on file  Tobacco Use  . Smoking status: Former Smoker    Types: Cigarettes    Quit date: 04/07/1968    Years since quitting: 52.3  . Smokeless tobacco: Never Used  Vaping Use  . Vaping Use: Never used  Substance and Sexual Activity  . Alcohol use: Yes    Alcohol/week: 4.0 standard drinks    Types: 4 Glasses of wine per week    Comment: weekends  .  Drug use: No  . Sexual activity: Yes  Other Topics Concern  . Not on file  Social History Narrative   Retired    Married - 73 years    One son that lives in [place]    2 grandchildren    Social Determinants of Health   Financial Resource Strain:   . Difficulty of Paying Living Expenses: Not on file  Food Insecurity:   . Worried About Charity fundraiser in the Last Year: Not on file  . Ran Out of Food in the Last Year: Not on file  Transportation Needs:   . Lack of Transportation (Medical): Not on file  . Lack of Transportation (Non-Medical): Not on file  Physical Activity:   . Days of Exercise per Week: Not on file  . Minutes of Exercise per Session: Not on file  Stress:   . Feeling of Stress : Not on file  Social Connections:   . Frequency of Communication with Friends and Family: Not on file  . Frequency of Social Gatherings with Friends and Family: Not on file  . Attends Religious Services: Not on file  . Active Member of Clubs or Organizations: Not on file  . Attends Archivist Meetings: Not  on file  . Marital Status: Not on file  Intimate Partner Violence:   . Fear of Current or Ex-Partner: Not on file  . Emotionally Abused: Not on file  . Physically Abused: Not on file  . Sexually Abused: Not on file    Past Surgical History:  Procedure Laterality Date  . achilles tendon     surgical repair bilat  . CATARACT EXTRACTION, BILATERAL    . COLONOSCOPY    . REVERSE SHOULDER ARTHROPLASTY Right 08/19/2018   Procedure: REVERSE RIGHT SHOULDER ARTHROPLASTY;  Surgeon: Justice Britain, MD;  Location: [facility];  Service: Orthopedics;  Laterality: Right;  165min    Family History  Problem Relation Age of Onset  . Heart disease Father   . Colon cancer Neg Hx   . Esophageal cancer Neg Hx   . Stomach cancer Neg Hx   . Rectal cancer Neg Hx     Allergies  Allergen Reactions  . Plasticized Base [Plastibase] Hives and Rash    Current Outpatient Medications on File  Prior to Visit  Medication Sig Dispense Refill  . citalopram (CELEXA) 10 MG tablet Take 1 tablet (10 mg total) by mouth daily. 30 tablet 0  . LORazepam (ATIVAN) 1 MG tablet TAKE 1 TABLET (1 MG TOTAL) BY MOUTH EVERY 8 (EIGHT) HOURS AS NEEDED FOR ANXIETY. 90 tablet 1  . Multiple Vitamin (MULTIVITAMIN) tablet Take 1 tablet by mouth daily.      . Omega-3 Fatty Acids (OMEGA 3 PO) Take 1 capsule by mouth 2 (two) times daily.     Marland Kitchen oxyCODONE-acetaminophen (PERCOCET) 5-325 MG tablet Take 1 tablet by mouth every 4 (four) hours as needed (max 6 q). 20 tablet 0   No current facility-administered medications on file prior to visit.    BP 128/68 (BP Location: Left Arm, Patient Position: Sitting, Cuff Size: Large)   Pulse 72   Temp 98 F (36.7 C) (Oral)   Resp 13   Ht 5\' 7"  (1.702 m)   Wt 170 lb 9.6 oz (77.4 kg)   SpO2 97%   BMI 26.72 kg/m       Objective:   Physical Exam Vitals and nursing note reviewed.  Constitutional:      Appearance: Normal appearance.  Cardiovascular:     Rate and Rhythm: Normal rate and regular rhythm.     Pulses: Normal pulses.     Heart sounds: Normal heart sounds.  Musculoskeletal:        General: Normal range of motion.  Skin:    General: Skin is warm and dry.  Neurological:     General: No focal deficit present.     Mental Status: He is alert and oriented to person, place, and time.       Assessment & Plan:  1. Anxiety - We discussed increasing the dose to see if he gets more benefit. He would like to keep the dose the same and will follow up in one month  - citalopram (CELEXA) 10 MG tablet; Take 1 tablet (10 mg total) by mouth daily.  Dispense: 30 tablet; Refill: 1  Dorothyann Peng, NP

## 2020-08-16 ENCOUNTER — Telehealth: Payer: Self-pay | Admitting: Adult Health

## 2020-08-16 NOTE — Telephone Encounter (Signed)
Pt is calling in stating that he had the [place] booster [employer]) 08/09/2020 at [facility] on [address], [place]. [place], [place]

## 2020-08-16 NOTE — Telephone Encounter (Signed)
Vaccine date was entered in the pts chart previously.

## 2020-09-04 ENCOUNTER — Telehealth: Payer: Self-pay | Admitting: Adult Health

## 2020-09-04 NOTE — Telephone Encounter (Signed)
Attempted to schedule AWV. Unable to LVM.  Will try at later time.  

## 2020-09-24 ENCOUNTER — Ambulatory Visit: Payer: Medicare Other

## 2020-10-18 ENCOUNTER — Other Ambulatory Visit: Payer: Self-pay | Admitting: Adult Health

## 2020-10-18 DIAGNOSIS — F419 Anxiety disorder, unspecified: Secondary | ICD-10-CM

## 2020-10-18 NOTE — Telephone Encounter (Signed)
Last OV 08/15/20 Last refill 08/15/20 #30/1 Next OV 12/06/20

## 2020-11-07 DIAGNOSIS — Z20822 Contact with and (suspected) exposure to covid-19: Secondary | ICD-10-CM | POA: Diagnosis not present

## 2020-11-12 DIAGNOSIS — Z20822 Contact with and (suspected) exposure to covid-19: Secondary | ICD-10-CM | POA: Diagnosis not present

## 2020-11-17 ENCOUNTER — Other Ambulatory Visit: Payer: Self-pay | Admitting: Adult Health

## 2020-11-17 DIAGNOSIS — F419 Anxiety disorder, unspecified: Secondary | ICD-10-CM

## 2020-11-20 DIAGNOSIS — F419 Anxiety disorder, unspecified: Secondary | ICD-10-CM | POA: Insufficient documentation

## 2020-11-20 NOTE — Telephone Encounter (Signed)
Filled on 10/18/2020 for 2 months.  Request is too early.

## 2020-12-06 ENCOUNTER — Encounter: Payer: Medicare Other | Admitting: Adult Health

## 2020-12-13 DIAGNOSIS — H35033 Hypertensive retinopathy, bilateral: Secondary | ICD-10-CM | POA: Diagnosis not present

## 2020-12-13 DIAGNOSIS — H40013 Open angle with borderline findings, low risk, bilateral: Secondary | ICD-10-CM | POA: Diagnosis not present

## 2020-12-13 DIAGNOSIS — H35013 Changes in retinal vascular appearance, bilateral: Secondary | ICD-10-CM | POA: Diagnosis not present

## 2020-12-13 DIAGNOSIS — H353131 Nonexudative age-related macular degeneration, bilateral, early dry stage: Secondary | ICD-10-CM | POA: Diagnosis not present

## 2020-12-23 ENCOUNTER — Other Ambulatory Visit: Payer: Self-pay | Admitting: Adult Health

## 2020-12-23 DIAGNOSIS — F419 Anxiety disorder, unspecified: Secondary | ICD-10-CM

## 2020-12-26 NOTE — Telephone Encounter (Signed)
SENT TO THE PHARMACY BY E-SCRIBE. 

## 2020-12-28 DIAGNOSIS — L720 Epidermal cyst: Secondary | ICD-10-CM | POA: Diagnosis not present

## 2021-01-08 ENCOUNTER — Ambulatory Visit (INDEPENDENT_AMBULATORY_CARE_PROVIDER_SITE_OTHER): Payer: Medicare Other | Admitting: Adult Health

## 2021-01-08 ENCOUNTER — Encounter: Payer: Self-pay | Admitting: Adult Health

## 2021-01-08 ENCOUNTER — Other Ambulatory Visit: Payer: Self-pay

## 2021-01-08 VITALS — BP 130/80 | HR 78 | Temp 98.3°F | Ht 67.0 in | Wt 182.0 lb

## 2021-01-08 DIAGNOSIS — Z0000 Encounter for general adult medical examination without abnormal findings: Secondary | ICD-10-CM

## 2021-01-08 DIAGNOSIS — F419 Anxiety disorder, unspecified: Secondary | ICD-10-CM | POA: Diagnosis not present

## 2021-01-08 DIAGNOSIS — N400 Enlarged prostate without lower urinary tract symptoms: Secondary | ICD-10-CM | POA: Diagnosis not present

## 2021-01-08 LAB — CBC WITH DIFFERENTIAL/PLATELET
Basophils Absolute: 0 10*3/uL (ref 0.0–0.1)
Basophils Relative: 0.6 % (ref 0.0–3.0)
Eosinophils Absolute: 0.1 10*3/uL (ref 0.0–0.7)
Eosinophils Relative: 2.1 % (ref 0.0–5.0)
HCT: 41.9 % (ref 39.0–52.0)
Hemoglobin: 14.8 g/dL (ref 13.0–17.0)
Lymphocytes Relative: 16.4 % (ref 12.0–46.0)
Lymphs Abs: 1 10*3/uL (ref 0.7–4.0)
MCHC: 35.2 g/dL (ref 30.0–36.0)
MCV: 94.1 fl (ref 78.0–100.0)
Monocytes Absolute: 0.5 10*3/uL (ref 0.1–1.0)
Monocytes Relative: 7.8 % (ref 3.0–12.0)
Neutro Abs: 4.3 10*3/uL (ref 1.4–7.7)
Neutrophils Relative %: 73.1 % (ref 43.0–77.0)
Platelets: 191 10*3/uL (ref 150.0–400.0)
RBC: 4.46 Mil/uL (ref 4.22–5.81)
RDW: 12.9 % (ref 11.5–15.5)
WBC: 5.9 10*3/uL (ref 4.0–10.5)

## 2021-01-08 LAB — LIPID PANEL
Cholesterol: 179 mg/dL (ref 0–200)
HDL: 55.6 mg/dL (ref >39.00)
LDL Cholesterol: 103 mg/dL — ABNORMAL HIGH (ref 0–99)
NonHDL: 123.05
Total CHOL/HDL Ratio: 3
Triglycerides: 102 mg/dL (ref 0.0–149.0)
VLDL: 20.4 mg/dL (ref 0.0–40.0)

## 2021-01-08 LAB — COMPREHENSIVE METABOLIC PANEL
ALT: 19 U/L (ref 0–53)
AST: 21 U/L (ref 0–37)
Albumin: 4.2 g/dL (ref 3.5–5.2)
Alkaline Phosphatase: 45 U/L (ref 39–117)
BUN: 18 mg/dL (ref 6–23)
CO2: 31 mEq/L (ref 19–32)
Calcium: 9.6 mg/dL (ref 8.4–10.5)
Chloride: 104 mEq/L (ref 96–112)
Creatinine, Ser: 0.86 mg/dL (ref 0.40–1.50)
GFR: 81.68 mL/min (ref >60.00)
Glucose, Bld: 91 mg/dL (ref 70–99)
Potassium: 3.9 mEq/L (ref 3.5–5.1)
Sodium: 139 mEq/L (ref 135–145)
Total Bilirubin: 0.8 mg/dL (ref 0.2–1.2)
Total Protein: 6.4 g/dL (ref 6.0–8.3)

## 2021-01-08 LAB — TSH: TSH: 2.18 u[IU]/mL (ref 0.35–4.50)

## 2021-01-08 MED ORDER — LORAZEPAM 1 MG PO TABS: 1.0000 mg | ORAL_TABLET | Freq: Three times a day (TID) | ORAL | 1 refills | Status: DC | PRN

## 2021-01-08 MED ORDER — CITALOPRAM HYDROBROMIDE 10 MG PO TABS: 10.0000 mg | ORAL_TABLET | Freq: Every day | ORAL | 1 refills | Status: DC

## 2021-01-08 NOTE — Addendum Note (Signed)
Addended by: Janann Colonel on: 01/08/2021 07:32 AM   Modules accepted: Orders

## 2021-01-08 NOTE — Patient Instructions (Signed)
It was great seeing you today! You look great!   I have kept all of your medications the same.   We will follow up with you regarding your blood work   Please see me in one year or sooner If needed

## 2021-01-08 NOTE — Progress Notes (Signed)
Subjective:    Patient ID: Paul Schwartz, male    DOB: May 05, 1941, 80 y.o.   MRN: [id number]  HPI Patient presents for yearly preventative medicine examination. He is a pleasant and remarkable 80 year old male who  has a past medical history of Arthritis, Bilateral shoulder pain, BPH (benign prostatic hyperplasia), GERD (gastroesophageal reflux disease), History of kidney stones, and Vertigo.  Anxiety -takes Celexa 10 mg and Ativan 1 mg every 8 ours PRN  Feels well controlled  History of BPH-not currently prescribed medication.  Asymptomatic.  He is seen by urology on a routine basis.  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  He was asymptomatic so they left this alone.  Also recommended against further PSA screening due to age  All immunizations and health maintenance protocols were reviewed with the patient and needed orders were placed.  Appropriate screening laboratory values were ordered for the patient including screening of hyperlipidemia, renal function and hepatic function. If indicated by BPH, a PSA was ordered.  Medication reconciliation,  past medical history, social history, problem list and allergies were reviewed in detail with the patient  Goals were established with regard to weight loss, exercise, and  diet in compliance with medications.  He eats a heart healthy diet and continues to exercise and the [employer] and running at home 4-5 miles a day.   Wt Readings from Last 3 Encounters:  01/08/21 182 lb (82.6 kg)  08/15/20 170 lb 9.6 oz (77.4 kg)  07/13/20 175 lb 6 oz (79.5 kg)   He has no acute complaints and states " I feel really good"   Review of Systems  Constitutional: Negative.   HENT: Negative.   Eyes: Negative.   Respiratory: Negative.   Cardiovascular: Negative.   Gastrointestinal: Negative.   Endocrine: Negative.   Genitourinary: Negative.   Musculoskeletal:  Negative.   Skin: Negative.   Allergic/Immunologic: Negative.   Neurological: Negative.   Hematological: Negative.   Psychiatric/Behavioral: Negative.   All other systems reviewed and are negative.  Past Medical History:  Diagnosis Date  . Arthritis   . Bilateral shoulder pain   . BPH (benign prostatic hyperplasia)   . GERD (gastroesophageal reflux disease)   . History of kidney stones   . Vertigo    dizziness    Social History   Socioeconomic History  . Marital status: Married    Spouse name: Not on file  . Number of children: Not on file  . Years of education: Not on file  . Highest education level: Not on file  Occupational History  . Not on file  Tobacco Use  . Smoking status: Former Smoker    Types: Cigarettes    Quit date: 04/07/1968    Years since quitting: 52.7  . Smokeless tobacco: Never Used  Vaping Use  . Vaping Use: Never used  Substance and Sexual Activity  . Alcohol use: Yes    Alcohol/week: 4.0 standard drinks    Types: 4 Glasses of wine per week    Comment: weekends  . Drug use: No  . Sexual activity: Yes  Other Topics Concern  . Not on file  Social History Narrative   Retired    Married - 42 years    One son that lives in [place]    2 grandchildren    Social Determinants of Health   Financial Resource Strain: Not on file  Food Insecurity: Not  on file  Transportation Needs: Not on file  Physical Activity: Not on file  Stress: Not on file  Social Connections: Not on file  Intimate Partner Violence: Not on file    Past Surgical History:  Procedure Laterality Date  . achilles tendon     surgical repair bilat  . CATARACT EXTRACTION, BILATERAL    . COLONOSCOPY    . REVERSE SHOULDER ARTHROPLASTY Right 08/19/2018   Procedure: REVERSE RIGHT SHOULDER ARTHROPLASTY;  Surgeon: Justice Britain, MD;  Location: [facility];  Service: Orthopedics;  Laterality: Right;  144min    Family History  Problem Relation Age of Onset  . Heart disease Father   .  Colon cancer Neg Hx   . Esophageal cancer Neg Hx   . Stomach cancer Neg Hx   . Rectal cancer Neg Hx     Allergies  Allergen Reactions  . Plasticized Base [Plastibase] Hives and Rash    Current Outpatient Medications on File Prior to Visit  Medication Sig Dispense Refill  . citalopram (CELEXA) 10 MG tablet TAKE 1 TABLET BY MOUTH EVERY DAY 30 tablet 0  . LORazepam (ATIVAN) 1 MG tablet TAKE 1 TABLET (1 MG TOTAL) BY MOUTH EVERY 8 (EIGHT) HOURS AS NEEDED FOR ANXIETY. 90 tablet 1  . Multiple Vitamin (MULTIVITAMIN) tablet Take 1 tablet by mouth daily.    . Omega-3 Fatty Acids (OMEGA 3 PO) Take 1 capsule by mouth 2 (two) times daily.     Marland Kitchen oxyCODONE-acetaminophen (PERCOCET) 5-325 MG tablet Take 1 tablet by mouth every 4 (four) hours as needed (max 6 q). 20 tablet 0   No current facility-administered medications on file prior to visit.    BP 130/80 (BP Location: Left Arm, Patient Position: Sitting, Cuff Size: Normal)   Pulse 78   Temp 98.3 F (36.8 C) (Oral)   Ht 5\' 7"  (1.702 m)   Wt 182 lb (82.6 kg)   SpO2 98%   BMI 28.51 kg/m       Objective:   Physical Exam Vitals and nursing note reviewed.  Constitutional:      General: He is not in acute distress.    Appearance: Normal appearance. He is well-developed and normal weight.  HENT:     Head: Normocephalic and atraumatic.     Right Ear: Tympanic membrane, ear canal and external ear normal. There is no impacted cerumen.     Left Ear: Tympanic membrane, ear canal and external ear normal. There is no impacted cerumen.     Nose: Nose normal. No congestion or rhinorrhea.     Mouth/Throat:     Mouth: Mucous membranes are moist.     Pharynx: Oropharynx is clear. No oropharyngeal exudate or posterior oropharyngeal erythema.  Eyes:     General:        Right eye: No discharge.        Left eye: No discharge.     Extraocular Movements: Extraocular movements intact.     Conjunctiva/sclera: Conjunctivae normal.     Pupils: Pupils are  equal, round, and reactive to light.  Neck:     Vascular: No carotid bruit.     Trachea: No tracheal deviation.  Cardiovascular:     Rate and Rhythm: Normal rate and regular rhythm.     Pulses: Normal pulses.     Heart sounds: Normal heart sounds. No murmur heard. No friction rub. No gallop.   Pulmonary:     Effort: Pulmonary effort is normal. No respiratory distress.     Breath  sounds: Normal breath sounds. No stridor. No wheezing, rhonchi or rales.  Chest:     Chest wall: No tenderness.  Abdominal:     General: Bowel sounds are normal. There is no distension.     Palpations: Abdomen is soft. There is no mass.     Tenderness: There is no abdominal tenderness. There is no right CVA tenderness, left CVA tenderness, guarding or rebound.     Hernia: No hernia is present.  Musculoskeletal:        General: No swelling, tenderness, deformity or signs of injury. Normal range of motion.     Right lower leg: No edema.     Left lower leg: No edema.  Lymphadenopathy:     Cervical: No cervical adenopathy.  Skin:    General: Skin is warm and dry.     Capillary Refill: Capillary refill takes less than 2 seconds.     Coloration: Skin is not jaundiced or pale.     Findings: No bruising, erythema, lesion or rash.  Neurological:     General: No focal deficit present.     Mental Status: He is alert and oriented to person, place, and time.     Cranial Nerves: No cranial nerve deficit.     Sensory: No sensory deficit.     Motor: No weakness.     Coordination: Coordination normal.     Gait: Gait normal.     Deep Tendon Reflexes: Reflexes normal.  Psychiatric:        Mood and Affect: Mood normal.        Behavior: Behavior normal.        Thought Content: Thought content normal.        Judgment: Judgment normal.       Assessment & Plan:  1. Routine general medical examination at a health care facility - Follow up in one year or sooner if needed  - Continue to eat healthy and exercise  - CBC  with Differential/Platelet; Future - Comprehensive metabolic panel; Future - Lipid panel; Future - TSH; Future  2. Anxiety - well controlled. No change in medications  - citalopram (CELEXA) 10 MG tablet; Take 1 tablet (10 mg total) by mouth daily.  Dispense: 90 tablet; Refill: 1 - LORazepam (ATIVAN) 1 MG tablet; Take 1 tablet (1 mg total) by mouth every 8 (eight) hours as needed for anxiety.  Dispense: 90 tablet; Refill: 1  3. Benign prostatic hyperplasia without lower urinary tract symptoms - Asymptomatic  - Follow up with Urology as directed   Dorothyann Peng, NP

## 2021-01-24 DIAGNOSIS — R311 Benign essential microscopic hematuria: Secondary | ICD-10-CM | POA: Diagnosis not present

## 2021-03-26 ENCOUNTER — Encounter: Payer: Self-pay | Admitting: Adult Health

## 2021-03-26 ENCOUNTER — Ambulatory Visit (INDEPENDENT_AMBULATORY_CARE_PROVIDER_SITE_OTHER): Payer: Medicare Other | Admitting: Adult Health

## 2021-03-26 ENCOUNTER — Other Ambulatory Visit: Payer: Self-pay

## 2021-03-26 VITALS — BP 132/80 | Temp 97.5°F | Ht 67.0 in | Wt 182.4 lb

## 2021-03-26 DIAGNOSIS — R5383 Other fatigue: Secondary | ICD-10-CM

## 2021-03-26 DIAGNOSIS — R350 Frequency of micturition: Secondary | ICD-10-CM

## 2021-03-26 LAB — POCT URINALYSIS DIPSTICK
Bilirubin, UA: NEGATIVE
Blood, UA: NEGATIVE
Glucose, UA: NEGATIVE
Ketones, UA: NEGATIVE
Leukocytes, UA: NEGATIVE
Nitrite, UA: NEGATIVE
Protein, UA: POSITIVE — AB
Spec Grav, UA: 1.015 (ref 1.010–1.025)
Urobilinogen, UA: 0.2 E.U./dL
pH, UA: 6 (ref 5.0–8.0)

## 2021-03-26 MED ORDER — TAMSULOSIN HCL 0.4 MG PO CAPS: 0.4000 mg | ORAL_CAPSULE | Freq: Every day | ORAL | 1 refills | Status: DC

## 2021-03-26 NOTE — Patient Instructions (Signed)
It was great seeing you today   I am going to prescribe you a medication called Flomax to help with your urinary symptoms   I am also going to check some blood work to look for a cause of fatigue   You can get a B12 supplement at the pharmacy to help boost your energy

## 2021-03-26 NOTE — Progress Notes (Signed)
Subjective:    Patient ID: Paul Schwartz, male    DOB: 04-Jul-1941, 80 y.o.   MRN: [id number]  HPI 80 year old male who  has a past medical history of Arthritis, Bilateral shoulder pain, BPH (benign prostatic hyperplasia), GERD (gastroesophageal reflux disease), History of kidney stones, and Vertigo.  He presents to the office today for the complaint of urinary frequency. He first noticed that he was having increased frequency, more so during the day then at night. He will get up 1-2 times a night to urinate. Associated symptoms include incomplete bladder emptying and decreased stream. He denies dysuria, hematuria. Does have a history of BPH  He also reports that for an unknown amount of time he has been feeling fatigued " all the time". He does snore at times but not often ( only when he drinks), denies apnic episodes. Does take a nap very rarely during the day. He used to be able to work in the yard all day but finds that he often becomes tired much earlier. Has no issues with sleeping.   Denies chest pain or shortness of breath and does not feel acutely ill   Takes his Celexa at night    Review of Systems See HPI   Past Medical History:  Diagnosis Date  . Arthritis   . Bilateral shoulder pain   . BPH (benign prostatic hyperplasia)   . GERD (gastroesophageal reflux disease)   . History of kidney stones   . Vertigo    dizziness    Social History   Socioeconomic History  . Marital status: Married    Spouse name: Not on file  . Number of children: Not on file  . Years of education: Not on file  . Highest education level: Not on file  Occupational History  . Not on file  Tobacco Use  . Smoking status: Former Smoker    Types: Cigarettes    Quit date: 04/07/1968    Years since quitting: 53.0  . Smokeless tobacco: Never Used  Vaping Use  . Vaping Use: Never used  Substance and Sexual Activity  . Alcohol use: Yes    Alcohol/week: 4.0 standard drinks    Types: 4  Glasses of wine per week    Comment: weekends  . Drug use: No  . Sexual activity: Yes  Other Topics Concern  . Not on file  Social History Narrative   Retired    Married - 39 years    One son that lives in [place]    2 grandchildren    Social Determinants of Health   Financial Resource Strain: Not on file  Food Insecurity: Not on file  Transportation Needs: Not on file  Physical Activity: Not on file  Stress: Not on file  Social Connections: Not on file  Intimate Partner Violence: Not on file    Past Surgical History:  Procedure Laterality Date  . achilles tendon     surgical repair bilat  . CATARACT EXTRACTION, BILATERAL    . COLONOSCOPY    . REVERSE SHOULDER ARTHROPLASTY Right 08/19/2018   Procedure: REVERSE RIGHT SHOULDER ARTHROPLASTY;  Surgeon: Justice Britain, MD;  Location: [facility];  Service: Orthopedics;  Laterality: Right;  114min    Family History  Problem Relation Age of Onset  . Heart disease Father   . Colon cancer Neg Hx   . Esophageal cancer Neg Hx   . Stomach cancer Neg Hx   . Rectal cancer Neg Hx     Allergies  Allergen Reactions  . Plasticized Base [Plastibase] Hives and Rash    Current Outpatient Medications on File Prior to Visit  Medication Sig Dispense Refill  . citalopram (CELEXA) 10 MG tablet Take 1 tablet (10 mg total) by mouth daily. 90 tablet 1  . LORazepam (ATIVAN) 1 MG tablet Take 1 tablet (1 mg total) by mouth every 8 (eight) hours as needed for anxiety. 90 tablet 1  . Multiple Vitamin (MULTIVITAMIN) tablet Take 1 tablet by mouth daily.    . Omega-3 Fatty Acids (OMEGA 3 PO) Take 1 capsule by mouth 2 (two) times daily.     Marland Kitchen oxyCODONE-acetaminophen (PERCOCET) 5-325 MG tablet Take 1 tablet by mouth every 4 (four) hours as needed (max 6 q). 20 tablet 0   No current facility-administered medications on file prior to visit.    BP 132/80   Temp (!) 97.5 F (36.4 C) (Oral)   Ht 5\' 7"  (1.702 m)   Wt 182 lb 6 oz (82.7 kg)   BMI 28.56  kg/m       Objective:   Physical Exam Vitals and nursing note reviewed.  Constitutional:      Appearance: Normal appearance.  Cardiovascular:     Rate and Rhythm: Normal rate and regular rhythm.     Pulses: Normal pulses.     Heart sounds: Normal heart sounds.  Pulmonary:     Effort: Pulmonary effort is normal.     Breath sounds: Normal breath sounds.  Abdominal:     General: Abdomen is flat.     Palpations: Abdomen is soft.  Musculoskeletal:        General: Normal range of motion.     Right lower leg: No edema.     Left lower leg: No edema.  Skin:    General: Skin is warm and dry.     Capillary Refill: Capillary refill takes less than 2 seconds.  Neurological:     General: No focal deficit present.     Mental Status: He is alert and oriented to person, place, and time.  Psychiatric:        Mood and Affect: Mood normal.        Behavior: Behavior normal.        Thought Content: Thought content normal.        Judgment: Judgment normal.       Assessment & Plan:  1. Urinary frequency  - POCT urinalysis dipstick- Negative  - Likely BPH  - tamsulosin (FLOMAX) 0.4 MG CAPS capsule; Take 1 capsule (0.4 mg total) by mouth daily.  Dispense: 90 capsule; Refill: 1  2. Other fatigue - Unknown cause. Maybe age related. Will look at labs. Do not think necessarily sleep apnea.  - Consider Celexa as cause - CBC with Differential/Platelet; Future - Comprehensive metabolic panel; Future - Vitamin D, 25-hydroxy; Future - Vitamin B12; Future - Iron and TIBC; Future   Dorothyann Peng, NP

## 2021-04-09 ENCOUNTER — Other Ambulatory Visit: Payer: Self-pay

## 2021-04-09 ENCOUNTER — Encounter: Payer: Self-pay | Admitting: Adult Health

## 2021-04-09 ENCOUNTER — Ambulatory Visit (INDEPENDENT_AMBULATORY_CARE_PROVIDER_SITE_OTHER): Payer: Medicare Other | Admitting: Adult Health

## 2021-04-09 VITALS — BP 120/60 | HR 77 | Temp 98.4°F | Ht 67.0 in | Wt 181.0 lb

## 2021-04-09 DIAGNOSIS — R4189 Other symptoms and signs involving cognitive functions and awareness: Secondary | ICD-10-CM | POA: Diagnosis not present

## 2021-04-09 NOTE — Patient Instructions (Signed)
It was great seeing you today   I am going to order an MRI of your brain and refer you over to Neurology for more formal testing

## 2021-04-09 NOTE — Progress Notes (Signed)
Subjective:    Patient ID: Paul Schwartz, male    DOB: [birth year], 80 y.o.   MRN: [id number]  HPI 80 year old male who  has a past medical history of Arthritis, Bilateral shoulder pain, BPH (benign prostatic hyperplasia), GERD (gastroesophageal reflux disease), History of kidney stones, and Vertigo.  He presents to the office today with his wife for concern of memory issues. He feels as though his memory is getting worse and he is often forgetting things. He denies getting lost while driving. He will often miss place things around his home, forget his phone when he goes to the store, and forget what he is supposed to buy when he goes to the store.   He reports that his mother and older brother both had dementia    Review of Systems See HPI   Past Medical History:  Diagnosis Date  . Arthritis   . Bilateral shoulder pain   . BPH (benign prostatic hyperplasia)   . GERD (gastroesophageal reflux disease)   . History of kidney stones   . Vertigo    dizziness    Social History   Socioeconomic History  . Marital status: Married    Spouse name: Not on file  . Number of children: Not on file  . Years of education: Not on file  . Highest education level: Not on file  Occupational History  . Not on file  Tobacco Use  . Smoking status: Former Smoker    Types: Cigarettes    Quit date: 04/07/1968    Years since quitting: 53.0  . Smokeless tobacco: Never Used  Vaping Use  . Vaping Use: Never used  Substance and Sexual Activity  . Alcohol use: Yes    Alcohol/week: 4.0 standard drinks    Types: 4 Glasses of wine per week    Comment: weekends  . Drug use: No  . Sexual activity: Yes  Other Topics Concern  . Not on file  Social History Narrative   Retired    Married - 32 years    One son that lives in [place]    2 grandchildren    Social Determinants of Health   Financial Resource Strain: Not on file  Food Insecurity: Not on file  Transportation Needs: Not on file   Physical Activity: Not on file  Stress: Not on file  Social Connections: Not on file  Intimate Partner Violence: Not on file    Past Surgical History:  Procedure Laterality Date  . achilles tendon     surgical repair bilat  . CATARACT EXTRACTION, BILATERAL    . COLONOSCOPY    . REVERSE SHOULDER ARTHROPLASTY Right 08/19/2018   Procedure: REVERSE RIGHT SHOULDER ARTHROPLASTY;  Surgeon: Justice Britain, MD;  Location: [facility];  Service: Orthopedics;  Laterality: Right;  132min    Family History  Problem Relation Age of Onset  . Heart disease Father   . Colon cancer Neg Hx   . Esophageal cancer Neg Hx   . Stomach cancer Neg Hx   . Rectal cancer Neg Hx     Allergies  Allergen Reactions  . Plasticized Base [Plastibase] Hives and Rash    Current Outpatient Medications on File Prior to Visit  Medication Sig Dispense Refill  . citalopram (CELEXA) 10 MG tablet Take 1 tablet (10 mg total) by mouth daily. 90 tablet 1  . LORazepam (ATIVAN) 1 MG tablet Take 1 tablet (1 mg total) by mouth every 8 (eight) hours as needed for anxiety. 90 tablet  1  . Multiple Vitamin (MULTIVITAMIN) tablet Take 1 tablet by mouth daily.    . Omega-3 Fatty Acids (OMEGA 3 PO) Take 1 capsule by mouth 2 (two) times daily.     . tamsulosin (FLOMAX) 0.4 MG CAPS capsule Take 1 capsule (0.4 mg total) by mouth daily. 90 capsule 1   No current facility-administered medications on file prior to visit.    BP 120/60   Pulse 77   Temp 98.4 F (36.9 C) (Oral)   Ht 5\' 7"  (1.702 m)   Wt 181 lb (82.1 kg)   SpO2 94%   BMI 28.35 kg/m       Objective:   Physical Exam Vitals and nursing note reviewed.  Constitutional:      Appearance: Normal appearance.  Cardiovascular:     Rate and Rhythm: Normal rate and regular rhythm.     Pulses: Normal pulses.     Heart sounds: Normal heart sounds.  Pulmonary:     Effort: Pulmonary effort is normal.     Breath sounds: Normal breath sounds.  Musculoskeletal:        General:  Normal range of motion.  Skin:    General: Skin is warm and dry.     Capillary Refill: Capillary refill takes less than 2 seconds.  Neurological:     Mental Status: He is alert and oriented to person, place, and time.  Psychiatric:        Mood and Affect: Mood normal.        Behavior: Behavior normal.        Thought Content: Thought content normal.        Judgment: Judgment normal.       Assessment & Plan:  1. Cognitive impairment - MMSE 24/30.  - MR Brain Wo Contrast; Future - Ambulatory referral to Neurology   Dorothyann Peng, NP

## 2021-04-16 ENCOUNTER — Encounter: Payer: Self-pay | Admitting: Counselor

## 2021-06-14 DIAGNOSIS — H40013 Open angle with borderline findings, low risk, bilateral: Secondary | ICD-10-CM | POA: Diagnosis not present

## 2021-06-14 DIAGNOSIS — H04123 Dry eye syndrome of bilateral lacrimal glands: Secondary | ICD-10-CM | POA: Diagnosis not present

## 2021-07-01 ENCOUNTER — Other Ambulatory Visit: Payer: Self-pay

## 2021-07-02 ENCOUNTER — Encounter: Payer: Self-pay | Admitting: Adult Health

## 2021-07-02 ENCOUNTER — Ambulatory Visit (INDEPENDENT_AMBULATORY_CARE_PROVIDER_SITE_OTHER): Payer: Medicare Other | Admitting: Adult Health

## 2021-07-02 VITALS — BP 110/70 | HR 104 | Temp 98.2°F | Ht 67.0 in | Wt 183.0 lb

## 2021-07-02 DIAGNOSIS — E860 Dehydration: Secondary | ICD-10-CM | POA: Diagnosis not present

## 2021-07-02 LAB — POCT URINALYSIS DIPSTICK
Bilirubin, UA: NEGATIVE
Glucose, UA: NEGATIVE
Ketones, UA: NEGATIVE
Leukocytes, UA: NEGATIVE
Nitrite, UA: NEGATIVE
Protein, UA: POSITIVE — AB
Spec Grav, UA: 1.025 (ref 1.010–1.025)
Urobilinogen, UA: 0.2 E.U./dL
pH, UA: 6 (ref 5.0–8.0)

## 2021-07-02 NOTE — Progress Notes (Signed)
Subjective:    Patient ID: Paul Schwartz, male    DOB: 03-08-1941, 80 y.o.   MRN: [id number]  HPI 80 year old male who  has a past medical history of Arthritis, Bilateral shoulder pain, BPH (benign prostatic hyperplasia), GERD (gastroesophageal reflux disease), History of kidney stones, and Vertigo.  He presents to the office today for an acute issue. His symptoms started an unknown amount of time ago. He reports that his urine is straw colored. He denies dysuria, frequency, urgency, low back pain, pelvic pain.  Has history of kidney stones and hematuria. Has been evaluated in the past by urology and is seen by urology yearly. He is not drinking a lot of water throughout the day and enjoys working outside during the day   Review of Systems See HPI   Past Medical History:  Diagnosis Date   Arthritis    Bilateral shoulder pain    BPH (benign prostatic hyperplasia)    GERD (gastroesophageal reflux disease)    History of kidney stones    Vertigo    dizziness    Social History   Socioeconomic History   Marital status: Married    Spouse name: Not on file   Number of children: Not on file   Years of education: Not on file   Highest education level: Not on file  Occupational History   Not on file  Tobacco Use   Smoking status: Former    Types: Cigarettes    Quit date: 04/07/1968    Years since quitting: 53.2   Smokeless tobacco: Never  Vaping Use   Vaping Use: Never used  Substance and Sexual Activity   Alcohol use: Yes    Alcohol/week: 4.0 standard drinks    Types: 4 Glasses of wine per week    Comment: weekends   Drug use: No   Sexual activity: Yes  Other Topics Concern   Not on file  Social History Narrative   Retired    Married - 16 years    One son that lives in [place]    2 grandchildren    Social Determinants of Health   Financial Resource Strain: Not on file  Food Insecurity: Not on file  Transportation Needs: Not on file  Physical Activity: Not on  file  Stress: Not on file  Social Connections: Not on file  Intimate Partner Violence: Not on file    Past Surgical History:  Procedure Laterality Date   achilles tendon     surgical repair bilat   CATARACT EXTRACTION, BILATERAL     COLONOSCOPY     REVERSE SHOULDER ARTHROPLASTY Right 08/19/2018   Procedure: REVERSE RIGHT SHOULDER ARTHROPLASTY;  Surgeon: Justice Britain, MD;  Location: [facility];  Service: Orthopedics;  Laterality: Right;  131mn    Family History  Problem Relation Age of Onset   Heart disease Father    Colon cancer Neg Hx    Esophageal cancer Neg Hx    Stomach cancer Neg Hx    Rectal cancer Neg Hx     Allergies  Allergen Reactions   Plasticized Base [Plastibase] Hives and Rash    Current Outpatient Medications on File Prior to Visit  Medication Sig Dispense Refill   citalopram (CELEXA) 10 MG tablet Take 1 tablet (10 mg total) by mouth daily. 90 tablet 1   LORazepam (ATIVAN) 1 MG tablet Take 1 tablet (1 mg total) by mouth every 8 (eight) hours as needed for anxiety. 90 tablet 1   Multiple Vitamin (MULTIVITAMIN)  tablet Take 1 tablet by mouth daily.     Omega-3 Fatty Acids (OMEGA 3 PO) Take 1 capsule by mouth 2 (two) times daily.      tamsulosin (FLOMAX) 0.4 MG CAPS capsule Take 1 capsule (0.4 mg total) by mouth daily. 90 capsule 1   No current facility-administered medications on file prior to visit.    BP 110/70   Pulse (!) 104   Temp 98.2 F (36.8 C) (Oral)   Ht '5\' 7"'$  (1.702 m)   Wt 183 lb (83 kg)   SpO2 98%   BMI 28.66 kg/m       Objective:   Physical Exam Vitals and nursing note reviewed.  Constitutional:      Appearance: Normal appearance.  Cardiovascular:     Rate and Rhythm: Normal rate and regular rhythm.     Pulses: Normal pulses.     Heart sounds: Normal heart sounds.  Pulmonary:     Effort: Pulmonary effort is normal.     Breath sounds: Normal breath sounds.  Musculoskeletal:        General: Normal range of motion.  Skin:     General: Skin is warm and dry.     Capillary Refill: Capillary refill takes less than 2 seconds.  Neurological:     General: No focal deficit present.     Mental Status: He is alert and oriented to person, place, and time.  Psychiatric:        Mood and Affect: Mood normal.        Behavior: Behavior normal.        Thought Content: Thought content normal.        Judgment: Judgment normal.      Assessment & Plan:  1. Dehydration - Concentrated urine  and +3 heme.  - Encouraged to drink more water thorughout the day  - Work outside in the early morning or early morning.  - POC Urinalysis Dipstick  Dorothyann Peng, NP

## 2021-07-11 ENCOUNTER — Other Ambulatory Visit: Payer: Medicare Other

## 2021-07-24 ENCOUNTER — Ambulatory Visit (INDEPENDENT_AMBULATORY_CARE_PROVIDER_SITE_OTHER): Payer: Medicare Other | Admitting: Counselor

## 2021-07-24 ENCOUNTER — Encounter: Payer: Self-pay | Admitting: Counselor

## 2021-07-24 ENCOUNTER — Other Ambulatory Visit: Payer: Self-pay

## 2021-07-24 ENCOUNTER — Ambulatory Visit: Payer: Medicare Other | Admitting: Psychology

## 2021-07-24 DIAGNOSIS — R413 Other amnesia: Secondary | ICD-10-CM | POA: Diagnosis not present

## 2021-07-24 DIAGNOSIS — F09 Unspecified mental disorder due to known physiological condition: Secondary | ICD-10-CM

## 2021-07-24 NOTE — Progress Notes (Signed)
[facility]  Patient Name: Paul Schwartz MRN: [id number] Date of Birth: 20-Apr-1941 Age: 80 y.o. Education: 12 years  Referral Circumstances and Background Information  [name] is a 80 y.o., right-hand dominant, married man with a history of HTN, memory loss, presenting for evaluation on referral from Dorothyann Peng, NP with [facility] My thanks to Mr. Nafziger for his helpful notes, I see patient obtained MMSE of 24/30 on 03/26/2021.    On interview, the patient acknowledges that he has problems with memory and thinking although he is not very concerned. His wife is a bit concerned, however, since about January of this year. She said that he asked her what time the appointment was 3 times in the course of 20 minutes today. He will repeatedly ask her the same question and forget things. He gets distracted easily and doesn't seem as on his game as he was in the past. They are denying any notable difficulties with orientation to time. He does have word finding problems sometimes. The patient is a native of [place] and came here in the 1970s, and cannot read or write English, so his wife has done most things.   With respect to functioning, his wife does most things. She thinks that allows him to cover up his issues. Nevertheless, He is driving and has no difficulties doing so. His wife is not concerned about his driving. He doesn't manage finances, his wife does, but he still handles small sums of money to buy things at the store and has no difficulties with that. He is able to go grocery shopping and use stores as needed. The patient does not have many medications. He is very active and will get up early, at 5:00am, mow the lawn, go on a walk etc. The patient previously was taking Celexa although he denied any problems with that and he is not taking it now. His energy is good. He gets a good 7 or 8 hours of sleep and has no difficulties  falling asleep or staying asleep. He tends to take a brief nap in the afternoon. He was going to the gym every day, he stopped related to [place]. He cooks, he is denying any issues but his wife is saying that sometimes he does forget things. 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 He has arthritis and shoulder problems s/p arthroplasty. He has no head injuries with loss of consciousness.   MRI has been ordered but not yet obtained.   Current Outpatient Medications  Medication Sig Dispense Refill   citalopram (CELEXA) 10 MG tablet Take 1 tablet (10 mg total) by mouth daily. 90 tablet 1   LORazepam (ATIVAN) 1 MG tablet Take 1 tablet (1 mg total) by mouth every 8 (eight) hours as needed for anxiety. 90 tablet 1  Multiple Vitamin (MULTIVITAMIN) tablet Take 1 tablet by mouth daily.     Omega-3 Fatty Acids (OMEGA 3 PO) Take 1 capsule by mouth 2 (two) times daily.      tamsulosin (FLOMAX) 0.4 MG CAPS capsule Take 1 capsule (0.4 mg total) by mouth daily. 90 capsule 1   No current facility-administered medications for this visit.    Family History  Problem Relation Age of Onset   Heart disease Father    Colon cancer Neg Hx    Esophageal cancer Neg Hx    Stomach cancer Neg Hx    Rectal cancer Neg Hx    There is a family  history of dementia. The patient has four brothers, one of whom died around 50 from dementia. His mother also had dementia, although she was almost 80. There is no  family history of psychiatric illness.  Psychosocial History  Developmental, Educational and Employment History: The patient is a native of [place], he did all his school in [place] in [place]. He reported that he was not particularly academically inclined and it sounds like in general, they feel that the educational system there was not very good. He went to private school. He came to the [place] in 1969, could not stay, and then eventually came back in 1972. 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 They weren't sure when he retired, maybe a decade ago.   Psychiatric History: The patient has no history of significant psychiatric difficulties. He denied much in the way of affective issues previously and stated that he wasn't sure why he was taking Celexa.   Substance Use History: The patient does not drink regularly, occasionally. He quit many years ago.   Relationship History and Living Cimcumstances: The patient and his wife have a son who lives in [place]. They denied that he has noticed much in the way of changes, maybe "a little bit." He talks to his son every day. They visited recently. They have several grandchildren  Mental Status and Behavioral Observations  Sensorium/Arousal: The patient's level of arousal was awake and alert. Hearing and vision were adequate for testing purposes. Orientation: The patient was generally oriented Appearance: Dressed in appropriate, casual clothing Behavior: Pleasant, appropriate, animated gentleman. Noted to have great difficulty with many of the assessment tasks.  Speech/language: Normal in rate, rhythm, volume and prosody. Seemed to have a good grasp of the [place] language, qualitatively speaking Gait/Posture:  Steady, narrow based Movement: No rest tremor, bradykinesia, hypokinesia noted Social Comportment: Pleasant, appropriate, in fact somewhat gregarious Mood: The patient's self-reported mood was 'good!' Affect: Euthymic Thought process/content: Logical, linear, and goal directed. Thought content was appropriate. Safety: No safety concerns identified in this euthymic patient. Insight: Questionable, patient isn't concerned  Test Procedures  Wide Range Achievement Test - 4             Word Reading Doy Mince' Intellectual Screening Test  Repeatable Battery for the Assessment of Neuropsychological Status (Form A) Semantic Fluency (Animals) Trail Making Test A & B Modified [place] Card Sorting Test Geriatric Depression Scale - Short Form Quick Dementia Rating System (completed by wife, Marsala)  Plan  TAYTUM LAIL was seen for a psychiatric diagnostic evaluation and neuropsychological testing. He is a very pleasant, 80 year old [place] gentleman seen on referral from Dorothyann Peng, NP for neuropsychological evaluation of memory loss. His wife is concerned about his memory and repeating is the main things that  she has noticed. He also does not seem as focused as he was in the past. Today, he is quite animated and presents well but seemed to have a great deal of difficulty on cognitive testing, suggesting issues. I informed him of the limits of neuropsychological testing for non-native English speaking individuals and he wished to proceed. I modified the battery as per my best professional judgment to try to minimize any cultural confound. Full and complete note with impressions, recommendations, and interpretation of test data to follow.   Viviano Simas Nicole Kindred, PsyD, [facility]psychologist  Informed Consent  Risks and benefits of the evaluation were discussed with the patient prior to all testing procedures. I conducted a clinical interview   with [name] and Milana Kidney, B.S. (Technician) administered additional test procedures. The patient was able to tolerate the testing procedures and the patient (and/or family if applicable) is likely to benefit from further follow up to receive the diagnosis and treatment recommendations, which will be rendered at the next encounter.

## 2021-07-24 NOTE — Progress Notes (Signed)
   Psychometrist Note   Cognitive testing was administered to Paul Schwartz by Milana Kidney, B.S. (Technician) under the supervision of Alphonzo Severance, Psy.D., ABN. [name] was able to tolerate all test procedures. Dr. Nicole Kindred met with the patient as needed to manage any emotional reactions to the testing procedures. Rest breaks were offered.    The battery of tests administered was selected by Dr. Nicole Kindred with consideration to the patient's current level of functioning, the nature of his symptoms, emotional and behavioral responses during the interview, level of literacy, observed level of motivation/effort, and the nature of the referral question. This battery was communicated to the psychometrist. Communication between Dr. Nicole Kindred and the psychometrist was ongoing throughout the evaluation and Dr. Nicole Kindred was immediately accessible at all times. Dr. Nicole Kindred provided supervision to the technician on the date of this service, to the extent necessary to assure the quality of all services provided.    Mr. Swor will return in approximately one week for an interactive feedback session with Dr. Nicole Kindred, at which time test performance, clinical impressions, and treatment recommendations will be reviewed in detail. The patient understands he can contact our office should he require our assistance before this time.   A total of 65 minutes of billable time were spent with [name] by the technician, including test administration and scoring time. Billing for these services is reflected in Dr. Les Pou note.   This note reflects time spent with the psychometrician and does not include test scores, clinical history, or any interpretations made by Dr. Nicole Kindred. The full report will follow in a separate note.

## 2021-07-25 NOTE — Progress Notes (Signed)
     [facility]  Patient Name: FIRAS REEM MRN: [id number] Date of Birth: 1941/06/07 Age: 80 y.o. Education: 12 years  Measurement properties of test scores: IQ, Index, and Standard Scores (SS): Mean = 100; Standard Deviation = 15 Scaled Scores (Ss): Mean = 10; Standard Deviation = 3 Z scores (Z): Mean = 0; Standard Deviation = 1 T scores (T); Mean = 50; Standard Deviation = 10  TEST SCORES:    Note: This summary of test scores accompanies the interpretive report and should not be interpreted by unqualified individuals or in isolation without reference to the report. Test scores are relative to age, gender, and educational history as available and appropriate.   Expected Functioning        Wide Range Achievement Test (Word Reading): Standard/Scaled Score Percentile       Word Reading 75 5      Reynolds Intellectual Screening Test Standard/T-score Percentile      Guess What 41 18      Odd Item Out 42 21  RIST Index 88 21      Cognitive Testing        RBANS, Form : Standard/Scaled Score Percentile  Total Score 52 <1  Immediate Memory 40 <1      List Learning 1 <1      Story Memory 1 <1  Visuospatial/Constructional 66 1      Figure Copy   (13) 4 2      Judgment of Line Orientation   (11) --- 3-9  Language 92 30      Picture Naming --- 26-50      Semantic Fluency 6 9  Attention 56 <1      Digit Span 5 5      Coding 2 <1  Delayed Memory 48 <1      List Recall   (0) --- <2      List Recognition   (13) --- <2      Story Recall   (0) 1 <1      Figure Recall   (3) 4 2      Neuropsychological Assessment Battery (Language Module): T-score Percentile      Naming   (18) 21 <1      Verbal Fluency: T-score Percentile      Semantic Fluency (Animals) 41 18      Trail Making Test: T-Score Percentile      Part A 40 16      Part B 25 1      Clock Drawing Raw Score Descriptor      Command 3 Severe Impairment      Rating Scales         Quick Dementia Rating System Raw Score Descriptor      Sum of Boxes 1 MCI      Total Score 1.5 MCI  Geriatric Depression Scale - Short Form 2 Negative    Demichael Traum V. Nicole Kindred PsyD, [place] Clinical Neuropsychologist

## 2021-07-29 NOTE — Progress Notes (Signed)
[facility]  Patient Name: Paul Schwartz MRN: [id number] Date of Birth: [birth year] Age: 80 y.o. Education: 21 years  Clinical Impressions  Paul Schwartz is a 80 y.o., right-hand dominant, married man with a history of problems with memory and thinking since January, 2022. He appreciates some symptoms but is not very concerned, his wife is the one who pushed for the evaluation. She notices forgetting of information, including rapid forgetting and frequent repeating. He gets distracted easily. Complicating assessment, he is a native of [place], cannot read/write well in [place], and [place] is a second language for him. On neuropsychological tests, there is evidence of extremely low overall cognitive performance including on measures viewed as having minimal cultural and/or language task demands. He demonstrated very deficient recall of information, including visual information, and additional low scores that are difficult to attribute to anything other than significant cognitive impairment. His wife characterizes him as functioning barely at an MCI level. He has no obvious functional impairment day-to-day, although she admits that she does essentially everything for him from an iADL perspective.   The patient is demonstrating test data that suggests substantial cognitive impairment, even when interpreted very leniently in lieu of the fact that these tests are biased for non-native English speakers. Giving him the benefit of the doubt and placing a heavy weight on his wife's impression of his real world functioning, a diagnosis of MCI seems reasonable, although I think he likely has reached a dementia level of function and problems are being overlooked. His demographics, memory storage problems, and other aspects of his test performance are concerning for a developing Alzheimer's clinical syndrome. A vascular contributor is also possible. Would recommend trial  of anti-dementia medication. I will discuss with them healthy lifestyle changes and what to look out for should the condition progress. I will also encourage them to obtain brain MRI, which may be helpful for further diagnostic support and to make sure nothing is being missed.   Diagnostic Impressions: Amnestic mild cognitive impairment  Recommendations to be discussed with patient  Your performance and presentation were consistent with substantial cognitive impairment. That is to say, you achieved very low scores in most areas assessed. I would like to be transparent with you about the fact that these measures are not entirely fair tests of your ability given your status as a non-native [place] speaker. Even individuals with very good faculty in [place] tend to show differences in performance as compared to native speakers if English is not her first language. Nevertheless, these findings are sufficiently abnormal that I am confident they cannot be explained by cultural or language compound, and you did have low scores on measures that have minimal language components. 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  The major difference between mild cognitive impairment (MCI) and dementia is in severity and potential prognosis. Once someone reaches a level of severity adequate to be diagnosed with a dementia, there is usually progression over time, though this may be years. On the other hand, mild cognitive impairment, while a significant risk for dementia in future, does not always progress to dementia, and in some instances stays the same or can even revert to normal. It is important to realize that if MCI is due to underlying Alzheimer's disease, it will most likely progress to dementia eventually. The rate of conversion to Alzheimer's dementia from amnestic MCI is about 81% per year versus the general population risk of conversion of 2% per year.  Were I to simply look  at your test scores, I would think they are more consistent with dementia than they are with mild cognitive impairment, although we cannot diagnose dementia based on test scores alone. You and your wife are reporting that there is nothing that you used to do that you cannot do now, and you noticed only minimal changes in your day-to-day real world functioning. This makes mild cognitive impairment the best diagnosis, because by definition dementia involves a decline from a previously higher level that appreciably impacts your functioning in one or more major life areas. I am concerned that your MCI is due to Alzheimer's disease, the most common cause of cognitive decline in individuals your age, and that means there will likely be progression over time.  First, there are medications that can be helpful. While these are not always used in the mild cognitive impairment stage of illness, I think it would be reasonable for Paul Schwartz to consider an antidementia medication for you given your particular circumstances.  Second, there are other things you can do to reduce the risk of progression, including dietary and lifestyle changes.  Exercise is one of the best medicines for promoting health and maintaining cognitive fitness at all stages in life. Exercise probably has the largest documented effect on brain health and performance of any lifestyle intervention. Studies have shown that even previously sedentary individuals who start exercising as late as age 81 show a significant survival benefit as compared to their non-exercising peers. In the [place], the current guidelines are for 30 minutes of moderate exercise per day, but increasing your activity level less than that may also be helpful. You do not have to get your 30 minutes of exercise in one shot and exercising for short periods of time spread throughout the day can be helpful. Go for several walks, learn to dance, or do something else you enjoy  that gets your body moving. Of course, if you have an underlying medical condition or there is any question about whether it is safe for you to exercise, you should consult a medical treatment provider prior to beginning exercise.   There is now good quality evidence from at least one large scale study that a modified mediterranean diet may help slow cognitive decline. This is known as the "MIND" diet. The Mind diet is not so much a specific diet as it is a set of recommendations for things that you should and should not eat.   Foods that are ENCOURAGED on the MIND Diet:  Green, leafy vegetables: Aim for six or more servings per week. This includes kale, spinach, cooked greens and salads.  All other vegetables: Try to eat another vegetable in addition to the green leafy vegetables at least once a day. It is best to choose non-starchy vegetables because they have a lot of nutrients with a low number of calories.  Berries: Eat berries at least twice a week. There is a plethora of research on strawberries, and other berries such as blueberries, raspberries and blackberries have also been found to have antioxidant and brain health benefits.  Nuts: Try to get five servings of nuts or more each week. The creators of the Butterfield don't specify what kind of nuts to consume, but it is probably best to vary the type of nuts you eat to obtain a variety of nutrients. Peanuts are a legume and do not fall into this category.  Olive oil: Use olive oil as your main cooking  oil. There may be other heart-healthy alternatives such as algae oil, though there is not yet sufficient research upon which to base a formal recommendation.  Whole grains: Aim for at least three servings daily. Choose minimally processed grains like oatmeal, quinoa, brown rice, whole-wheat pasta and 100% whole-wheat bread.  Fish: Eat fish at least once a week. It is best to choose fatty fish like salmon, sardines, trout, tuna and mackerel for their  high amounts of omega-3 fatty acids.  Beans: Include beans in at least four meals every week. This includes all beans, lentils and soybeans.  Poultry: Try to eat chicken or [place] at least twice a week. Note that fried chicken is not encouraged on the MIND diet.  Wine: Aim for no more than one glass of alcohol daily. Both red and white wine may benefit the brain. However, much research has focused on the red wine compound resveratrol, which may help protect against Alzheimer's disease.  Foods that are DISCOURAGED on the MIND Diet: Butter and margarine: Try to eat less than 1 tablespoon (about 14 grams) daily. Instead, try using olive oil as your primary cooking fat, and dipping your bread in olive oil with herbs.  Cheese: The MIND diet recommends limiting your cheese consumption to less than once per week.  Red meat: Aim for no more than three servings each week. This includes all beef, pork, lamb and products made from these meats.  Maceo Pro food: The MIND diet highly discourages fried food, especially the kind from fast-food restaurants. Limit your consumption to less than once per week.  Pastries and sweets: This includes most of the processed junk food and desserts you can think of. Ice cream, cookies, brownies, snack cakes, donuts, candy and more. Try to limit these to no more than four times a week.  It sounds as though you are active. I often recommend that individuals set up a routine that supports desired behaviors, including exercise, meals, cognitively stimulating activities, and other things that you want to do throughout the day.  ou may not need this because it sounds as though you already have a good routine.   Test Findings  Test scores are summarized in additional documentation associated with this encounter. Test scores are relative to age, gender, and educational history as available and appropriate. Cognitive test findings must be interpreted with caution in this individual with  limited reading/writing abilities in [place], as these tests are normed and designed for native [employer] English-speaking samples. Nevertheless, there were no concerns about performance validity as all findings fell within normal expectations.   General Intellectual Functioning/Achievement:  Performance on single word reading was unusually low, which is weak but certainly does not suggest complete illiteracy (patient's wife said he cannot read or write in [place]). Performance on the RIST index was low average, with comparable low average range scores on the visually and verbally mediated subtests. While likely not valid as an indicator of overall ability in this individual, the RIST index does suggest reasonable overall ability from a test taking perspective, and was used as an anchoring point for his cognitive test data.  Attention and Processing Efficiency: Performance on measures of attention/processing efficiency was extremely low. Digit repetition was unusually low and timed number-symbol coding was extremely low.  Language: Performance on language measures was likely undermined at least to some extent by the patient's non-native English speaking status. Nevertheless, his visual object confrontation naming was extremely low both when asked to respond in [place] and when asked  respond in [place].  Generation of animals in 1 minute was low average. Generation of fruits and vegetables in 1 minute was unusually low.  Visuospatial Function: Performance was extremely low on visuospatial/constructional measures considering overall performance. His figure copy was sloppy, drawn in a piecemeal fashion, and betrayed poor planning. Judgment of angular line orientations was unusually low and his specific scores suggest that he did understand this measure, he just had a hard time with the test demands. These low scores are particularly telling given that they have minimal language  requirements.  Learning and Memory: Performance on measures of learning and memory reflected significant advanced memory storage problems, with virtually no retention of information across time.  In the verbal realm, his word list learning and short story learning were extremely low. Delayed recall of the words from the list was extremely low with comparable extremely low recognition (near chance levels). Delayed recall for the short story was extremely low (did not recall any information).  In the visual realm, his delayed recall of a modestly complex geometric figure was extremely low.  Executive Functions: Paul Schwartz ability to engage in executive measures was limited, so the modified [place] card sorting test could not be administered. The Trail Making Test B had to be discontinued after several minutes, because he had such difficulty completing it, generating an extremely low score. His clock drawing was notable for missing numbers and no hands, consistent with "severe impairment."  Rating Scale(s): Paul Schwartz screened negative for the presence of depression on self rating. He was characterized as functioning just at an MCI level by his wife, with mild memory loss, slight impairment in judgment and problem-solving, and minor difficulties with attention/concentration.  Viviano Simas Nicole Kindred, PsyD, ABN Clinical Neuropsychologist  Coding and Compliance  Billing below reflects technician time, my direct face-to-face time with the patient, time spent in test administration, and time spent in professional activities including but not limited to: neuropsychological test interpretation, integration of neuropsychological test data with clinical history, report preparation, treatment planning, care coordination, and review of diagnostically pertinent medical history or studies.   Services associated with this encounter: Clinical Interview [phone]) plus 110 minutes ([id number]; Neuropsychological  Evaluation by Professional)  65 minutes ([id number]; Neuropsychological Testing by Technician)

## 2021-07-31 ENCOUNTER — Telehealth: Payer: Self-pay

## 2021-07-31 ENCOUNTER — Other Ambulatory Visit: Payer: Self-pay

## 2021-07-31 ENCOUNTER — Ambulatory Visit (INDEPENDENT_AMBULATORY_CARE_PROVIDER_SITE_OTHER): Payer: Medicare Other | Admitting: Counselor

## 2021-07-31 ENCOUNTER — Encounter: Payer: Self-pay | Admitting: Counselor

## 2021-07-31 DIAGNOSIS — G3184 Mild cognitive impairment, so stated: Secondary | ICD-10-CM | POA: Diagnosis not present

## 2021-07-31 NOTE — Telephone Encounter (Signed)
LVM instructions for pt to call back to schedule appt to discuss recent neurology consult notes.

## 2021-07-31 NOTE — Patient Instructions (Signed)
Paul performance and presentation were consistent with substantial cognitive impairment. That is to say, Paul Schwartz achieved very low scores in most areas assessed. I would like to be transparent with Paul Schwartz about the fact that these measures are not entirely fair tests of Paul ability given Paul status as a non-native [place] speaker. Even individuals with very good faculty in [place] tend to show differences in performance as compared to native speakers if English is not her first language. Nevertheless, these findings are sufficiently abnormal that I am confident they cannot be explained by cultural or language compound, and Paul Schwartz did have low scores on measures that have minimal language components. 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  The major difference between mild cognitive impairment (MCI) and dementia is in severity and potential prognosis. Once someone reaches a level of severity adequate to be diagnosed with a dementia, there is usually progression over time, though this may be years. On the other hand, mild cognitive impairment, while a significant risk for dementia in future, does not always progress to dementia, and in some instances stays the same or can even revert to normal. It is important to realize that if MCI is due to underlying Alzheimer's disease, it will most likely progress to dementia eventually. The rate of conversion to Alzheimer's dementia from amnestic MCI is about 15% per year versus the general population risk of conversion of 2% per year.    Were I to simply look at Paul test scores, I would think they are more consistent with dementia than they are with mild cognitive impairment, although we cannot diagnose dementia based on test scores alone. Paul Schwartz and Paul Schwartz are reporting that there is nothing that Paul Schwartz used to do that Paul Schwartz cannot do now, and Paul Schwartz noticed only minimal changes in Paul day-to-day real world functioning. This makes mild cognitive  impairment the best diagnosis, because by definition dementia involves a decline from a previously higher level that appreciably impacts Paul functioning in one or more major life areas. I am concerned that Paul MCI is due to Alzheimer's disease, the most common cause of cognitive decline in individuals Paul age, and that means there will likely be progression over time.  First, there are medications that can be helpful. While these are not always used in the mild cognitive impairment stage of illness, I think it would be reasonable for Paul Schwartz to consider an antidementia medication for Paul Schwartz given Paul particular circumstances.  Second, there are other things Paul Schwartz can do to reduce the risk of progression, including dietary and lifestyle changes.  Exercise is one of the best medicines for promoting health and maintaining cognitive fitness at all stages in life. Exercise probably has the largest documented effect on brain health and performance of any lifestyle intervention. Studies have shown that even previously sedentary individuals who start exercising as late as age 43 show a significant survival benefit as compared to their non-exercising peers. In the [place], the current guidelines are for 30 minutes of moderate exercise per day, but increasing Paul activity level less than that may also be helpful. You do not have to get Paul 30 minutes of exercise in one shot and exercising for short periods of time spread throughout the day can be helpful. Go for several walks, learn to dance, or do something else Paul Schwartz enjoy that gets Paul body moving. Of course, if Paul Schwartz have an underlying medical condition or there is any question about whether it is safe for  Paul Schwartz to exercise, Paul Schwartz should consult a medical treatment provider prior to beginning exercise.    There is now good quality evidence from at least one large scale study that a modified mediterranean diet may help slow cognitive decline. This is known as  the "MIND" diet. The Mind diet is not so much a specific diet as it is a set of recommendations for things that Paul Schwartz should and should not eat.   Foods that are ENCOURAGED on the MIND Diet:  Green, leafy vegetables: Aim for six or more servings per week. This includes kale, spinach, cooked greens and salads.  All other vegetables: Try to eat another vegetable in addition to the green leafy vegetables at least once a day. It is best to choose non-starchy vegetables because they have a lot of nutrients with a low number of calories.  Berries: Eat berries at least twice a week. There is a plethora of research on strawberries, and other berries such as blueberries, raspberries and blackberries have also been found to have antioxidant and brain health benefits.  Nuts: Try to get five servings of nuts or more each week. The creators of the [place] don't specify what kind of nuts to consume, but it is probably best to vary the type of nuts Paul Schwartz eat to obtain a variety of nutrients. Peanuts are a legume and do not fall into this category.  Olive oil: Use olive oil as Paul main cooking oil. There may be other heart-healthy alternatives such as algae oil, though there is not yet sufficient research upon which to base a formal recommendation.  Whole grains: Aim for at least three servings daily. Choose minimally processed grains like oatmeal, quinoa, brown rice, whole-wheat pasta and 100% whole-wheat bread.  Fish: Eat fish at least once a week. It is best to choose fatty fish like salmon, sardines, trout, tuna and mackerel for their high amounts of omega-3 fatty acids.  Beans: Include beans in at least four meals every week. This includes all beans, lentils and soybeans.  Poultry: Try to eat chicken or [place] at least twice a week. Note that fried chicken is not encouraged on the MIND diet.  Wine: Aim for no more than one glass of alcohol daily. Both red and white wine may benefit the brain. However, much research  has focused on the red wine compound resveratrol, which may help protect against Alzheimer's disease.  Foods that are DISCOURAGED on the MIND Diet: Butter and margarine: Try to eat less than 1 tablespoon (about 14 grams) daily. Instead, try using olive oil as Paul primary cooking fat, and dipping Paul bread in olive oil with herbs.  Cheese: The MIND diet recommends limiting Paul cheese consumption to less than once per week.  Red meat: Aim for no more than three servings each week. This includes all beef, pork, lamb and products made from these meats.  [place] food: The MIND diet highly discourages fried food, especially the kind from fast-food restaurants. Limit Paul consumption to less than once per week.  Pastries and sweets: This includes most of the processed junk food and desserts Paul Schwartz can think of. Ice cream, cookies, brownies, snack cakes, donuts, candy and more. Try to limit these to no more than four times a week.   It sounds as though Paul Schwartz are active. I often recommend that individuals set up a routine that supports desired behaviors, including exercise, meals, cognitively stimulating activities, and other things that Paul Schwartz want to do throughout the day.  ou  may not need this because it sounds as though Paul Schwartz already have a good routine.

## 2021-07-31 NOTE — Progress Notes (Signed)
   NEUROPSYCHOLOGY FEEDBACK NOTE [facility]  Feedback Note: I met with Paul Schwartz to review the findings resulting from his neuropsychological evaluation. Since the last appointment, he has been about the same. Time was spent reviewing the impressions and recommendations that are detailed in the evaluation report. 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 I counseled him about the importance of diet, exercise, routine, and also encouraged them to follow through with the MRI. Discussed that medications are usually used in the dementia stage of illness but may sometimes be considered for individuals with more mild dysfunction depending on their particular circumstances, which they can discuss with Paul Schwartz. I took time to explain the findings and answer all the patient's questions. I encouraged Paul Schwartz to contact me should he have any further questions or if further follow up is desired.   Current Medications and Medical History   Current Outpatient Medications  Medication Sig Dispense Refill   citalopram (CELEXA) 10 MG tablet Take 1 tablet (10 mg total) by mouth daily. 90 tablet 1   LORazepam (ATIVAN) 1 MG tablet Take 1 tablet (1 mg total) by mouth every 8 (eight) hours as needed for anxiety. 90 tablet 1   Multiple Vitamin (MULTIVITAMIN) tablet Take 1 tablet by mouth daily.     Omega-3 Fatty Acids (OMEGA 3 PO) Take 1 capsule by mouth 2 (two) times daily.      tamsulosin (FLOMAX) 0.4 MG CAPS capsule Take 1 capsule (0.4 mg total) by mouth daily. 90 capsule 1   No current facility-administered medications for this visit.    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 Speech was normal in rate, rhythm, volume, and prosody. Self-reported mood was "good" and affect was bright and euthymic. Thought process was logical and goal oriented and thought content was appropriate to the topics discussed. There were no safety concerns identified at today's encounter, such as thoughts of harming self or others.   Plan  Feedback provided regarding the patient's neuropsychological evaluation. Encouraged him to follow up with Paul Schwartz's office for MRI and further management such as medication if deemed appropriate. Paul Schwartz was encouraged to contact me if any questions arise or if further follow up is desired.    V. , PsyD, ABN Clinical Neuropsychologist  Service(s) Provided at This Encounter: 32 minutes (90847; Conjoint therapy with patient present)    

## 2021-08-01 NOTE — Progress Notes (Signed)
FYI

## 2021-08-01 NOTE — Addendum Note (Signed)
Addended by: Gwenyth Ober R on: 08/01/2021 01:43 PM   Modules accepted: Orders

## 2021-08-02 ENCOUNTER — Other Ambulatory Visit: Payer: Self-pay | Admitting: Adult Health

## 2021-08-02 DIAGNOSIS — F419 Anxiety disorder, unspecified: Secondary | ICD-10-CM

## 2021-08-17 ENCOUNTER — Other Ambulatory Visit: Payer: Medicare Other

## 2021-08-17 ENCOUNTER — Ambulatory Visit
Admission: RE | Admit: 2021-08-17 | Discharge: 2021-08-17 | Disposition: A | Discharge status: Home / Self Care | Payer: Medicare Other | Source: Ambulatory Visit | Attending: Adult Health | Admitting: Adult Health

## 2021-08-17 DIAGNOSIS — G3184 Mild cognitive impairment, so stated: Secondary | ICD-10-CM | POA: Diagnosis not present

## 2021-08-17 DIAGNOSIS — I6381 Other cerebral infarction due to occlusion or stenosis of small artery: Secondary | ICD-10-CM | POA: Diagnosis not present

## 2021-08-17 DIAGNOSIS — R4189 Other symptoms and signs involving cognitive functions and awareness: Secondary | ICD-10-CM

## 2021-08-17 DIAGNOSIS — I6782 Cerebral ischemia: Secondary | ICD-10-CM | POA: Diagnosis not present

## 2021-08-21 ENCOUNTER — Encounter: Payer: Self-pay | Admitting: Adult Health

## 2021-08-21 ENCOUNTER — Other Ambulatory Visit: Payer: Self-pay

## 2021-08-21 ENCOUNTER — Ambulatory Visit (INDEPENDENT_AMBULATORY_CARE_PROVIDER_SITE_OTHER): Payer: Medicare Other | Admitting: Adult Health

## 2021-08-21 VITALS — BP 140/70 | HR 96 | Temp 98.7°F | Ht 67.0 in | Wt 180.0 lb

## 2021-08-21 DIAGNOSIS — K118 Other diseases of salivary glands: Secondary | ICD-10-CM

## 2021-08-21 DIAGNOSIS — G3184 Mild cognitive impairment, so stated: Secondary | ICD-10-CM

## 2021-08-21 DIAGNOSIS — R319 Hematuria, unspecified: Secondary | ICD-10-CM | POA: Diagnosis not present

## 2021-08-21 LAB — POCT URINALYSIS DIPSTICK
Glucose, UA: NEGATIVE
Ketones, UA: NEGATIVE
Nitrite, UA: POSITIVE
Protein, UA: POSITIVE — AB
Spec Grav, UA: >=1.03 — AB (ref 1.010–1.025)
Urobilinogen, UA: 1 E.U./dL
pH, UA: 6 (ref 5.0–8.0)

## 2021-08-21 MED ORDER — DONEPEZIL HCL 5 MG PO TABS: 5.0000 mg | ORAL_TABLET | Freq: Every day | ORAL | 1 refills | Status: DC

## 2021-08-21 MED ORDER — CIPROFLOXACIN HCL 500 MG PO TABS: 500.0000 mg | ORAL_TABLET | Freq: Two times a day (BID) | ORAL | 0 refills | Status: AC

## 2021-08-21 NOTE — Patient Instructions (Addendum)
It was great seeing you today   You have UTI - I have sent in an antibiotic called Cipro to take care of this.   Your MRI showed generalized shrinking ( atrophy) of the brain. I have sent in a prescription for a medication called Aricept for help slow down the progression.   As we discussed it also showed a small mass on your parotid gland that I need to check out further. I have referred you to ENT for further evaluation. They will call you to schedule your appointment

## 2021-08-21 NOTE — Progress Notes (Signed)
Subjective:    Patient ID: Paul Schwartz, male    DOB: [birth year], 80 y.o.   MRN: [id number]  HPI 81 year old male who  has a past medical history of Arthritis, Bilateral shoulder pain, BPH (benign prostatic hyperplasia), GERD (gastroesophageal reflux disease), History of kidney stones, and Vertigo.  He presents to the office today for multiple issues.   Hematuria - reports that yesterday he had a single episode of blood in his urine. He did not notice any burning and pain. No frequency or urgency. Has a history of kidney stones. Had yellow urine today but then took Azo.   2.  A couple weeks ago he was seen by neuropsychiatry, diagnosed with amnesic MCI.  It was recommended that he start Aricept, and he is interested in doing so.  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   Background mild chronic small vessel ischemic changes within the cerebral white matter and pons.   Mild generalized parenchymal atrophy.  3. Incidental finding on MRI of the brain showed 9 mm ovoid T2 hyperintense focus within the left parotid gland suspicious for a primary parotid neoplasm   Review of Systems See HPI   Past Medical History:  Diagnosis Date   Arthritis    Bilateral shoulder pain    BPH (benign prostatic hyperplasia)    GERD (gastroesophageal reflux disease)    History of kidney stones    Vertigo    dizziness    Social History   Socioeconomic History   Marital status: Married    Spouse name: Not on file   Number of children: Not on file   Years of education: Not on file   Highest education level: Not on file  Occupational History   Not on file  Tobacco Use   Smoking status: Former    Types: Cigarettes    Quit date: 04/07/1968    Years since quitting: 53.4   Smokeless tobacco: Never  Vaping Use   Vaping Use: Never used  Substance and Sexual Activity   Alcohol use: Yes    Alcohol/week: 4.0 standard drinks     Types: 4 Glasses of wine per week    Comment: weekends   Drug use: No   Sexual activity: Yes  Other Topics Concern   Not on file  Social History Narrative   Retired    Married - 37 years    One son that lives in [place]    2 grandchildren    Social Determinants of Health   Financial Resource Strain: Not on file  Food Insecurity: Not on file  Transportation Needs: Not on file  Physical Activity: Not on file  Stress: Not on file  Social Connections: Not on file  Intimate Partner Violence: Not on file    Past Surgical History:  Procedure Laterality Date   achilles tendon     surgical repair bilat   CATARACT EXTRACTION, BILATERAL     COLONOSCOPY     REVERSE SHOULDER ARTHROPLASTY Right 08/19/2018   Procedure: REVERSE RIGHT SHOULDER ARTHROPLASTY;  Surgeon: Justice Britain, MD;  Location: [facility];  Service: Orthopedics;  Laterality: Right;  163min    Family History  Problem Relation Age of Onset   Heart disease Father    Colon cancer Neg Hx    Esophageal cancer Neg Hx    Stomach cancer Neg Hx    Rectal cancer Neg Hx     Allergies  Allergen Reactions  Plasticized Base [Plastibase] Hives and Rash    Current Outpatient Medications on File Prior to Visit  Medication Sig Dispense Refill   citalopram (CELEXA) 10 MG tablet TAKE 1 TABLET BY MOUTH EVERY DAY 90 tablet 1   LORazepam (ATIVAN) 1 MG tablet Take 1 tablet (1 mg total) by mouth every 8 (eight) hours as needed for anxiety. 90 tablet 1   Multiple Vitamin (MULTIVITAMIN) tablet Take 1 tablet by mouth daily.     Omega-3 Fatty Acids (OMEGA 3 PO) Take 1 capsule by mouth 2 (two) times daily.      tamsulosin (FLOMAX) 0.4 MG CAPS capsule Take 1 capsule (0.4 mg total) by mouth daily. 90 capsule 1   No current facility-administered medications on file prior to visit.    BP 140/70   Pulse 96   Temp 98.7 F (37.1 C) (Oral)   Ht 5\' 7"  (1.702 m)   Wt 180 lb (81.6 kg)   SpO2 96%   BMI 28.19 kg/m       Objective:   Physical  Exam Vitals and nursing note reviewed.  Constitutional:      Appearance: Normal appearance.  Cardiovascular:     Rate and Rhythm: Normal rate and regular rhythm.     Pulses: Normal pulses.     Heart sounds: Normal heart sounds.  Pulmonary:     Effort: Pulmonary effort is normal.     Breath sounds: Normal breath sounds.  Abdominal:     General: Abdomen is flat. Bowel sounds are normal. There is no distension.     Palpations: Abdomen is soft.     Tenderness: There is no abdominal tenderness. There is no right CVA tenderness or left CVA tenderness.  Musculoskeletal:        General: Normal range of motion.  Skin:    General: Skin is warm and dry.     Capillary Refill: Capillary refill takes less than 2 seconds.  Neurological:     General: No focal deficit present.     Mental Status: He is alert and oriented to person, place, and time.  Psychiatric:        Mood and Affect: Mood normal.        Behavior: Behavior normal.        Thought Content: Thought content normal.        Judgment: Judgment normal.      Assessment & Plan:  1. Hematuria, unspecified type  - POC Urinalysis Dipstick + bilirubin, blood, nitrates, leukocytes. - ciprofloxacin (CIPRO) 500 MG tablet; Take 1 tablet (500 mg total) by mouth 2 (two) times daily for 7 days.  Dispense: 14 tablet; Refill: 0 - Culture, Urine  2. Mass of left parotid gland  - Ambulatory referral to ENT  3. Mild cognitive impairment  - donepezil (ARICEPT) 5 MG tablet; Take 1 tablet (5 mg total) by mouth at bedtime.  Dispense: 90 tablet; Refill: [place]

## 2021-08-23 LAB — URINE CULTURE
MICRO NUMBER:: 12493969
Result:: NO GROWTH
SPECIMEN QUALITY:: ADEQUATE

## 2021-09-17 ENCOUNTER — Ambulatory Visit (INDEPENDENT_AMBULATORY_CARE_PROVIDER_SITE_OTHER): Payer: Medicare Other | Admitting: Adult Health

## 2021-09-17 ENCOUNTER — Other Ambulatory Visit: Payer: Self-pay

## 2021-09-17 ENCOUNTER — Encounter: Payer: Self-pay | Admitting: Adult Health

## 2021-09-17 ENCOUNTER — Ambulatory Visit (INDEPENDENT_AMBULATORY_CARE_PROVIDER_SITE_OTHER): Payer: Medicare Other

## 2021-09-17 VITALS — BP 128/80 | HR 76 | Temp 98.4°F | Ht 67.0 in | Wt 180.0 lb

## 2021-09-17 DIAGNOSIS — M533 Sacrococcygeal disorders, not elsewhere classified: Secondary | ICD-10-CM | POA: Diagnosis not present

## 2021-09-17 MED ORDER — NAPROXEN 500 MG PO TABS: 500.0000 mg | ORAL_TABLET | Freq: Two times a day (BID) | ORAL | 0 refills | Status: DC

## 2021-09-17 NOTE — Progress Notes (Signed)
Subjective:    Patient ID: Paul Schwartz, male    DOB: 1940/12/27, 80 y.o.   MRN: [id number]  HPI  80 year old male who  has a past medical history of Arthritis, Bilateral shoulder pain, BPH (benign prostatic hyperplasia), GERD (gastroesophageal reflux disease), History of kidney stones, and Vertigo.  He presents to the office today for low back pain s/p fall about a month ago. He reports that he had a little too much alcohol to drink one evening and went to go to the bathroom and when he went to sit down he missed the toilet seat and fell onto his bathroom floor.   He reports pain in his sacral area. Pain is worse with sitting and twisting towards his right side.     Review of Systems See HPI   Past Medical History:  Diagnosis Date   Arthritis    Bilateral shoulder pain    BPH (benign prostatic hyperplasia)    GERD (gastroesophageal reflux disease)    History of kidney stones    Vertigo    dizziness    Social History   Socioeconomic History   Marital status: Married    Spouse name: Not on file   Number of children: Not on file   Years of education: Not on file   Highest education level: Not on file  Occupational History   Not on file  Tobacco Use   Smoking status: Former    Types: Cigarettes    Quit date: 04/07/1968    Years since quitting: 53.4   Smokeless tobacco: Never  Vaping Use   Vaping Use: Never used  Substance and Sexual Activity   Alcohol use: Yes    Alcohol/week: 4.0 standard drinks    Types: 4 Glasses of wine per week    Comment: weekends   Drug use: No   Sexual activity: Yes  Other Topics Concern   Not on file  Social History Narrative   Retired    Married - 45 years    One son that lives in [place]    2 grandchildren    Social Determinants of Health   Financial Resource Strain: Not on file  Food Insecurity: Not on file  Transportation Needs: Not on file  Physical Activity: Not on file  Stress: Not on file  Social Connections:  Not on file  Intimate Partner Violence: Not on file    Past Surgical History:  Procedure Laterality Date   achilles tendon     surgical repair bilat   CATARACT EXTRACTION, BILATERAL     COLONOSCOPY     REVERSE SHOULDER ARTHROPLASTY Right 08/19/2018   Procedure: REVERSE RIGHT SHOULDER ARTHROPLASTY;  Surgeon: Justice Britain, MD;  Location: [facility];  Service: Orthopedics;  Laterality: Right;  174min    Family History  Problem Relation Age of Onset   Heart disease Father    Colon cancer Neg Hx    Esophageal cancer Neg Hx    Stomach cancer Neg Hx    Rectal cancer Neg Hx     Allergies  Allergen Reactions   Plasticized Base [Plastibase] Hives and Rash    Current Outpatient Medications on File Prior to Visit  Medication Sig Dispense Refill   citalopram (CELEXA) 10 MG tablet TAKE 1 TABLET BY MOUTH EVERY DAY 90 tablet 1   donepezil (ARICEPT) 5 MG tablet Take 1 tablet (5 mg total) by mouth at bedtime. 90 tablet 1   LORazepam (ATIVAN) 1 MG tablet Take 1 tablet (1 mg  total) by mouth every 8 (eight) hours as needed for anxiety. 90 tablet 1   Multiple Vitamin (MULTIVITAMIN) tablet Take 1 tablet by mouth daily.     Omega-3 Fatty Acids (OMEGA 3 PO) Take 1 capsule by mouth 2 (two) times daily.      tamsulosin (FLOMAX) 0.4 MG CAPS capsule Take 1 capsule (0.4 mg total) by mouth daily. 90 capsule 1   No current facility-administered medications on file prior to visit.    BP 128/80   Pulse 76   Temp 98.4 F (36.9 C) (Oral)   Ht 5\' 7"  (1.702 m)   Wt 180 lb (81.6 kg)   SpO2 98%   BMI 28.19 kg/m       Objective:   Physical Exam Vitals and nursing note reviewed.  Constitutional:      Appearance: Normal appearance.  Musculoskeletal:        General: Normal range of motion.     Comments: Slight discomfort with pain to sacral area   Skin:    General: Skin is warm and dry.     Capillary Refill: Capillary refill takes less than 2 seconds.  Neurological:     General: No focal deficit  present.     Mental Status: He is alert and oriented to person, place, and time.  Psychiatric:        Mood and Affect: Mood normal.        Behavior: Behavior normal.        Thought Content: Thought content normal.      Assessment & Plan:  1. Sacral pain - DG Sacrum/Coccyx; Future - naproxen (NAPROSYN) 500 MG tablet; Take 1 tablet (500 mg total) by mouth 2 (two) times daily with a meal.  Dispense: 30 tablet; Refill: 0  Dorothyann Peng, NP

## 2021-11-07 ENCOUNTER — Encounter: Payer: Self-pay | Admitting: Adult Health

## 2021-11-07 ENCOUNTER — Telehealth (INDEPENDENT_AMBULATORY_CARE_PROVIDER_SITE_OTHER): Payer: Medicare Other | Admitting: Adult Health

## 2021-11-07 VITALS — Ht 67.0 in | Wt 180.0 lb

## 2021-11-07 DIAGNOSIS — B349 Viral infection, unspecified: Secondary | ICD-10-CM

## 2021-11-07 MED ORDER — FLUTICASONE PROPIONATE 50 MCG/ACT NA SUSP: 2.0000 | Freq: Every day | NASAL | 0 refills | Status: DC

## 2021-11-07 NOTE — Progress Notes (Signed)
Virtual Visit via Telephone Note  I connected with Paul Schwartz on 11/07/21 at 10:00 AM EST by telephone and verified that I am speaking with the correct person using two identifiers.   I discussed the limitations, risks, security and privacy concerns of performing an evaluation and management service by telephone and the availability of in person appointments. I also discussed with the patient that there may be a patient responsible charge related to this service. The patient expressed understanding and agreed to proceed.  Location patient: home Location provider: work or home office Participants present for the call: patient, provider Patient did not have a visit in the prior 7 days to address this/these issue(s).   History of Present Illness: 80 year old male who  has a past medical history of Arthritis, Bilateral shoulder pain, BPH (benign prostatic hyperplasia), GERD (gastroesophageal reflux disease), History of kidney stones, and Vertigo.  His symptoms started yesterday. Symptoms include nasal congestion without rhinorrhea  He denies fevers or chills, shortness of breath or wheezing, sore throat, coughing or feeling acutely ill.   Has not been using anything over the counter.   No sick contacts  Has not tested for covid or flu    Observations/Objective: Patient sounds cheerful and well on the phone. I do not appreciate any SOB. Speech and thought processing are grossly intact. Patient reported vitals:  Assessment and Plan: 1. Viral illness - Advised likely common cold. Should be better in 5-7 days. He can do a home test for COVID 19 and inform me if he has covid. Will send in flonase.Follow up as needed - fluticasone (FLONASE) 50 MCG/ACT nasal spray; Place 2 sprays into both nostrils daily.  Dispense: 16 g; Refill: 0    Follow Up Instructions:  I did not refer this patient for an OV in the next 24 hours for this/these issue(s).  I discussed the assessment and  treatment plan with the patient. The patient was provided an opportunity to ask questions and all were answered. The patient agreed with the plan and demonstrated an understanding of the instructions.   The patient was advised to call back or seek an in-person evaluation if the symptoms worsen or if the condition fails to improve as anticipated.  I provided 12 minutes of non-face-to-face time during this encounter.   Dorothyann Peng, NP

## 2021-11-12 ENCOUNTER — Telehealth: Payer: Self-pay

## 2021-11-12 NOTE — Telephone Encounter (Signed)
---  Caller states husband has nasal and throat congestion, slightly hoarse voice and mild, intermittent cough. No fever, chills, or muscle aches. Symptoms started on 12/26.  11/12/2021 12:04:56 PM [place], RN, Raquel Sarna  Of Note: pt was seen [facility] on 12/28 by PCP. Plan at that time: 1. Viral illness - Advised likely common cold. Should be better in 5-7 days. He can do a home test for COVID 19 and inform me if he has covid. Will send in flonase.Follow up as needed

## 2021-11-14 ENCOUNTER — Ambulatory Visit: Payer: Medicare Other | Admitting: Family Medicine

## 2021-12-04 ENCOUNTER — Other Ambulatory Visit: Payer: Self-pay | Admitting: Adult Health

## 2021-12-04 DIAGNOSIS — B349 Viral infection, unspecified: Secondary | ICD-10-CM

## 2021-12-12 ENCOUNTER — Other Ambulatory Visit: Payer: Self-pay | Admitting: Adult Health

## 2021-12-12 DIAGNOSIS — G3184 Mild cognitive impairment, so stated: Secondary | ICD-10-CM

## 2021-12-13 ENCOUNTER — Telehealth: Payer: Self-pay | Admitting: Adult Health

## 2021-12-13 NOTE — Telephone Encounter (Signed)
Left message for patient to call back and schedule Medicare Annual Wellness Visit (AWV) either virtually or in office. Left  my [name] number [phone]   Last AWV 11/25/17 please schedule at anytime with [facility] Nurse Health Advisor 1 or 2   This should be a 45 minute visit.

## 2021-12-19 IMAGING — MR MR HEAD W/O CM
10 series · 48 of 48 positions shown · non-contrast
Comparison: No pertinent prior exams available for comparison.

CLINICAL DATA: Cognitive impairment. Memory loss. Additional
history provided by scanning technologist: Patient reports
forgetfulness for 1 year.

EXAM:
MRI HEAD WITHOUT CONTRAST
TECHNIQUE: Multiplanar, multiecho pulse sequences of the brain and surrounding
structures were obtained without intravenous contrast.

[Series 2: T1 · sagittal · 5.0mm · 0.45mm/px · 2 of 21 slices shown]
[im 1/21]
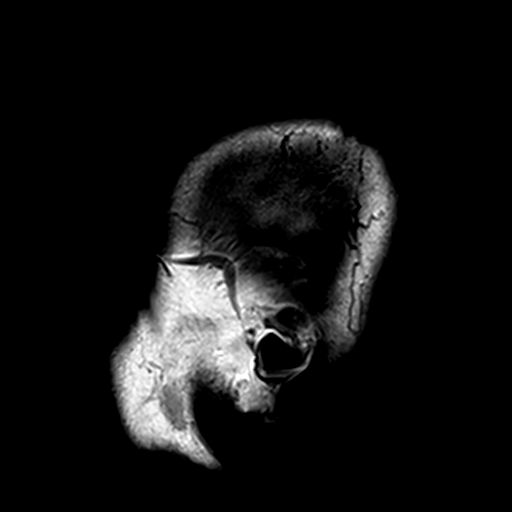
[im 21/21]
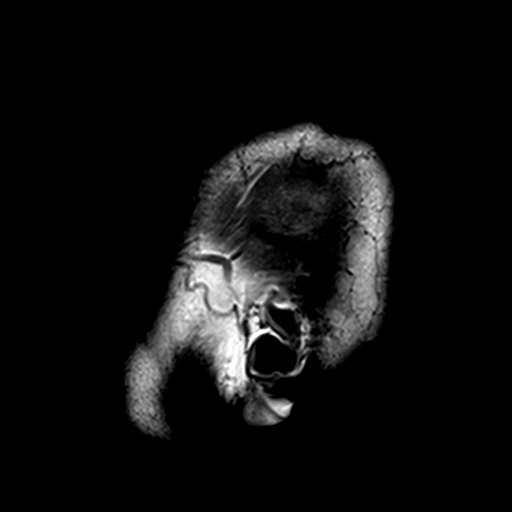

[Series 3: DWI · axial · 3.0mm · 1.80mm/px · z∈[-35,+111]mm · 9 of 99 slices shown (1 of 4)]
[im 1/99]
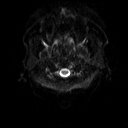
[im 13/99]
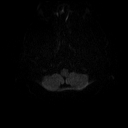
[im 25/99]
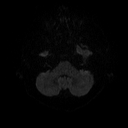
[im 37/99]
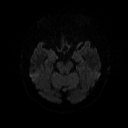
[im 50/99]
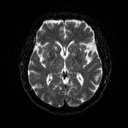
[im 62/99]
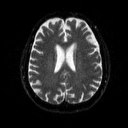
[im 74/99]
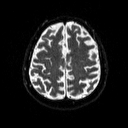
[im 86/99]
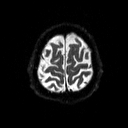
[im 99/99]
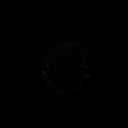

[Series 4: DWI · axial · 3.0mm · 1.80mm/px · z∈[-35,+111]mm · 4 of 48 slices shown (2 of 4)]
[im 1/48]
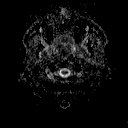
[im 16/48]
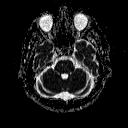
[im 32/48]
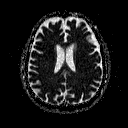
[im 48/48]
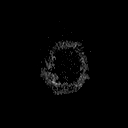

[Series 5: DWI · coronal · 5.0mm · 1.80mm/px · 6 of 66 slices shown (3 of 4)]
[im 1/66]
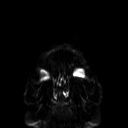
[im 14/66]
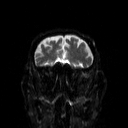
[im 27/66]
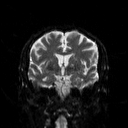
[im 40/66]
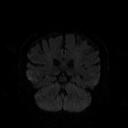
[im 53/66]
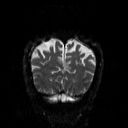
[im 66/66]
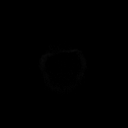

[Series 6: DWI · coronal · 5.0mm · 1.80mm/px · 3 of 34 slices shown (4 of 4)]
[im 1/34]
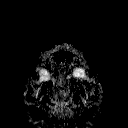
[im 17/34]
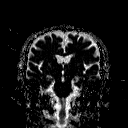
[im 34/34]
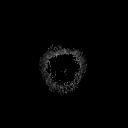

[Series 7: T2 · axial · 5.0mm · 0.51mm/px · z∈[-40,+119]mm · 2 of 24 slices shown (1 of 2)]
[im 1/24]
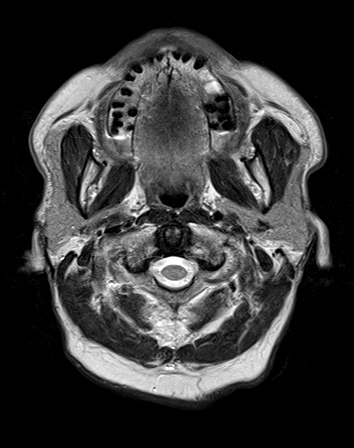
[im 24/24]
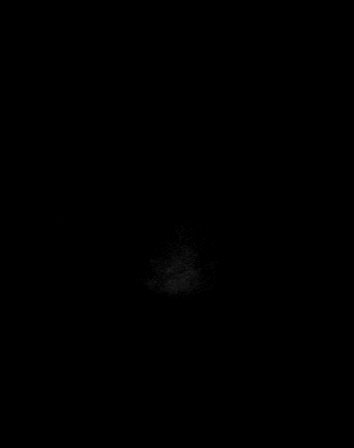

[Series 8: FLAIR · axial · 3.0mm · 0.45mm/px · z∈[-36,+116]mm · 3 of 34 slices shown]
[im 1/34]
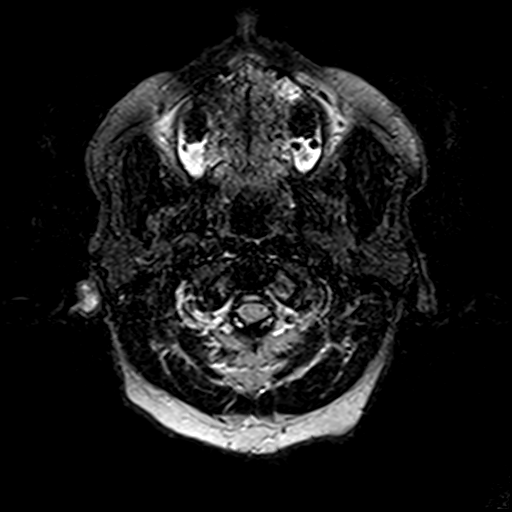
[im 17/34]
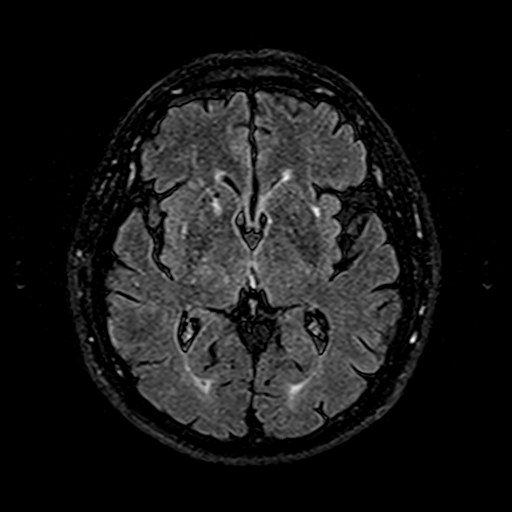
[im 34/34]
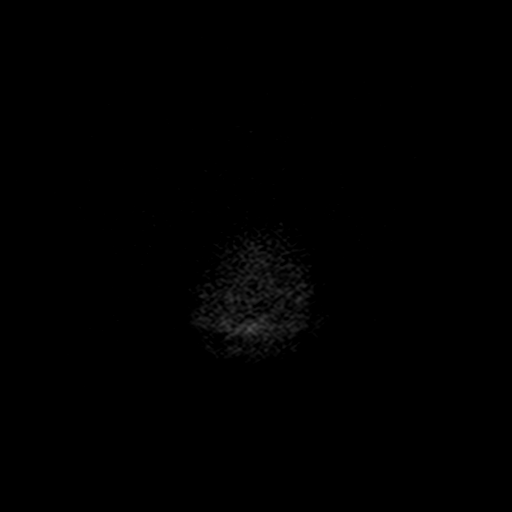

[Series 10: swi_images · axial · 4.0mm · 0.90mm/px · z∈[-37,+117]mm · 4 of 40 slices shown]
[im 1/40]
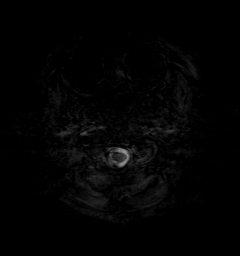
[im 14/40]
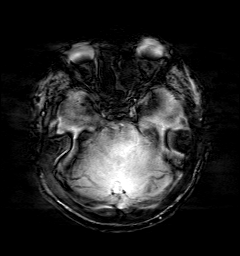
[im 27/40]
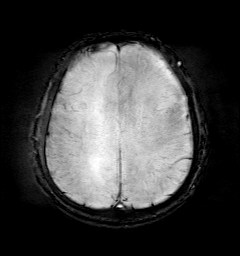
[im 40/40]
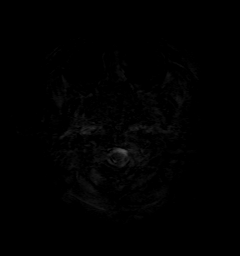

[Series 11: t1_mpr_tra · axial · 1.0mm · 0.71mm/px · z∈[-31,+110]mm · 13 of 144 slices shown]
[im 1/144]
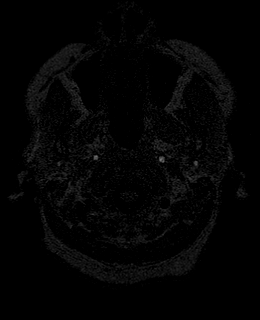
[im 12/144]
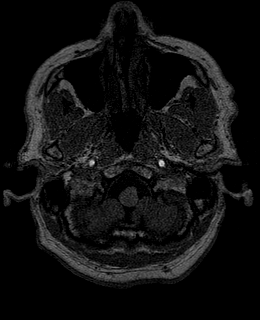
[im 24/144]
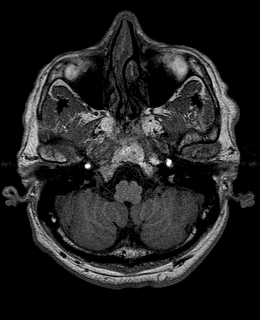
[im 36/144]
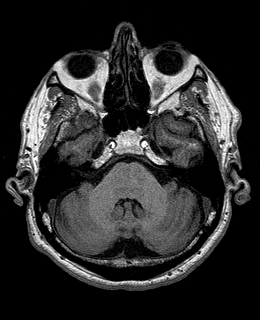
[im 48/144]
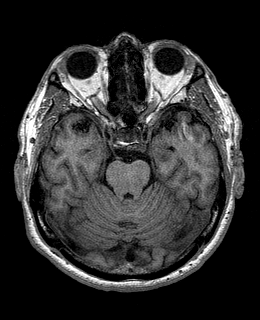
[im 60/144]
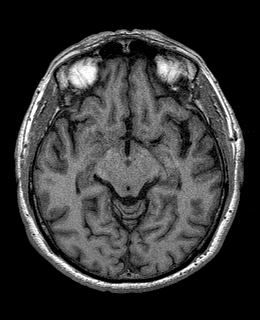
[im 72/144]
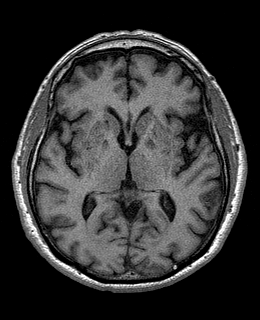
[im 84/144]
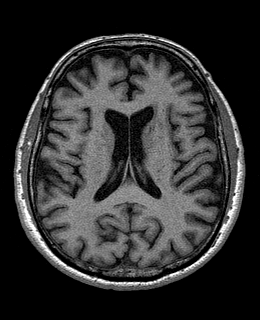
[im 96/144]
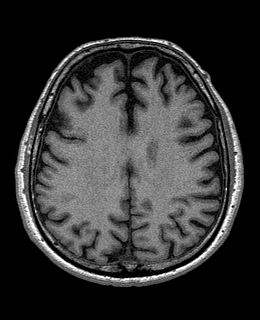
[im 108/144]
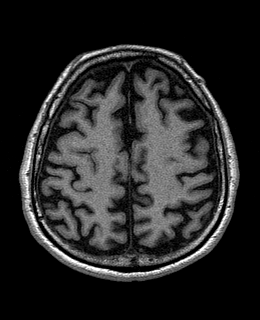
[im 120/144]
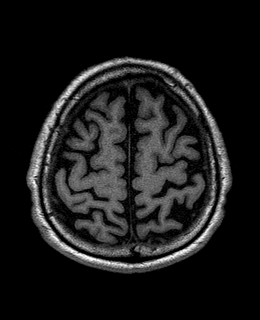
[im 132/144]
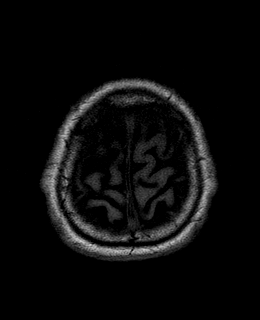
[im 144/144]
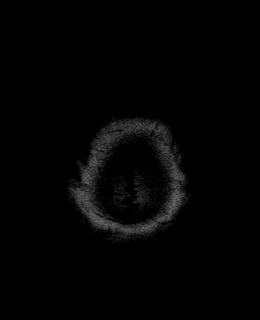

[Series 12: T2 · coronal · 5.0mm · 0.45mm/px · 2 of 27 slices shown (2 of 2)]
[im 1/27]
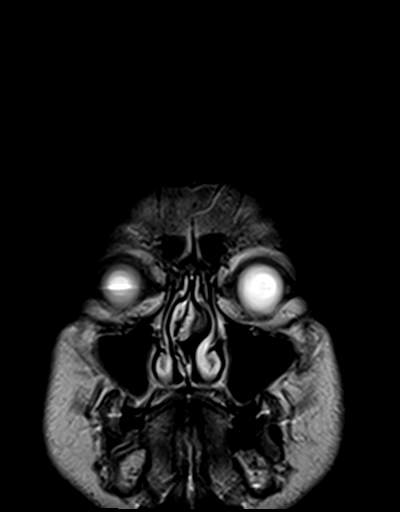
[im 27/27]
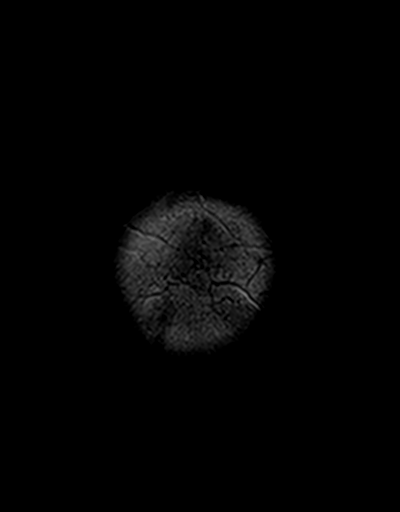

[48 of 48 positions shown; findings below may reference images not displayed]

FINDINGS: Brain:

Mild intermittent motion degradation.

Mild generalized cerebral and cerebellar atrophy.

Chronic lacunar infarcts within bilateral basal ganglia, thalami and
within the pons.

Background mild multifocal T2 FLAIR hyperintense signal abnormality
within the cerebral white matter and pons, nonspecific but
compatible with chronic small vessel ischemic disease.

There is no acute infarct.

No evidence of an intracranial mass.

No chronic intracranial blood products.

No extra-axial fluid collection.

No midline shift.

Vascular: Maintained flow voids within the proximal large arterial
vessels.

Skull and upper cervical spine: Focal suspicious marrow lesion.

Sinuses/Orbits: Visualized orbits show no acute finding. Trace
bilateral ethmoid sinus mucosal thickening.

Other: 9 mm ovoid T2 hyperintense focus within the left parotid
gland suspicious for a primary parotid neoplasm (series 12, image
18).

Impression 6 will be called to the ordering clinician or
representative by the Radiologist Assistant, and communication
documented in the PACS or [REDACTED].
IMPRESSION: Mildly motion degraded exam.

No evidence of acute intracranial abnormality.

Chronic lacunar infarcts within the bilateral basal ganglia, thalami
and within the pons.

Background mild chronic small vessel ischemic changes within the
cerebral white matter and pons.

Mild generalized parenchymal atrophy.

9 mm ovoid T2 hyperintense focus within the left parotid gland
suspicious for primary parotid neoplasm. ENT consultation
recommended.

## 2021-12-26 DIAGNOSIS — H04123 Dry eye syndrome of bilateral lacrimal glands: Secondary | ICD-10-CM | POA: Diagnosis not present

## 2021-12-26 DIAGNOSIS — H26492 Other secondary cataract, left eye: Secondary | ICD-10-CM | POA: Diagnosis not present

## 2021-12-26 DIAGNOSIS — H353131 Nonexudative age-related macular degeneration, bilateral, early dry stage: Secondary | ICD-10-CM | POA: Diagnosis not present

## 2021-12-26 DIAGNOSIS — H40013 Open angle with borderline findings, low risk, bilateral: Secondary | ICD-10-CM | POA: Diagnosis not present

## 2022-01-06 DIAGNOSIS — Z96611 Presence of right artificial shoulder joint: Secondary | ICD-10-CM | POA: Diagnosis not present

## 2022-01-14 ENCOUNTER — Encounter: Payer: Self-pay | Admitting: Adult Health

## 2022-01-14 ENCOUNTER — Ambulatory Visit (INDEPENDENT_AMBULATORY_CARE_PROVIDER_SITE_OTHER): Payer: Medicare Other | Admitting: Adult Health

## 2022-01-14 ENCOUNTER — Encounter: Payer: Medicare Other | Admitting: Adult Health

## 2022-01-14 VITALS — BP 120/88 | HR 75 | Temp 98.0°F | Ht 67.0 in | Wt 190.0 lb

## 2022-01-14 DIAGNOSIS — E785 Hyperlipidemia, unspecified: Secondary | ICD-10-CM

## 2022-01-14 DIAGNOSIS — F419 Anxiety disorder, unspecified: Secondary | ICD-10-CM

## 2022-01-14 DIAGNOSIS — Z0000 Encounter for general adult medical examination without abnormal findings: Secondary | ICD-10-CM

## 2022-01-14 DIAGNOSIS — G3184 Mild cognitive impairment, so stated: Secondary | ICD-10-CM

## 2022-01-14 DIAGNOSIS — N400 Enlarged prostate without lower urinary tract symptoms: Secondary | ICD-10-CM

## 2022-01-14 LAB — COMPREHENSIVE METABOLIC PANEL
ALT: 17 U/L (ref 0–53)
AST: 23 U/L (ref 0–37)
Albumin: 4.3 g/dL (ref 3.5–5.2)
Alkaline Phosphatase: 45 U/L (ref 39–117)
BUN: 19 mg/dL (ref 6–23)
CO2: 30 mEq/L (ref 19–32)
Calcium: 9.3 mg/dL (ref 8.4–10.5)
Chloride: 105 mEq/L (ref 96–112)
Creatinine, Ser: 0.87 mg/dL (ref 0.40–1.50)
GFR: 80.81 mL/min (ref >60.00)
Glucose, Bld: 93 mg/dL (ref 70–99)
Potassium: 4.1 mEq/L (ref 3.5–5.1)
Sodium: 142 mEq/L (ref 135–145)
Total Bilirubin: 0.9 mg/dL (ref 0.2–1.2)
Total Protein: 6.7 g/dL (ref 6.0–8.3)

## 2022-01-14 LAB — LIPID PANEL
Cholesterol: 191 mg/dL (ref 0–200)
HDL: 66.4 mg/dL (ref >39.00)
LDL Cholesterol: 108 mg/dL — ABNORMAL HIGH (ref 0–99)
NonHDL: 124.6
Total CHOL/HDL Ratio: 3
Triglycerides: 83 mg/dL (ref 0.0–149.0)
VLDL: 16.6 mg/dL (ref 0.0–40.0)

## 2022-01-14 LAB — CBC WITH DIFFERENTIAL/PLATELET
Basophils Absolute: 0 10*3/uL (ref 0.0–0.1)
Basophils Relative: 1 % (ref 0.0–3.0)
Eosinophils Absolute: 0.1 10*3/uL (ref 0.0–0.7)
Eosinophils Relative: 1.4 % (ref 0.0–5.0)
HCT: 42.6 % (ref 39.0–52.0)
Hemoglobin: 14.5 g/dL (ref 13.0–17.0)
Lymphocytes Relative: 23 % (ref 12.0–46.0)
Lymphs Abs: 1.1 10*3/uL (ref 0.7–4.0)
MCHC: 34.1 g/dL (ref 30.0–36.0)
MCV: 94.5 fl (ref 78.0–100.0)
Monocytes Absolute: 0.4 10*3/uL (ref 0.1–1.0)
Monocytes Relative: 8 % (ref 3.0–12.0)
Neutro Abs: 3 10*3/uL (ref 1.4–7.7)
Neutrophils Relative %: 66.6 % (ref 43.0–77.0)
Platelets: 177 10*3/uL (ref 150.0–400.0)
RBC: 4.5 Mil/uL (ref 4.22–5.81)
RDW: 13.5 % (ref 11.5–15.5)
WBC: 4.6 10*3/uL (ref 4.0–10.5)

## 2022-01-14 LAB — TSH: TSH: 1.38 u[IU]/mL (ref 0.35–5.50)

## 2022-01-14 MED ORDER — DONEPEZIL HCL 10 MG PO TABS: 10.0000 mg | ORAL_TABLET | Freq: Every day | ORAL | 3 refills | Status: DC

## 2022-01-14 MED ORDER — CITALOPRAM HYDROBROMIDE 10 MG PO TABS: 10.0000 mg | ORAL_TABLET | Freq: Every day | ORAL | 1 refills | Status: DC

## 2022-01-14 NOTE — Patient Instructions (Signed)
It was great seeing you today  ? ?I am going to increase your Aricept from 5 mg to 10 mg - you can take two tabs of what you have at home until you get the new prescription  ? ? ?

## 2022-01-14 NOTE — Progress Notes (Signed)
Subjective:    Patient ID: Paul Schwartz, male    DOB: Sep 06, 1941, 81 y.o.   MRN: [id number]  HPI Patient presents for yearly preventative medicine examination. He is a pleasant 81 year old male who  has a past medical history of Arthritis, Bilateral shoulder pain, BPH (benign prostatic hyperplasia), GERD (gastroesophageal reflux disease), History of kidney stones, and Vertigo.  Anxiety-controlled on Celexa 10 mg daily and Ativan 1 mg as needed.  His symptoms seem to be well controlled  History of BPH-followed by urology on a routine basis.  Denies symptoms.   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 His wife feels like the medication is helping with his memory. No new deficits  Hyperlipidemia - mildly elevated LDL in the past. Not currently on statin. Does take omege 3   All immunizations and health maintenance protocols were reviewed with the patient and needed orders were placed.  Appropriate screening laboratory values were ordered for the patient including screening of hyperlipidemia, renal function and hepatic function.   Medication reconciliation,  past medical history, social history, problem list and allergies were reviewed in detail with the patient  Goals were established with regard to weight loss, exercise, and  diet in compliance with medications Wt Readings from Last 3 Encounters:  01/14/22 190 lb (86.2 kg)  11/07/21 180 lb (81.6 kg)  09/17/21 180 lb (81.6 kg)   Review of Systems  Constitutional: Negative.   HENT: Negative.    Eyes: Negative.   Respiratory: Negative.    Cardiovascular: Negative.   Gastrointestinal: Negative.   Endocrine: Negative.   Genitourinary: Negative.   Musculoskeletal: Negative.   Skin: Negative.   Allergic/Immunologic: Negative.   Neurological: Negative.   Hematological: Negative.   Psychiatric/Behavioral:  Positive for confusion.   All other systems reviewed and are negative.  Past Medical History:  Diagnosis  Date   Arthritis    Bilateral shoulder pain    BPH (benign prostatic hyperplasia)    GERD (gastroesophageal reflux disease)    History of kidney stones    Vertigo    dizziness    Social History   Socioeconomic History   Marital status: Married    Spouse name: Not on file   Number of children: Not on file   Years of education: Not on file   Highest education level: Not on file  Occupational History   Not on file  Tobacco Use   Smoking status: Former    Types: Cigarettes    Quit date: 04/07/1968    Years since quitting: 53.8   Smokeless tobacco: Never  Vaping Use   Vaping Use: Never used  Substance and Sexual Activity   Alcohol use: Yes    Alcohol/week: 4.0 standard drinks    Types: 4 Glasses of wine per week    Comment: weekends   Drug use: No   Sexual activity: Yes  Other Topics Concern   Not on file  Social History Narrative   Retired    Married - 81 years    One son that lives in [place]    2 grandchildren    Social Determinants of Health   Financial Resource Strain: Not on file  Food Insecurity: Not on file  Transportation Needs: Not on file  Physical Activity: Not on file  Stress: Not on file  Social Connections: Not on file  Intimate Partner Violence: Not on file    Past Surgical History:  Procedure Laterality Date   achilles tendon  surgical repair bilat   CATARACT EXTRACTION, BILATERAL     COLONOSCOPY     REVERSE SHOULDER ARTHROPLASTY Right 08/19/2018   Procedure: REVERSE RIGHT SHOULDER ARTHROPLASTY;  Surgeon: Justice Britain, MD;  Location: [facility];  Service: Orthopedics;  Laterality: Right;  171mn    Family History  Problem Relation Age of Onset   Heart disease Father    Colon cancer Neg Hx    Esophageal cancer Neg Hx    Stomach cancer Neg Hx    Rectal cancer Neg Hx     Allergies  Allergen Reactions   Plasticized Base [Plastibase] Hives and Rash    Current Outpatient Medications on File Prior to Visit  Medication Sig Dispense  Refill   citalopram (CELEXA) 10 MG tablet TAKE 1 TABLET BY MOUTH EVERY DAY 90 tablet 1   LORazepam (ATIVAN) 1 MG tablet Take 1 tablet (1 mg total) by mouth every 8 (eight) hours as needed for anxiety. 90 tablet 1   Multiple Vitamin (MULTIVITAMIN) tablet Take 1 tablet by mouth daily.     Omega-3 Fatty Acids (OMEGA 3 PO) Take 1 capsule by mouth 2 (two) times daily.      [DISCONTINUED] donepezil (ARICEPT) 5 MG tablet TAKE 1 TABLET BY MOUTH EVERYDAY AT BEDTIME 90 tablet 1   No current facility-administered medications on file prior to visit.    BP 120/88    Pulse 75    Temp 98 F (36.7 C) (Oral)    Ht '5\' 7"'$  (1.702 m)    Wt 190 lb (86.2 kg)    SpO2 98%    BMI 29.76 kg/m       Objective:   Physical Exam Vitals and nursing note reviewed.  Constitutional:      General: He is not in acute distress.    Appearance: Normal appearance. He is well-developed and normal weight.  HENT:     Head: Normocephalic and atraumatic.     Right Ear: Tympanic membrane, ear canal and external ear normal. There is no impacted cerumen.     Left Ear: Tympanic membrane, ear canal and external ear normal. There is no impacted cerumen.     Nose: Nose normal. No congestion or rhinorrhea.     Mouth/Throat:     Mouth: Mucous membranes are moist.     Pharynx: Oropharynx is clear. No oropharyngeal exudate or posterior oropharyngeal erythema.  Eyes:     General:        Right eye: No discharge.        Left eye: No discharge.     Extraocular Movements: Extraocular movements intact.     Conjunctiva/sclera: Conjunctivae normal.     Pupils: Pupils are equal, round, and reactive to light.  Neck:     Vascular: No carotid bruit.     Trachea: No tracheal deviation.  Cardiovascular:     Rate and Rhythm: Normal rate and regular rhythm.     Pulses: Normal pulses.     Heart sounds: Normal heart sounds. No murmur heard.   No friction rub. No gallop.  Pulmonary:     Effort: Pulmonary effort is normal. No respiratory distress.      Breath sounds: Normal breath sounds. No stridor. No wheezing, rhonchi or rales.  Chest:     Chest wall: No tenderness.  Abdominal:     General: Bowel sounds are normal. There is no distension.     Palpations: Abdomen is soft. There is no mass.     Tenderness: There is no abdominal tenderness.  There is no right CVA tenderness, left CVA tenderness, guarding or rebound.     Hernia: No hernia is present.  Musculoskeletal:        General: No swelling, tenderness, deformity or signs of injury. Normal range of motion.     Right lower leg: No edema.     Left lower leg: No edema.  Lymphadenopathy:     Cervical: No cervical adenopathy.  Skin:    General: Skin is warm and dry.     Capillary Refill: Capillary refill takes less than 2 seconds.     Coloration: Skin is not jaundiced or pale.     Findings: No bruising, erythema, lesion or rash.  Neurological:     General: No focal deficit present.     Mental Status: He is alert and oriented to person, place, and time.     Cranial Nerves: No cranial nerve deficit.     Sensory: No sensory deficit.     Motor: No weakness.     Coordination: Coordination normal.     Gait: Gait normal.     Deep Tendon Reflexes: Reflexes normal.  Psychiatric:        Mood and Affect: Mood normal.        Behavior: Behavior normal.        Thought Content: Thought content normal.        Judgment: Judgment normal.       Assessment & Plan:  1. Routine general medical examination at a health care facility - Follow up in one year or sooner if needed - CBC with Differential/Platelet; Future - Comprehensive metabolic panel; Future - Lipid panel; Future - TSH; Future  2. Anxiety - Controlled.  - citalopram (CELEXA) 10 MG tablet; Take 1 tablet (10 mg total) by mouth daily.  Dispense: 90 tablet; Refill: 1  3. Benign prostatic hyperplasia without lower urinary tract symptoms - Follow up with urology as directed  4. Mild cognitive impairment - Will increase  Aricpet from 5 mg to 10 mg  - donepezil (ARICEPT) 10 MG tablet; Take 1 tablet (10 mg total) by mouth at bedtime.  Dispense: 90 tablet; Refill: 3 - citalopram (CELEXA) 10 MG tablet; Take 1 tablet (10 mg total) by mouth daily.  Dispense: 90 tablet; Refill: 1 - CBC with Differential/Platelet; Future - Comprehensive metabolic panel; Future - Lipid panel; Future - TSH; Future  5. Hyperlipidemia, unspecified hyperlipidemia type - Consider low dose statin  - CBC with Differential/Platelet; Future - Comprehensive metabolic panel; Future - Lipid panel; Future - TSH; Future  Dorothyann Peng, NP

## 2022-01-16 MED ORDER — SIMVASTATIN 5 MG PO TABS: 5.0000 mg | ORAL_TABLET | Freq: Every day | ORAL | 3 refills | Status: DC

## 2022-01-16 NOTE — Addendum Note (Signed)
Addended by: Gwenyth Ober R on: 01/16/2022 03:00 PM ? ? Modules accepted: Orders ? ?

## 2022-01-19 IMAGING — DX DG SACRUM/COCCYX 2+V
3 series · 3 of 3 positions shown · non-contrast
Comparison: None.

CLINICAL DATA: Sacral pain for 1 month after fall.

EXAM:
SACRUM AND COCCYX - 2+ VIEW

[ap sacrum]
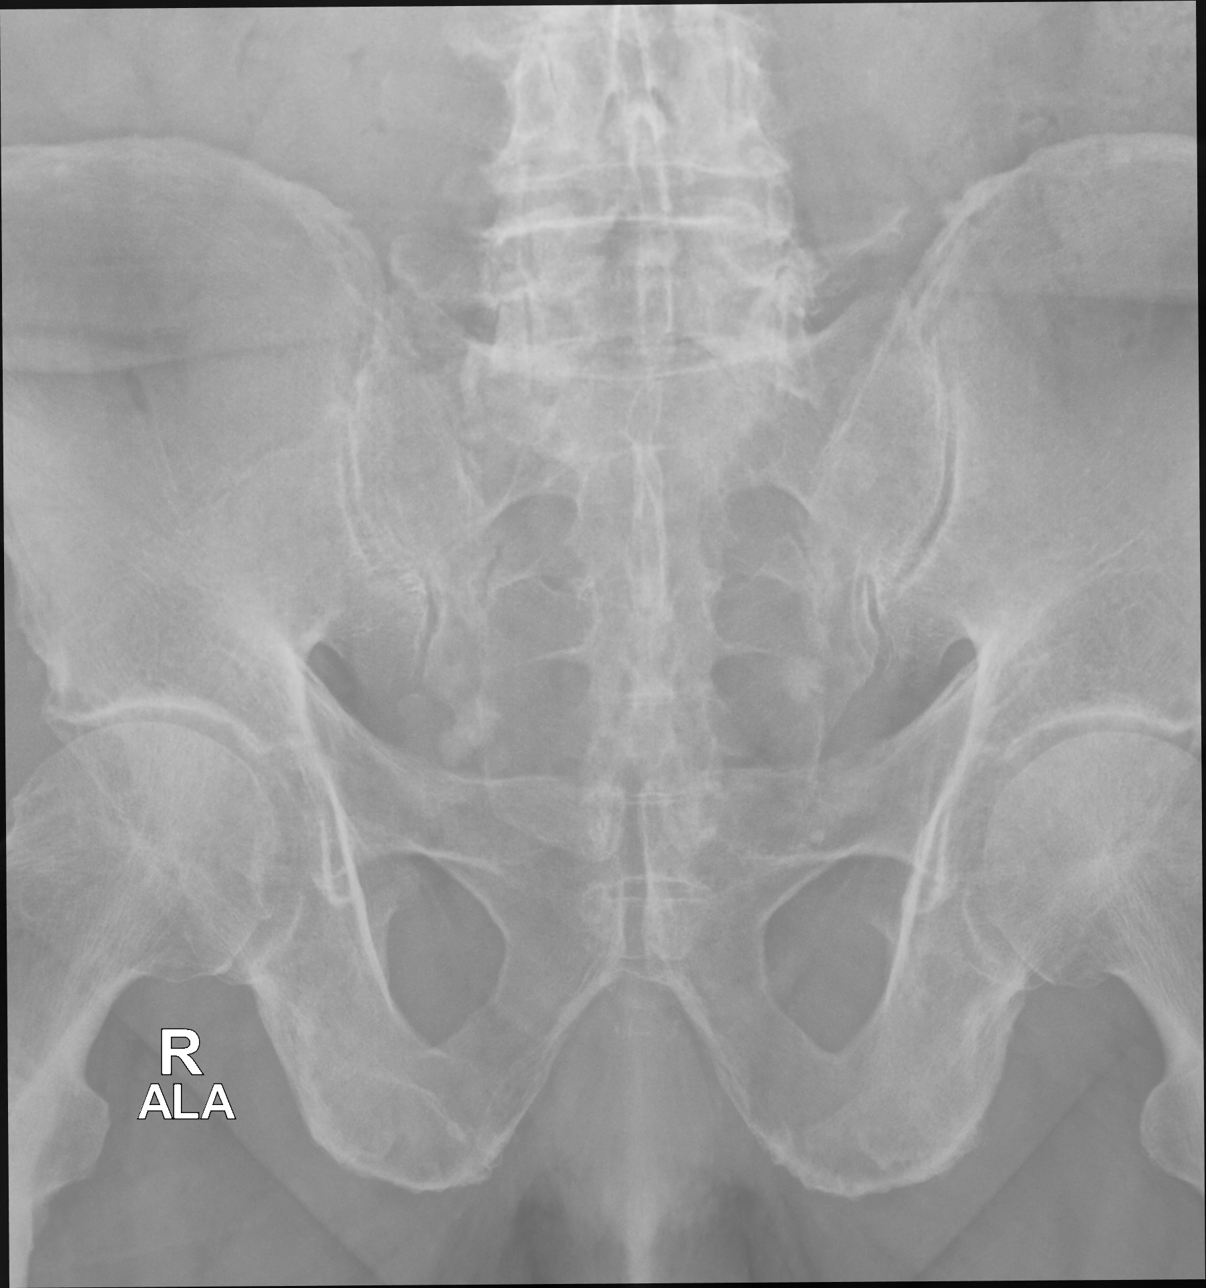

[ap coccyx]
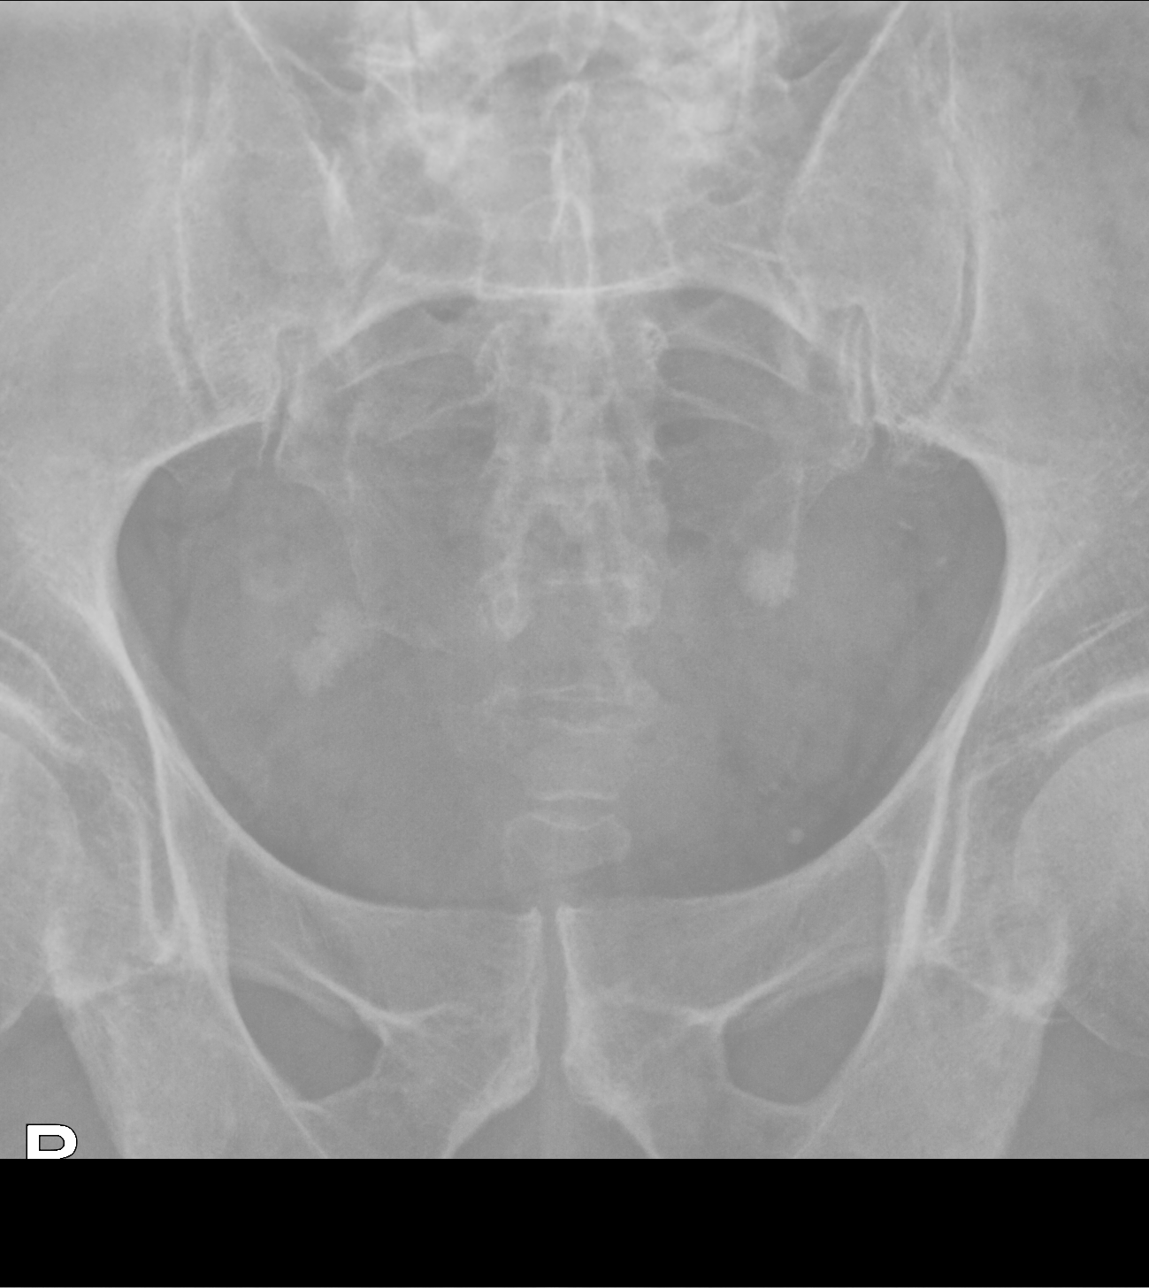

[lat scarum/coccyx]
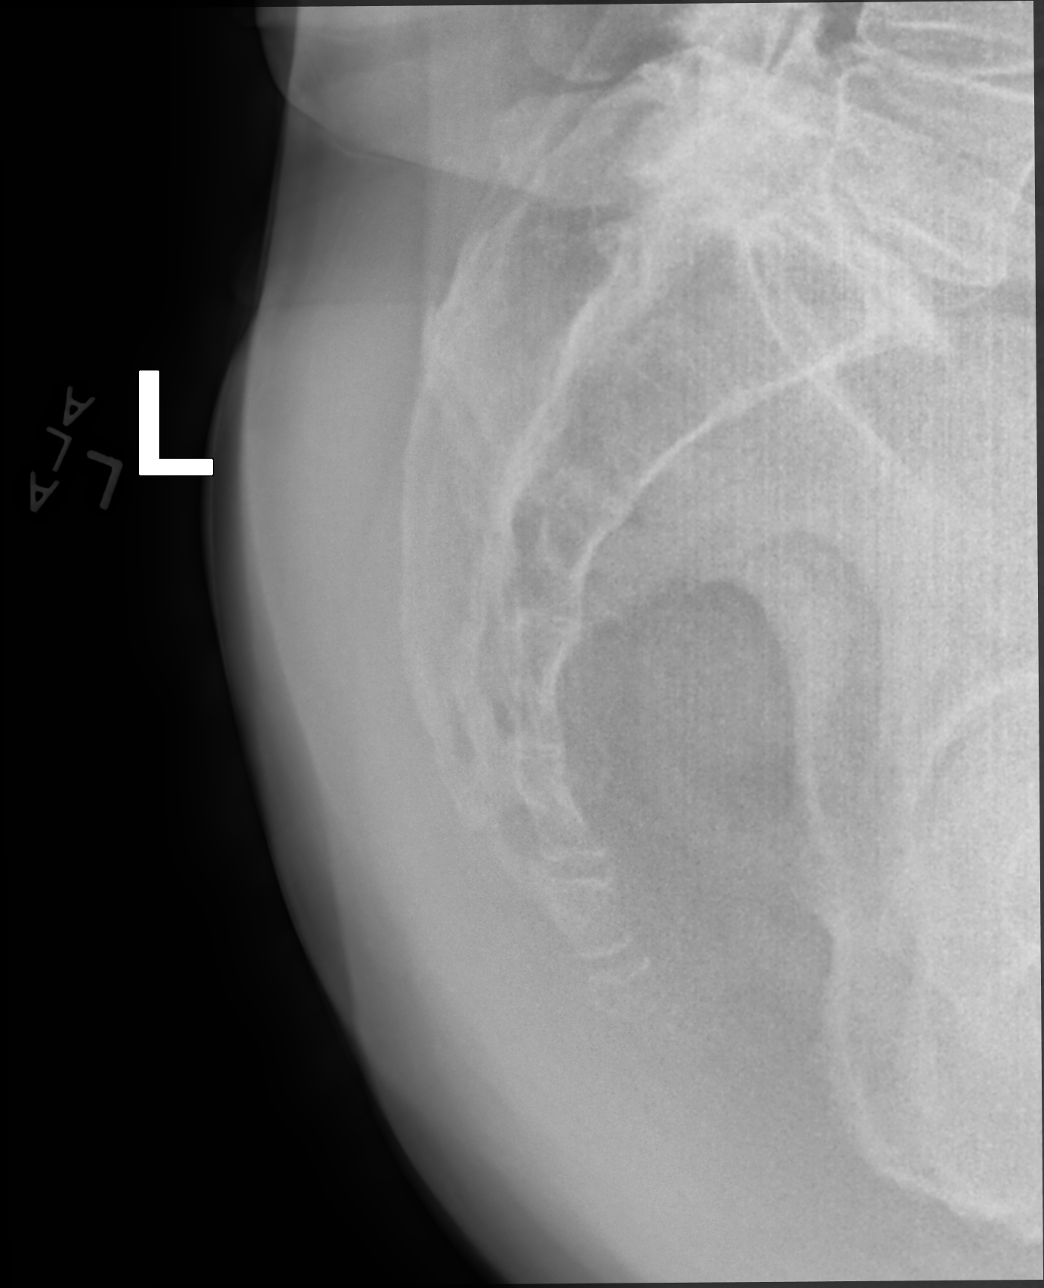

[3 of 3 positions shown; findings below may reference images not displayed]

FINDINGS: The cortical margins of the sacrum are intact. There is no evidence
of fracture or other focal bone lesions. The sacral ala are
maintained. The sacroiliac joints are congruent. No visualized
fracture in the included pelvis.
IMPRESSION: Negative radiographs of the sacrum and coccyx.

## 2022-02-20 ENCOUNTER — Telehealth: Payer: Self-pay | Admitting: Adult Health

## 2022-02-20 NOTE — Telephone Encounter (Signed)
Left message for patient to call back and schedule Medicare Annual Wellness Visit (AWV) either virtually or in office. Left  my [name] number [phone] ? ? ?Last AWV ;11/25/17 ?please schedule at anytime with [facility] Nurse Health Advisor 1 or 2 ? ? ? ?

## 2022-03-03 DIAGNOSIS — M25511 Pain in right shoulder: Secondary | ICD-10-CM | POA: Diagnosis not present

## 2022-03-03 DIAGNOSIS — M19012 Primary osteoarthritis, left shoulder: Secondary | ICD-10-CM | POA: Diagnosis not present

## 2022-03-03 DIAGNOSIS — M25512 Pain in left shoulder: Secondary | ICD-10-CM | POA: Diagnosis not present

## 2022-03-05 ENCOUNTER — Other Ambulatory Visit: Payer: Self-pay | Admitting: Adult Health

## 2022-03-05 DIAGNOSIS — M533 Sacrococcygeal disorders, not elsewhere classified: Secondary | ICD-10-CM

## 2022-03-13 ENCOUNTER — Telehealth: Payer: Self-pay | Admitting: Adult Health

## 2022-03-13 NOTE — Telephone Encounter (Signed)
Left message for patient to call back and schedule Medicare Annual Wellness Visit (AWV) either virtually or in office. Left  my Paul Schwartz number [phone] ? ? ?Last AWV 11/25/17 ?; please schedule at anytime with [facility] Nurse Health Advisor 1 or 2 ? ? ?

## 2022-03-14 ENCOUNTER — Ambulatory Visit (INDEPENDENT_AMBULATORY_CARE_PROVIDER_SITE_OTHER): Payer: Medicare Other

## 2022-03-14 VITALS — Ht 67.0 in | Wt 178.0 lb

## 2022-03-14 DIAGNOSIS — Z0000 Encounter for general adult medical examination without abnormal findings: Secondary | ICD-10-CM | POA: Diagnosis not present

## 2022-03-14 NOTE — Patient Instructions (Addendum)
?[name] , ?Thank you for taking time to come for your Medicare Wellness Visit. I appreciate your ongoing commitment to your health goals. Please review the following plan we discussed and let me know if I can assist you in the future.  ? ?These are the goals we discussed: ? Goals   ? ?   Increase physical activity (pt-stated)   ?   I would like to stay happy and healthy. ?  ? ?  ?  ?This is a list of the screening recommended for you and due dates:  ?Health Maintenance  ?Topic Date Due  ? Zoster (Shingles) Vaccine (2 of 2) 06/14/2022*  ? Flu Shot  06/10/2022  ? Tetanus Vaccine  09/03/2022  ? Pneumonia Vaccine  Completed  ? COVID-19 Vaccine  Completed  ? HPV Vaccine  Aged Out  ?*Topic was postponed. The date shown is not the original due date.  ? ?Advanced directives: Yes Patient will submit copy ? ?Conditions/risks identified: None ? ?Next appointment: Follow up in one year for your annual wellness visit.  ? ? ?Preventive Care 36 Years and Older, Male ?Preventive care refers to lifestyle choices and visits with your health care provider that can promote health and wellness. ?What does preventive care include? ?A yearly physical exam. This is also called an annual well check. ?Dental exams once or twice a year. ?Routine eye exams. Ask your health care provider how often you should have your eyes checked. ?Personal lifestyle choices, including: ?Daily care of your teeth and gums. ?Regular physical activity. ?Eating a healthy diet. ?Avoiding tobacco and drug use. ?Limiting alcohol use. ?Practicing safe sex. ?Taking low doses of aspirin every day. ?Taking vitamin and mineral supplements as recommended by your health care provider. ?What happens during an annual well check? ?The services and screenings done by your health care provider during your annual well check will depend on your age, overall health, lifestyle risk factors, and family history of disease. ?Counseling  ?Your health care provider may ask you  questions about your: ?Alcohol use. ?Tobacco use. ?Drug use. ?Emotional well-being. ?Home and relationship well-being. ?Sexual activity. ?Eating habits. ?History of falls. ?Memory and ability to understand (cognition). ?Work and work Statistician. ?Screening  ?You may have the following tests or measurements: ?Height, weight, and BMI. ?Blood pressure. ?Lipid and cholesterol levels. These may be checked every 5 years, or more frequently if you are over 110 years old. ?Skin check. ?Lung cancer screening. You may have this screening every year starting at age 21 if you have a 30-pack-year history of smoking and currently smoke or have quit within the past 15 years. ?Fecal occult blood test (FOBT) of the stool. You may have this test every year starting at age 94. ?Flexible sigmoidoscopy or colonoscopy. You may have a sigmoidoscopy every 5 years or a colonoscopy every 10 years starting at age 35. ?Prostate cancer screening. Recommendations will vary depending on your family history and other risks. ?Hepatitis C blood test. ?Hepatitis B blood test. ?Sexually transmitted disease (STD) testing. ?Diabetes screening. This is done by checking your blood sugar (glucose) after you have not eaten for a while (fasting). You may have this done every 1-3 years. ?Abdominal aortic aneurysm (AAA) screening. You may need this if you are a current or former smoker. ?Osteoporosis. You may be screened starting at age 74 if you are at high risk. ?Talk with your health care provider about your test results, treatment options, and if necessary, the need for more tests. ?Vaccines  ?  Your health care provider may recommend certain vaccines, such as: ?Influenza vaccine. This is recommended every year. ?Tetanus, diphtheria, and acellular pertussis (Tdap, Td) vaccine. You may need a Td booster every 10 years. ?Zoster vaccine. You may need this after age 64. ?Pneumococcal 13-valent conjugate (PCV13) vaccine. One dose is recommended after age  79. ?Pneumococcal polysaccharide (PPSV23) vaccine. One dose is recommended after age 30. ?Talk to your health care provider about which screenings and vaccines you need and how often you need them. ?This information is not intended to replace advice given to you by your health care provider. Make sure you discuss any questions you have with your health care provider. ?Document Released: 11/23/2015 Document Revised: 07/16/2016 Document Reviewed: 08/28/2015 ?Elsevier Interactive Patient Education ? 2017 [place]. ? ?Fall Prevention in the Home ?Falls can cause injuries. They can happen to people of all ages. There are many things you can do to make your home safe and to help prevent falls. ?What can I do on the outside of my home? ?Regularly fix the edges of walkways and driveways and fix any cracks. ?Remove anything that might make you trip as you walk through a door, such as a raised step or threshold. ?Trim any bushes or trees on the path to your home. ?Use bright outdoor lighting. ?Clear any walking paths of anything that might make someone trip, such as rocks or tools. ?Regularly check to see if handrails are loose or broken. Make sure that both sides of any steps have handrails. ?Any raised decks and porches should have guardrails on the edges. ?Have any leaves, snow, or ice cleared regularly. ?Use sand or salt on walking paths during winter. ?Clean up any spills in your garage right away. This includes oil or grease spills. ?What can I do in the bathroom? ?Use night lights. ?Install grab bars by the toilet and in the tub and shower. Do not use towel bars as grab bars. ?Use non-skid mats or decals in the tub or shower. ?If you need to sit down in the shower, use a plastic, non-slip stool. ?Keep the floor dry. Clean up any water that spills on the floor as soon as it happens. ?Remove soap buildup in the tub or shower regularly. ?Attach bath mats securely with double-sided non-slip rug tape. ?Do not have throw  rugs and other things on the floor that can make you trip. ?What can I do in the bedroom? ?Use night lights. ?Make sure that you have a light by your bed that is easy to reach. ?Do not use any sheets or blankets that are too big for your bed. They should not hang down onto the floor. ?Have a firm chair that has side arms. You can use this for support while you get dressed. ?Do not have throw rugs and other things on the floor that can make you trip. ?What can I do in the kitchen? ?Clean up any spills right away. ?Avoid walking on wet floors. ?Keep items that you use a lot in easy-to-reach places. ?If you need to reach something above you, use a strong step stool that has a grab bar. ?Keep electrical cords out of the way. ?Do not use floor polish or wax that makes floors slippery. If you must use wax, use non-skid floor wax. ?Do not have throw rugs and other things on the floor that can make you trip. ?What can I do with my stairs? ?Do not leave any items on the stairs. ?Make sure that there are  handrails on both sides of the stairs and use them. Fix handrails that are broken or loose. Make sure that handrails are as long as the stairways. ?Check any carpeting to make sure that it is firmly attached to the stairs. Fix any carpet that is loose or worn. ?Avoid having throw rugs at the top or bottom of the stairs. If you do have throw rugs, attach them to the floor with carpet tape. ?Make sure that you have a light switch at the top of the stairs and the bottom of the stairs. If you do not have them, ask someone to add them for you. ?What else can I do to help prevent falls? ?Wear shoes that: ?Do not have high heels. ?Have rubber bottoms. ?Are comfortable and fit you well. ?Are closed at the toe. Do not wear sandals. ?If you use a stepladder: ?Make sure that it is fully opened. Do not climb a closed stepladder. ?Make sure that both sides of the stepladder are locked into place. ?Ask someone to hold it for you, if  possible. ?Clearly mark and make sure that you can see: ?Any grab bars or handrails. ?First and last steps. ?Where the edge of each step is. ?Use tools that help you move around (mobility aids) if they are needed. Terie Purser

## 2022-03-14 NOTE — Progress Notes (Signed)
? ?Subjective:  ? Paul Schwartz is a 81 y.o. male who presents for Medicare Annual/Subsequent preventive examination. ? ?Review of Systems    ?Virtual Visit via Telephone Note ? ?I connected with  Paul Schwartz on 03/14/22 at  1:30 PM EDT by telephone and verified that I am speaking with the correct person using two identifiers. ? ?Location: ?Patient: Home ?Provider: Office ?Persons participating in the virtual visit: patient/Nurse Health Advisor ?  ?I discussed the limitations, risks, security and privacy concerns of performing an evaluation and management service by telephone and the availability of in person appointments. The patient expressed understanding and agreed to proceed. ? ?Interactive audio and video telecommunications were attempted between this nurse and patient, however failed, due to patient having technical difficulties OR patient did not have access to video capability.  We continued and completed visit with audio only. ? ?Some vital signs may be absent or patient reported.  ? ?Criselda Peaches, LPN  ?Cardiac Risk Factors include: advanced age (>33mn, >>10women);hypertension;male gender ? ?   ?Objective:  ?  ?Today's Vitals  ? 03/14/22 1335  ?Weight: 178 lb (80.7 kg)  ?Height: '5\' 7"'$  (1.702 m)  ? ?Body mass index is 27.88 kg/m?. ? ? ?  03/14/2022  ?  1:46 PM 05/01/2016  ? 10:54 AM 04/17/2016  ?  1:10 PM  ?Advanced Directives  ?Does Patient Have a Medical Advance Directive? Yes No;Yes Yes  ?Type of [place] will Living will Living will  ?Does patient want to make changes to medical advance directive? No - Patient declined  No - Patient declined  ?Copy of [place]? No - copy requested  No - copy requested  ?Would patient like information on creating a medical advance directive? No - Patient declined    ? ? ?Current Medications (verified) ?Outpatient Encounter Medications as of 03/14/2022  ?Medication Sig  ? citalopram  (CELEXA) 10 MG tablet Take 1 tablet (10 mg total) by mouth daily.  ? donepezil (ARICEPT) 10 MG tablet Take 1 tablet (10 mg total) by mouth at bedtime.  ? LORazepam (ATIVAN) 1 MG tablet Take 1 tablet (1 mg total) by mouth every 8 (eight) hours as needed for anxiety.  ? Multiple Vitamin (MULTIVITAMIN) tablet Take 1 tablet by mouth daily.  ? Omega-3 Fatty Acids (OMEGA 3 PO) Take 1 capsule by mouth 2 (two) times daily.   ? simvastatin (ZOCOR) 5 MG tablet Take 1 tablet (5 mg total) by mouth daily.  ? ?No facility-administered encounter medications on file as of 03/14/2022.  ? ? ?Allergies (verified) ?Plasticized base [plastibase]  ? ?History: ?Past Medical History:  ?Diagnosis Date  ? Arthritis   ? Bilateral shoulder pain   ? BPH (benign prostatic hyperplasia)   ? GERD (gastroesophageal reflux disease)   ? History of kidney stones   ? Vertigo   ? dizziness  ? ?Past Surgical History:  ?Procedure Laterality Date  ? achilles tendon    ? surgical repair bilat  ? CATARACT EXTRACTION, BILATERAL    ? COLONOSCOPY    ? REVERSE SHOULDER ARTHROPLASTY Right 08/19/2018  ? Procedure: REVERSE RIGHT SHOULDER ARTHROPLASTY;  Surgeon: SJustice Britain MD;  [facility]  Service: Orthopedics;  Laterality: Right;  1253m  ? ?Family History  ?Problem Relation Age of Onset  ? Heart disease Father   ? Colon cancer Neg Hx   ? Esophageal cancer Neg Hx   ? Stomach cancer Neg Hx   ?  Rectal cancer Neg Hx   ? ?Social History  ? ?Socioeconomic History  ? Marital status: Married  ?  Spouse name: Not on file  ? Number of children: Not on file  ? Years of education: Not on file  ? Highest education level: Not on file  ?Occupational History  ? Not on file  ?Tobacco Use  ? Smoking status: Former  ?  Types: Cigarettes  ?  Quit date: 04/07/1968  ?  Years since quitting: 53.9  ? Smokeless tobacco: Never  ?Vaping Use  ? Vaping Use: Never used  ?Substance and Sexual Activity  ? Alcohol use: Yes  ?  Alcohol/week: 4.0 standard drinks  ?  Types: 4 Glasses of wine  per week  ?  Comment: weekends  ? Drug use: No  ? Sexual activity: Yes  ?Other Topics Concern  ? Not on file  ?Social History Narrative  ? Retired   ? Married - 45 years   ? One son that lives in [place]   ? 2 grandchildren   ? ?Social Determinants of Health  ? ?Financial Resource Strain: Low Risk   ? Difficulty of Paying Living Expenses: Not hard at all  ?Food Insecurity: No Food Insecurity  ? Worried About Charity fundraiser in the Last Year: Never true  ? Ran Out of Food in the Last Year: Never true  ?Transportation Needs: No Transportation Needs  ? Lack of Transportation (Medical): No  ? Lack of Transportation (Non-Medical): No  ?Physical Activity: Insufficiently Active  ? Days of Exercise per Week: 2 days  ? Minutes of Exercise per Session: 60 min  ?Stress: No Stress Concern Present  ? Feeling of Stress : Not at all  ?Social Connections: Socially Integrated  ? Frequency of Communication with Friends and Family: More than three times a week  ? Frequency of Social Gatherings with Friends and Family: More than three times a week  ? Attends Religious Services: More than 4 times per year  ? Active Member of Clubs or Organizations: Yes  ? Attends Archivist Meetings: More than 4 times per year  ? Marital Status: Married  ? ? ? ?Clinical Intake: ? ?Pre-visit preparation completed: No ? ?Diabetic?  No ? ?Interpreter Needed?: No ? ?Activities of Daily Living ? ?  03/14/2022  ?  1:45 PM 11/07/2021  ?  9:58 AM  ?In your present state of health, do you have any difficulty performing the following activities:  ?Hearing? 0 0  ?Vision? 0 0  ?Difficulty concentrating or making decisions? 0 0  ?Walking or climbing stairs? 0 0  ?Dressing or bathing? 0 0  ?Doing errands, shopping? 0 0  ?Preparing Food and eating ? N   ?Using the Toilet? N   ?In the past six months, have you accidently leaked urine? N   ?Do you have problems with loss of bowel control? N   ?Managing your Medications? N   ?Managing your Finances? N    ?Housekeeping or managing your Housekeeping? N   ? ? ?Patient Care Team: ?Dorothyann Peng, NP as PCP - General (Family Medicine) ?Sydnee Cabal, MD as Consulting Physician (Orthopedic Surgery) ?Lucas Mallow, MD as Consulting Physician (Urology) ? ?Indicate any recent Medical Services you may have received from other than [facility] providers in the past year (date may be approximate). ? ?   ?Assessment:  ? This is a routine wellness examination for [place]. ? ?Hearing/Vision screen ?Hearing Screening - Comments:: No hearing difficulty ?Vision Screening -  Comments:: Wears reading glassses. Dr Herbert Deaner ? ?Dietary issues and exercise activities discussed: ?Exercise limited by: None identified ? ? Goals Addressed   ? ?  ?  ?  ?  ?  ? This Visit's Progress  ?   Increase physical activity (pt-stated)     ?   I would like to stay happy and healthy. ?  ? ?  ? ?Depression Screen ? ?  03/14/2022  ?  1:42 PM 01/14/2022  ? 11:06 AM 01/08/2021  ?  7:05 AM 08/15/2020  ?  1:04 PM 11/24/2016  ?  8:07 AM 11/02/2015  ?  2:57 PM 10/04/2014  ?  8:15 AM  ?PHQ 2/9 Scores  ?PHQ - 2 Score 0 0 1 0 0 0 1  ?PHQ- 9 Score   2 2     ?  ?Fall Risk ? ?  03/14/2022  ?  1:45 PM 01/14/2022  ? 10:57 AM 01/08/2021  ?  7:05 AM 08/15/2020  ?  1:03 PM 11/24/2016  ?  8:07 AM  ?Fall Risk   ?Falls in the past year? 0 0 0  No  ?Number falls in past yr: 0 0 0 0   ?Injury with Fall? 0 0 0 0   ?Risk for fall due to : No Fall Risks      ? ? ?FALL RISK PREVENTION PERTAINING TO THE HOME: ? ?Any stairs in or around the home? No  ?If so, are there any without handrails? No  ?Home free of loose throw rugs in walkways, pet beds, electrical cords, etc? Yes  ?Adequate lighting in your home to reduce risk of falls? Yes  ? ?ASSISTIVE DEVICES UTILIZED TO PREVENT FALLS: ? ?Life alert? No  ?Use of a cane, walker or w/c? No  ?Grab bars in the bathroom? No  ?Shower chair or bench in shower? No  ?Elevated toilet seat or a handicapped toilet? No  ? ?TIMED UP AND GO: ? ?Was the test performed? No  . Audio Visit ? ?Cognitive Function: ? ?  04/09/2021  ?  3:10 PM  ?MMSE - Mini Mental State Exam  ?Orientation to time 3  ?Orientation to time comments thought the year was 2002 or 2005  ?Orientation to Place 5

## 2022-03-21 DIAGNOSIS — M19012 Primary osteoarthritis, left shoulder: Secondary | ICD-10-CM | POA: Diagnosis not present

## 2022-03-21 DIAGNOSIS — Z96611 Presence of right artificial shoulder joint: Secondary | ICD-10-CM | POA: Diagnosis not present

## 2022-03-28 NOTE — Progress Notes (Signed)
Sent message, via epic in basket, requesting order in epic from surgeon     03/28/22 0930  Preop Orders  Has preop orders? No  Name of staff/physician contacted for orders(Indicate phone or IB message) T. Shuford, PA-C

## 2022-04-04 ENCOUNTER — Encounter (HOSPITAL_COMMUNITY)
Admission: RE | Admit: 2022-04-04 | Discharge: 2022-04-04 | Disposition: A | Discharge status: Home / Self Care | Payer: Medicare Other | Source: Ambulatory Visit | Attending: Orthopedic Surgery | Admitting: Orthopedic Surgery

## 2022-04-04 ENCOUNTER — Encounter (HOSPITAL_COMMUNITY): Payer: Self-pay

## 2022-04-04 ENCOUNTER — Other Ambulatory Visit: Payer: Self-pay

## 2022-04-04 VITALS — BP 143/82 | HR 99 | Temp 97.8°F | Resp 18 | Ht 67.0 in | Wt 177.0 lb

## 2022-04-04 DIAGNOSIS — Z01812 Encounter for preprocedural laboratory examination: Secondary | ICD-10-CM | POA: Insufficient documentation

## 2022-04-04 DIAGNOSIS — Z96612 Presence of left artificial shoulder joint: Secondary | ICD-10-CM | POA: Insufficient documentation

## 2022-04-04 DIAGNOSIS — Z01818 Encounter for other preprocedural examination: Secondary | ICD-10-CM

## 2022-04-04 LAB — CBC
HCT: 38.8 % — ABNORMAL LOW (ref 39.0–52.0)
Hemoglobin: 14.1 g/dL (ref 13.0–17.0)
MCH: 33.8 pg (ref 26.0–34.0)
MCHC: 36.3 g/dL — ABNORMAL HIGH (ref 30.0–36.0)
MCV: 93 fL (ref 80.0–100.0)
Platelets: 245 10*3/uL (ref 150–400)
RBC: 4.17 MIL/uL — ABNORMAL LOW (ref 4.22–5.81)
RDW: 13.6 % (ref 11.5–15.5)
WBC: 7.4 10*3/uL (ref 4.0–10.5)
nRBC: 0 % (ref 0.0–0.2)

## 2022-04-04 LAB — SURGICAL PCR SCREEN
MRSA, PCR: NEGATIVE
Staphylococcus aureus: NEGATIVE

## 2022-04-04 NOTE — Patient Instructions (Addendum)
DUE TO COVID-19 ONLY TWO VISITORS  (aged 82 and older)  ARE ALLOWED TO COME WITH YOU AND STAY IN THE WAITING ROOM ONLY DURING PRE OP AND PROCEDURE.   **NO VISITORS ARE ALLOWED IN THE SHORT STAY AREA OR RECOVERY ROOM!!**  IF YOU WILL BE ADMITTED INTO THE HOSPITAL YOU ARE ALLOWED ONLY FOUR SUPPORT PEOPLE DURING VISITATION HOURS ONLY (7 AM -8PM)   The support person(s) must pass our screening, gel in and out, and wear a mask at all times, including in the patient's room. Patients must also wear a mask when staff or their support person are in the room. Visitors GUEST BADGE MUST BE WORN VISIBLY  One adult visitor may remain with you overnight and MUST be in the room by 8 P.M.     Your procedure is scheduled on: 04/10/22   Report to [facility] Main Entrance    Report to admitting at 12:15 PM   Call this number if you have problems the morning of surgery [phone]   Do not eat food :After Midnight.   After Midnight you may have the following liquids until _12:00 PM DAY OF SURGERY  Water Black Coffee (sugar ok, NO MILK/CREAM OR CREAMERS)  Tea (sugar ok, NO MILK/CREAM OR CREAMERS) regular and decaf                             Plain Jell-O (NO RED)                                           Fruit ices (not with fruit pulp, NO RED)                                     Popsicles (NO RED)                                                                  Juice: apple, WHITE grape, WHITE cranberry Sports drinks like Gatorade (NO RED) Clear broth(vegetable,chicken,beef)                    The day of surgery:  Drink ONE (1) Pre-Surgery Clear Ensure 11:45 at AM the morning of surgery. Drink in one sitting. Do not sip.  This drink was given to you during your hospital  pre-op appointment visit. Nothing else to drink after completing the  Pre-Surgery Clear Ensure 12:00 PM          If you have questions, please contact your surgeon's office.       Oral Hygiene is also important to  reduce your risk of infection.                                    Remember - BRUSH YOUR TEETH THE MORNING OF SURGERY WITH YOUR REGULAR TOOTHPASTE   Do NOT smoke after Midnight   Take these medicines the morning of surgery with A SIP OF WATER: Celexa, Simvastatin  You may not have any metal on your body including  jewelry, and body piercing             Do not wear lotions, powders, cologne, or deodorant              Men may shave face and neck.   Do not bring valuables to the hospital. [place].   Contacts, dentures or bridgework may not be worn into surgery.     Patients discharged on the day of surgery will not be allowed to drive home.  Someone NEEDS to stay with you for the first 24 hours after anesthesia.   Special Instructions: Bring a copy of your healthcare power of attorney and living will documents the day of surgery if you haven't scanned them before.              Please read over the following fact sheets you were given: IF YOU HAVE QUESTIONS ABOUT YOUR PRE-OP INSTRUCTIONS PLEASE CALL [facility]- Preparing for Total Shoulder Arthroplasty    Before surgery, you can play an important role. Because skin is not sterile, your skin needs to be as free of germs as possible. You can reduce the number of germs on your skin by using the following products. Benzoyl Peroxide Gel Reduces the number of germs present on the skin Applied twice a day to shoulder area starting two days before surgery    ==================================================================  Please follow these instructions carefully:  BENZOYL PEROXIDE 5% GEL  Please do not use if you have an allergy to benzoyl peroxide.   If your skin becomes reddened/irritated stop using the benzoyl peroxide.  Starting two days before surgery, apply as follows: Apply benzoyl peroxide in the morning and at night. Apply  after taking a shower. If you are not taking a shower clean entire shoulder front, back, and side along with the armpit with a clean wet washcloth.  Place a quarter-sized dollop on your shoulder and rub in thoroughly, making sure to cover the front, back, and side of your shoulder, along with the armpit.   (5/30)2 days before ____ AM   ____ PM             (5/31) 1 day before ____ AM   ____ PM                         Do this twice a day for two days.  (Last application is the night before surgery, AFTER using the CHG soap as described below).  Do NOT apply benzoyl peroxide gel on the day of surgery.      [facility] - Preparing for Surgery Before surgery, you can play an important role.  Because skin is not sterile, your skin needs to be as free of germs as possible.  You can reduce the number of germs on your skin by washing with CHG (chlorahexidine gluconate) soap before surgery.  CHG is an antiseptic cleaner which kills germs and bonds with the skin to continue killing germs even after washing. Please DO NOT use if you have an allergy to CHG or antibacterial soaps.  If your skin becomes reddened/irritated stop using the CHG and inform your nurse when you arrive at Short Stay.  You may shave your face/neck. Please follow these instructions carefully:  1.  Shower with CHG  Soap the night before surgery and the  morning of Surgery.  2.  If you choose to wash your hair, wash your hair first as usual with your  normal  shampoo.  3.  After you shampoo, rinse your hair and body thoroughly to remove the  shampoo.                            4.  Use CHG as you would any other liquid soap.  You can apply chg directly  to the skin and wash                       Gently with a scrungie or clean washcloth.  5.  Apply the CHG Soap to your body ONLY FROM THE NECK DOWN.   Do not use on face/ open                           Wound or open sores. Avoid contact with eyes, ears mouth and genitals (private parts).                        Wash face,  Genitals (private parts) with your normal soap.             6.  Wash thoroughly, paying special attention to the area where your surgery  will be performed.  7.  Thoroughly rinse your body with warm water from the neck down.  8.  DO NOT shower/wash with your normal soap after using and rinsing off  the CHG Soap.                9.  Pat yourself dry with a clean towel.            10.  Wear clean pajamas.            11.  Place clean sheets on your bed the night of your first shower and do not  sleep with pets. Day of Surgery : Do not apply any lotions/deodorants the morning of surgery.  Please wear clean clothes to the hospital/surgery center.  FAILURE TO FOLLOW THESE INSTRUCTIONS MAY RESULT IN THE CANCELLATION OF YOUR SURGERY    ________________________________________________________________________   Incentive Spirometer  An incentive spirometer is a tool that can help keep your lungs clear and active. This tool measures how well you are filling your lungs with each breath. Taking long deep breaths may help reverse or decrease the chance of developing breathing (pulmonary) problems (especially infection) following: A long period of time when you are unable to move or be active. BEFORE THE PROCEDURE  If the spirometer includes an indicator to show your best effort, your nurse or respiratory therapist will set it to a desired goal. If possible, sit up straight or lean slightly forward. Try not to slouch. Hold the incentive spirometer in an upright position. INSTRUCTIONS FOR USE  Sit on the edge of your bed if possible, or sit up as far as you can in bed or on a chair. Hold the incentive spirometer in an upright position. Breathe out normally. Place the mouthpiece in your mouth and seal your lips tightly around it. Breathe in slowly and as deeply as possible, raising the piston or the ball toward the top of the column. Hold your breath for 3-5 seconds or for as  long as possible. Allow the piston  or ball to fall to the bottom of the column. Remove the mouthpiece from your mouth and breathe out normally. Rest for a few seconds and repeat Steps 1 through 7 at least 10 times every 1-2 hours when you are awake. Take your time and take a few normal breaths between deep breaths. The spirometer may include an indicator to show your best effort. Use the indicator as a goal to work toward during each repetition. After each set of 10 deep breaths, practice coughing to be sure your lungs are clear. If you have an incision (the cut made at the time of surgery), support your incision when coughing by placing a pillow or rolled up towels firmly against it. Once you are able to get out of bed, walk around indoors and cough well. You may stop using the incentive spirometer when instructed by your caregiver.  RISKS AND COMPLICATIONS Take your time so you do not get dizzy or light-headed. If you are in pain, you may need to take or ask for pain medication before doing incentive spirometry. It is harder to take a deep breath if you are having pain. AFTER USE Rest and breathe slowly and easily. It can be helpful to keep track of a log of your progress. Your caregiver can provide you with a simple table to help with this. If you are using the spirometer at home, follow these instructions: [place] IF:  You are having difficultly using the spirometer. You have trouble using the spirometer as often as instructed. Your pain medication is not giving enough relief while using the spirometer. You develop fever of 100.5 F (38.1 C) or higher. SEEK IMMEDIATE MEDICAL CARE IF:  You cough up bloody sputum that had not been present before. You develop fever of 102 F (38.9 C) or greater. You develop worsening pain at or near the incision site. MAKE SURE YOU:  Understand these instructions. Will watch your condition. Will get help right away if you are not doing well or  get worse. Document Released: 03/09/2007 Document Revised: 01/19/2012 Document Reviewed: 05/10/2007 [facility] Patient Information 2014 [place], [place].   ________________________________________________________________________

## 2022-04-04 NOTE — Progress Notes (Signed)
Anesthesia note:  Bowel prep reminder:NA  PCP - Dorothyann Peng NP Cardiologist -none Other-   Chest x-ray - no EKG - no Stress Test - no ECHO - no Cardiac Cath - NA  Pacemaker/ICD device last checked:NA  Sleep Study - no CPAP -   Pt is pre diabetic-NA Fasting Blood Sugar -  Checks Blood Sugar _____  Blood Thinner:NA Blood Thinner Instructions: Aspirin Instructions: Last Dose:  Anesthesia review: no  Patient denies shortness of breath, fever, cough and chest pain at PAT appointment Pt has no SOB with any activities.  Patient verbalized understanding of instructions that were given to them at the PAT appointment. Patient was also instructed that they will need to review over the PAT instructions again at home before surgery. His wife was with him to help with language defecit

## 2022-04-10 ENCOUNTER — Encounter (HOSPITAL_COMMUNITY): Payer: Self-pay | Admitting: Orthopedic Surgery

## 2022-04-10 ENCOUNTER — Ambulatory Visit (HOSPITAL_BASED_OUTPATIENT_CLINIC_OR_DEPARTMENT_OTHER): Payer: Medicare Other | Admitting: Anesthesiology

## 2022-04-10 ENCOUNTER — Ambulatory Visit (HOSPITAL_COMMUNITY): Payer: Medicare Other | Admitting: Anesthesiology

## 2022-04-10 ENCOUNTER — Encounter (HOSPITAL_COMMUNITY)
Admission: RE | Disposition: A | Discharge status: Home / Self Care | Payer: Self-pay | Source: Home / Self Care | Attending: Orthopedic Surgery

## 2022-04-10 ENCOUNTER — Ambulatory Visit (HOSPITAL_COMMUNITY)
Admission: RE | Admit: 2022-04-10 | Discharge: 2022-04-11 | Disposition: A | Discharge status: Home / Self Care | Payer: Medicare Other | Attending: Orthopedic Surgery | Admitting: Orthopedic Surgery

## 2022-04-10 ENCOUNTER — Other Ambulatory Visit: Payer: Self-pay

## 2022-04-10 DIAGNOSIS — Z96651 Presence of right artificial knee joint: Secondary | ICD-10-CM | POA: Diagnosis not present

## 2022-04-10 DIAGNOSIS — Z96612 Presence of left artificial shoulder joint: Secondary | ICD-10-CM

## 2022-04-10 DIAGNOSIS — M75102 Unspecified rotator cuff tear or rupture of left shoulder, not specified as traumatic: Secondary | ICD-10-CM | POA: Diagnosis not present

## 2022-04-10 DIAGNOSIS — M12812 Other specific arthropathies, not elsewhere classified, left shoulder: Secondary | ICD-10-CM

## 2022-04-10 DIAGNOSIS — G8918 Other acute postprocedural pain: Secondary | ICD-10-CM | POA: Diagnosis not present

## 2022-04-10 DIAGNOSIS — Z87891 Personal history of nicotine dependence: Secondary | ICD-10-CM | POA: Diagnosis not present

## 2022-04-10 DIAGNOSIS — M19012 Primary osteoarthritis, left shoulder: Secondary | ICD-10-CM | POA: Diagnosis not present

## 2022-04-10 DIAGNOSIS — F419 Anxiety disorder, unspecified: Secondary | ICD-10-CM | POA: Insufficient documentation

## 2022-04-10 DIAGNOSIS — I10 Essential (primary) hypertension: Secondary | ICD-10-CM | POA: Insufficient documentation

## 2022-04-10 DIAGNOSIS — Z01818 Encounter for other preprocedural examination: Secondary | ICD-10-CM

## 2022-04-10 HISTORY — PX: REVERSE SHOULDER ARTHROPLASTY: SHX5054

## 2022-04-10 SURGERY — ARTHROPLASTY, SHOULDER, TOTAL, REVERSE
Anesthesia: Regional | Site: Shoulder | Laterality: Left

## 2022-04-10 MED ORDER — ONDANSETRON HCL 4 MG/2ML IJ SOLN
INTRAMUSCULAR | Status: DC | PRN
  Administered 2022-04-10: 4 mg via INTRAVENOUS

## 2022-04-10 MED ORDER — METOCLOPRAMIDE HCL 5 MG/ML IJ SOLN: 5.0000 mg | Freq: Three times a day (TID) | INTRAMUSCULAR | Status: DC | PRN

## 2022-04-10 MED ORDER — ORAL CARE MOUTH RINSE: 15.0000 mL | Freq: Once | OROMUCOSAL | Status: AC

## 2022-04-10 MED ORDER — ONDANSETRON HCL 4 MG PO TABS: 4.0000 mg | ORAL_TABLET | Freq: Four times a day (QID) | ORAL | Status: DC | PRN

## 2022-04-10 MED ORDER — TRAMADOL HCL 50 MG PO TABS: 50.0000 mg | ORAL_TABLET | Freq: Four times a day (QID) | ORAL | 0 refills | Status: DC | PRN

## 2022-04-10 MED ORDER — PANTOPRAZOLE SODIUM 40 MG PO TBEC: 40.0000 mg | DELAYED_RELEASE_TABLET | Freq: Every day | ORAL | Status: DC

## 2022-04-10 MED ORDER — OXYCODONE HCL 5 MG PO TABS
10.0000 mg | ORAL_TABLET | ORAL | Status: DC | PRN
  Administered 2022-04-10 – 2022-04-11 (×2): 10 mg via ORAL
  Filled 2022-04-10 (×2): qty 2

## 2022-04-10 MED ORDER — OXYCODONE HCL 5 MG PO TABS: 5.0000 mg | ORAL_TABLET | ORAL | Status: DC | PRN

## 2022-04-10 MED ORDER — BUPIVACAINE HCL (PF) 0.5 % IJ SOLN
INTRAMUSCULAR | Status: DC | PRN
  Administered 2022-04-10: 15 mL via PERINEURAL

## 2022-04-10 MED ORDER — LACTATED RINGERS IV BOLUS: 250.0000 mL | Freq: Once | INTRAVENOUS | Status: DC

## 2022-04-10 MED ORDER — DOCUSATE SODIUM 100 MG PO CAPS
100.0000 mg | ORAL_CAPSULE | Freq: Two times a day (BID) | ORAL | Status: DC
  Administered 2022-04-10: 100 mg via ORAL
  Filled 2022-04-10: qty 1

## 2022-04-10 MED ORDER — BUPIVACAINE LIPOSOME 1.3 % IJ SUSP
INTRAMUSCULAR | Status: DC | PRN
  Administered 2022-04-10: 10 mL via PERINEURAL

## 2022-04-10 MED ORDER — VANCOMYCIN HCL 1000 MG IV SOLR
INTRAVENOUS | Status: AC
  Filled 2022-04-10: qty 20

## 2022-04-10 MED ORDER — LACTATED RINGERS IV SOLN: INTRAVENOUS | Status: DC

## 2022-04-10 MED ORDER — ONDANSETRON HCL 4 MG/2ML IJ SOLN: 4.0000 mg | Freq: Four times a day (QID) | INTRAMUSCULAR | Status: DC | PRN

## 2022-04-10 MED ORDER — DIPHENHYDRAMINE HCL 12.5 MG/5ML PO ELIX: 12.5000 mg | ORAL_SOLUTION | ORAL | Status: DC | PRN

## 2022-04-10 MED ORDER — PHENOL 1.4 % MT LIQD: 1.0000 | OROMUCOSAL | Status: DC | PRN

## 2022-04-10 MED ORDER — ALUM & MAG HYDROXIDE-SIMETH 200-200-20 MG/5ML PO SUSP: 30.0000 mL | ORAL | Status: DC | PRN

## 2022-04-10 MED ORDER — FENTANYL CITRATE (PF) 100 MCG/2ML IJ SOLN
INTRAMUSCULAR | Status: DC | PRN
  Administered 2022-04-10: 50 ug via INTRAVENOUS

## 2022-04-10 MED ORDER — LIDOCAINE 2% (20 MG/ML) 5 ML SYRINGE
INTRAMUSCULAR | Status: DC | PRN
  Administered 2022-04-10: 40 mg via INTRAVENOUS

## 2022-04-10 MED ORDER — FENTANYL CITRATE PF 50 MCG/ML IJ SOSY
50.0000 ug | PREFILLED_SYRINGE | INTRAMUSCULAR | Status: AC
  Administered 2022-04-10: 50 ug via INTRAVENOUS
  Filled 2022-04-10: qty 2

## 2022-04-10 MED ORDER — FENTANYL CITRATE PF 50 MCG/ML IJ SOSY
25.0000 ug | PREFILLED_SYRINGE | INTRAMUSCULAR | Status: DC | PRN
  Administered 2022-04-10: 50 ug via INTRAVENOUS

## 2022-04-10 MED ORDER — TRANEXAMIC ACID-NACL 1000-0.7 MG/100ML-% IV SOLN
1000.0000 mg | INTRAVENOUS | Status: AC
  Administered 2022-04-10: 1000 mg via INTRAVENOUS
  Filled 2022-04-10: qty 100

## 2022-04-10 MED ORDER — METOCLOPRAMIDE HCL 5 MG PO TABS: 5.0000 mg | ORAL_TABLET | Freq: Three times a day (TID) | ORAL | Status: DC | PRN

## 2022-04-10 MED ORDER — LACTATED RINGERS IV BOLUS: 500.0000 mL | Freq: Once | INTRAVENOUS | Status: DC

## 2022-04-10 MED ORDER — DOCUSATE SODIUM 100 MG PO CAPS: 100.0000 mg | ORAL_CAPSULE | Freq: Two times a day (BID) | ORAL | Status: DC

## 2022-04-10 MED ORDER — 0.9 % SODIUM CHLORIDE (POUR BTL) OPTIME
TOPICAL | Status: DC | PRN
  Administered 2022-04-10: 1000 mL

## 2022-04-10 MED ORDER — ROCURONIUM BROMIDE 10 MG/ML (PF) SYRINGE
PREFILLED_SYRINGE | INTRAVENOUS | Status: DC | PRN
  Administered 2022-04-10: 60 mg via INTRAVENOUS
  Administered 2022-04-10: 20 mg via INTRAVENOUS

## 2022-04-10 MED ORDER — SUGAMMADEX SODIUM 200 MG/2ML IV SOLN
INTRAVENOUS | Status: DC | PRN
  Administered 2022-04-10: 200 mg via INTRAVENOUS

## 2022-04-10 MED ORDER — ACETAMINOPHEN 10 MG/ML IV SOLN
1000.0000 mg | Freq: Once | INTRAVENOUS | Status: DC | PRN
  Administered 2022-04-10: 1000 mg via INTRAVENOUS

## 2022-04-10 MED ORDER — OXYCODONE-ACETAMINOPHEN 5-325 MG PO TABS: 1.0000 | ORAL_TABLET | ORAL | 0 refills | Status: DC | PRN

## 2022-04-10 MED ORDER — POLYETHYLENE GLYCOL 3350 17 G PO PACK: 17.0000 g | PACK | Freq: Every day | ORAL | Status: DC | PRN

## 2022-04-10 MED ORDER — ACETAMINOPHEN 325 MG PO TABS: 325.0000 mg | ORAL_TABLET | Freq: Four times a day (QID) | ORAL | Status: DC | PRN

## 2022-04-10 MED ORDER — PROPOFOL 10 MG/ML IV BOLUS
INTRAVENOUS | Status: DC | PRN
  Administered 2022-04-10: 170 mg via INTRAVENOUS

## 2022-04-10 MED ORDER — FENTANYL CITRATE (PF) 100 MCG/2ML IJ SOLN
INTRAMUSCULAR | Status: AC
  Filled 2022-04-10: qty 2

## 2022-04-10 MED ORDER — METHOCARBAMOL 500 MG PO TABS
500.0000 mg | ORAL_TABLET | Freq: Four times a day (QID) | ORAL | Status: DC | PRN
  Administered 2022-04-10: 500 mg via ORAL
  Filled 2022-04-10: qty 1

## 2022-04-10 MED ORDER — MENTHOL 3 MG MT LOZG: 1.0000 | LOZENGE | OROMUCOSAL | Status: DC | PRN

## 2022-04-10 MED ORDER — BISACODYL 5 MG PO TBEC: 5.0000 mg | DELAYED_RELEASE_TABLET | Freq: Every day | ORAL | Status: DC | PRN

## 2022-04-10 MED ORDER — METHOCARBAMOL 1000 MG/10ML IJ SOLN: 500.0000 mg | Freq: Four times a day (QID) | INTRAVENOUS | Status: DC | PRN

## 2022-04-10 MED ORDER — HYDROMORPHONE HCL 1 MG/ML IJ SOLN: 0.5000 mg | INTRAMUSCULAR | Status: DC | PRN

## 2022-04-10 MED ORDER — CITALOPRAM HYDROBROMIDE 20 MG PO TABS: 10.0000 mg | ORAL_TABLET | Freq: Every day | ORAL | Status: DC

## 2022-04-10 MED ORDER — CHLORHEXIDINE GLUCONATE 0.12 % MT SOLN
15.0000 mL | Freq: Once | OROMUCOSAL | Status: AC
  Administered 2022-04-10: 15 mL via OROMUCOSAL

## 2022-04-10 MED ORDER — CEFAZOLIN SODIUM-DEXTROSE 2-4 GM/100ML-% IV SOLN
2.0000 g | INTRAVENOUS | Status: AC
  Administered 2022-04-10: 2 g via INTRAVENOUS
  Filled 2022-04-10: qty 100

## 2022-04-10 MED ORDER — VANCOMYCIN HCL 1000 MG IV SOLR
INTRAVENOUS | Status: DC | PRN
  Administered 2022-04-10: 1000 mg via TOPICAL

## 2022-04-10 MED ORDER — PROPOFOL 10 MG/ML IV BOLUS
INTRAVENOUS | Status: AC
  Filled 2022-04-10: qty 20

## 2022-04-10 MED ORDER — ACETAMINOPHEN 10 MG/ML IV SOLN
INTRAVENOUS | Status: AC
  Filled 2022-04-10: qty 100

## 2022-04-10 MED ORDER — DONEPEZIL HCL 10 MG PO TABS
10.0000 mg | ORAL_TABLET | Freq: Every day | ORAL | Status: DC
  Administered 2022-04-10: 10 mg via ORAL
  Filled 2022-04-10: qty 1

## 2022-04-10 MED ORDER — PHENYLEPHRINE 80 MCG/ML (10ML) SYRINGE FOR IV PUSH (FOR BLOOD PRESSURE SUPPORT)
PREFILLED_SYRINGE | INTRAVENOUS | Status: DC | PRN
  Administered 2022-04-10 (×2): 80 ug via INTRAVENOUS

## 2022-04-10 MED ORDER — FENTANYL CITRATE PF 50 MCG/ML IJ SOSY
PREFILLED_SYRINGE | INTRAMUSCULAR | Status: AC
  Administered 2022-04-10: 50 ug via INTRAVENOUS
  Filled 2022-04-10: qty 2

## 2022-04-10 MED ORDER — PHENYLEPHRINE HCL-NACL 20-0.9 MG/250ML-% IV SOLN
INTRAVENOUS | Status: DC | PRN
  Administered 2022-04-10: 40 ug/min via INTRAVENOUS

## 2022-04-10 MED ORDER — DEXAMETHASONE SODIUM PHOSPHATE 10 MG/ML IJ SOLN
INTRAMUSCULAR | Status: DC | PRN
  Administered 2022-04-10: 8 mg via INTRAVENOUS

## 2022-04-10 MED ORDER — STERILE WATER FOR IRRIGATION IR SOLN
Status: DC | PRN
  Administered 2022-04-10: 2000 mL

## 2022-04-10 SURGICAL SUPPLY — 73 items
BAG COUNTER SPONGE SURGICOUNT (BAG) IMPLANT
BAG ZIPLOCK 12X15 (MISCELLANEOUS) ×2 IMPLANT
BLADE SAW SGTL 83.5X18.5 (BLADE) ×2 IMPLANT
BNDG COHESIVE 4X5 TAN ST LF (GAUZE/BANDAGES/DRESSINGS) ×2 IMPLANT
COOLER ICEMAN CLASSIC (MISCELLANEOUS) ×2 IMPLANT
COVER BACK TABLE 60X90IN (DRAPES) ×2 IMPLANT
COVER SURGICAL LIGHT HANDLE (MISCELLANEOUS) ×2 IMPLANT
CUP SUT UNIV REVERS 42 NEUT (Shoulder) ×1 IMPLANT
DERMABOND ADVANCED (GAUZE/BANDAGES/DRESSINGS) ×1
DERMABOND ADVANCED .7 DNX12 (GAUZE/BANDAGES/DRESSINGS) ×1 IMPLANT
DRAPE INCISE IOBAN 66X45 STRL (DRAPES) IMPLANT
DRAPE ORTHO SPLIT 77X108 STRL (DRAPES) ×4
DRAPE SHEET LG 3/4 BI-LAMINATE (DRAPES) ×2 IMPLANT
DRAPE SURG 17X11 SM STRL (DRAPES) ×2 IMPLANT
DRAPE SURG ORHT 6 SPLT 77X108 (DRAPES) ×2 IMPLANT
DRAPE TOP 10253 STERILE (DRAPES) ×2 IMPLANT
DRAPE U-SHAPE 47X51 STRL (DRAPES) ×2 IMPLANT
DRESSING AQUACEL AG SP 3.5X6 (GAUZE/BANDAGES/DRESSINGS) ×1 IMPLANT
DRSG AQUACEL AG ADV 3.5X10 (GAUZE/BANDAGES/DRESSINGS) IMPLANT
DRSG AQUACEL AG SP 3.5X6 (GAUZE/BANDAGES/DRESSINGS) ×2
DRSG TEGADERM 8X12 (GAUZE/BANDAGES/DRESSINGS) ×2 IMPLANT
DURAPREP 26ML APPLICATOR (WOUND CARE) ×2 IMPLANT
ELECT BLADE TIP CTD 4 INCH (ELECTRODE) ×2 IMPLANT
ELECT PENCIL ROCKER SW 15FT (MISCELLANEOUS) ×2 IMPLANT
ELECT REM PT RETURN 15FT ADLT (MISCELLANEOUS) ×2 IMPLANT
FACESHIELD WRAPAROUND (MASK) ×8 IMPLANT
FACESHIELD WRAPAROUND OR TEAM (MASK) ×4 IMPLANT
GLENOID SYS 42 +4 LAT/24 SHLDR (Miscellaneous) ×1 IMPLANT
GLENOID UNI REV MOD 24 +2 LAT (Joint) ×1 IMPLANT
GLOVE BIO SURGEON STRL SZ7.5 (GLOVE) ×2 IMPLANT
GLOVE BIO SURGEON STRL SZ8 (GLOVE) ×2 IMPLANT
GLOVE SS BIOGEL STRL SZ 7 (GLOVE) ×1 IMPLANT
GLOVE SS BIOGEL STRL SZ 7.5 (GLOVE) ×1 IMPLANT
GLOVE SUPERSENSE BIOGEL SZ 7 (GLOVE) ×1
GLOVE SUPERSENSE BIOGEL SZ 7.5 (GLOVE) ×1
GOWN STRL REIN XL XLG (GOWN DISPOSABLE) ×5 IMPLANT
INSERT HUMERAL L/42+3 IN 42CUP (Shoulder) ×1 IMPLANT
KIT BASIN OR (CUSTOM PROCEDURE TRAY) ×2 IMPLANT
KIT TURNOVER KIT A (KITS) ×1 IMPLANT
MANIFOLD NEPTUNE II (INSTRUMENTS) ×2 IMPLANT
NDL TAPERED W/ NITINOL LOOP (MISCELLANEOUS) ×1 IMPLANT
NEEDLE TAPERED W/ NITINOL LOOP (MISCELLANEOUS) ×2 IMPLANT
NS IRRIG 1000ML POUR BTL (IV SOLUTION) ×2 IMPLANT
PACK SHOULDER (CUSTOM PROCEDURE TRAY) ×2 IMPLANT
PAD ARMBOARD 7.5X6 YLW CONV (MISCELLANEOUS) ×2 IMPLANT
PAD COLD SHLDR WRAP-ON (PAD) ×2 IMPLANT
PIN NITINOL TARGETER 2.8 (PIN) IMPLANT
PIN SET MODULAR GLENOID SYSTEM (PIN) ×1 IMPLANT
RESTRAINT HEAD UNIVERSAL NS (MISCELLANEOUS) ×2 IMPLANT
SCREW CENTRAL MOD 30MM (Screw) ×1 IMPLANT
SCREW PERI LOCK 5.5X24 (Screw) ×1 IMPLANT
SCREW PERIPHERAL 5.5X20 LOCK (Screw) ×1 IMPLANT
SCREW PERIPHERAL 5.5X28 LOCK (Screw) ×1 IMPLANT
SCREW PERIPHERAL 5.5X40 LOCK (Screw) ×1 IMPLANT
SLING ARM FOAM STRAP LRG (SOFTGOODS) ×1 IMPLANT
SLING ARM FOAM STRAP MED (SOFTGOODS) IMPLANT
SPACER SHLD UNI REV 42 +6 (Spacer) ×1 IMPLANT
SPONGE T-LAP 4X18 ~~LOC~~+RFID (SPONGE) ×2 IMPLANT
STEM HUM UNIV REV SZ11 (Stem) ×1 IMPLANT
SUCTION FRAZIER HANDLE 12FR (TUBING) ×2
SUCTION TUBE FRAZIER 12FR DISP (TUBING) ×1 IMPLANT
SUT FIBERWIRE #2 38 T-5 BLUE (SUTURE)
SUT MNCRL AB 3-0 PS2 18 (SUTURE) ×2 IMPLANT
SUT MON AB 2-0 CT1 36 (SUTURE) ×2 IMPLANT
SUT VIC AB 1 CT1 36 (SUTURE) ×2 IMPLANT
SUTURE FIBERWR #2 38 T-5 BLUE (SUTURE) IMPLANT
SUTURE TAPE 1.3 40 TPR END (SUTURE) ×2 IMPLANT
SUTURETAPE 1.3 40 TPR END (SUTURE) ×4
TOWEL OR 17X26 10 PK STRL BLUE (TOWEL DISPOSABLE) ×2 IMPLANT
TOWEL OR NON WOVEN STRL DISP B (DISPOSABLE) ×2 IMPLANT
TUBE SUCTION HIGH CAP CLEAR NV (SUCTIONS) ×2 IMPLANT
WATER STERILE IRR 1000ML POUR (IV SOLUTION) ×4 IMPLANT
YANKAUER SUCT BULB TIP 10FT TU (MISCELLANEOUS) ×1 IMPLANT

## 2022-04-10 NOTE — Op Note (Signed)
04/10/2022  4:26 PM  PATIENT:   Paul Schwartz  81 y.o. male  PRE-OPERATIVE DIAGNOSIS:  Left shoulder rotator cuff tear arthropathy  POST-OPERATIVE DIAGNOSIS: Same  PROCEDURE: Left shoulder reverse arthroplasty utilizing a press-fit size 11 Arthrex stem with a neutral metaphysis, +6 spacer, +3 polyethylene insert, 42/+4 glenosphere and a small/+2 baseplate  SURGEON:  Saja Bartolini, Metta Clines M.D.  ASSISTANTS: Jenetta Loges, PA-C  ANESTHESIA:   General endotracheal and interscalene block with Exparel rel  EBL: 200 cc  SPECIMEN: None  Drains: None   PATIENT DISPOSITION:  PACU - hemodynamically stable.    PLAN OF CARE: Admit for overnight observation  Brief history:  Patient is an 81 year old gentleman with a long history of progressively increasing left shoulder pain related to severe arthritis and significant rotator cuff dysfunction.  He is well-known to our practice after a previous right shoulder reverse arthroplasty which she has done very well with.  Due to his increasing functional imitations and failure to respond to prolonged attempts at conservative management he is now brought to the operating room for planned left shoulder reverse arthroplasty.  Preoperatively, I counseled the patient regarding treatment options and risks versus benefits thereof.  Possible surgical complications were all reviewed including potential for bleeding, infection, neurovascular injury, persistent pain, loss of motion, anesthetic complication, failure of the implant, and possible need for additional surgery. They understand and accept and agrees with our planned procedure.   Procedure in detail:  After undergoing routine preop evaluation the patient received prophylactic antibiotics and interscalene block with Exparel was established in the holding area by the anesthesia department.  Subsequently placed spine on the operating table and underwent the smooth induction of a general endotracheal  anesthesia.  Placed into the beachchair position and appropriately padded and protected.  The left shoulder girdle region was sterilely prepped and draped in standard fashion.  Timeout was called.  A deltopectoral approach to the left shoulder is made an approximately 10 cm incision.  Skin flaps were elevated dissection carried deeply and the deltopectoral interval was then developed from proximal to distal with the vein taken laterally.  Conjoined tendon mobilized and retracted medially.  Long head bicep tendon was then tenodesed at the upper border the pectoralis major tendon with the proximal segment then unroofed and excised.  The rotator cuff was split from the apex of the bicipital groove to the base of the coracoid and the subscapularis was then separated from the lesser tuberosity using electrocautery.  The margin was then tagged with a pair of grasping suture tape sutures.  Capsular attachments were then divided from the anterior and inferior margins of the humeral neck and the humeral head was then delivered through the wound.  An extra medullary guide was then used to outline our proposed humeral head resection and this was then perform with an oscillating saw at approximately 20 degrees of retroversion.  Large marginal osteophytes were then removed with a rondure.  Humeral head was estimated size large for 42 glenosphere.  Metal cap was then placed over the cut proximal humeral surface we then exposed the glenoid with appropriate retractors.  A circumferential labral resection was then performed.  As we were attempting to place the central guidepin it became clear that exposure was extremely difficult and so we returned our attention to the humeral metaphysis and used the oscillating saw to resect an additional 2 mm of bone.  With this completed we able to appropriately expose the glenoid and a guidepin was then  directed into the center of the glenoid with an approximate 10 degree inferior tilt and the  glenoid was then reamed with the central followed by the peripheral reamer for the 42 glenosphere.  Preparation then completed with the central drill and tapped for a 30 mm lag screw.  Our baseplate was then assembled and the baseplate was then inserted with vancomycin powder applied to the threads of the lag screw achieving excellent purchase and fixation.  All of the peripheral locking screws were then placed using standard technique.  A 42/+4 glenosphere was then impacted onto the baseplate and the central locking screw was placed.  Attention returned to the proximal humerus where the canal was opened and we ultimately broached up to a size 11 at approximately 20 degrees of retroversion.  A neutral metaphyseal reaming guide was placed to prepare the metaphysis.  Trial implant was then placed showing good motion good stability good soft tissue balance.  This point the trial was removed the final implant was assembled the humeral canal was irrigated cleaned and dried and vancomycin powder placed.  The final implant was then seated with excellent purchase and fixation.  A series of trial reductions was then performed and we ultimately felt that the best soft tissue balance was achieved with a +6 spacer and a +3 poly-.  Trials were then removed.  The final spacer was placed and the final poly was impacted.  Final reduction showed excellent motion good stability good soft tissue balance all much to our satisfaction.  The wound was then copiously irrigated.  Final hemostasis was obtained.  I should mention there was some compromise of the cephalic vein so this was ligated at the proximal distal aspect of the incision.  The subscapularis was then repaired back to the metaphysis utilizing the eyelets on the collar of the implant.  The balance of the vancomycin powder was then spread deep through the soft tissue layers.  The deltopectoral interval was reapproximated with a series of figure-of-eight and 1 Vicryl sutures.   2-0 Monocryl used to close the subcu layer and intracuticular 3-0 Monocryl used to close the skin followed by Dermabond and Aquacel dressing.  The left arm was then placed into a sling and the patient was awakened, extubated, and taken to the recovery room in stable condition.  Jenetta Loges, PA-C was utilized as an Environmental consultant throughout this case, essential for help with positioning the patient, positioning extremity, tissue manipulation, implantation of the prosthesis, suture management, wound closure, and intraoperative decision-making.  Marin Shutter MD  Contact # [phone]

## 2022-04-10 NOTE — Progress Notes (Signed)
AssistedDr. Jana Half with left, interscalene  block. Side rails up, monitors on throughout procedure. See vital signs in flow sheet. Tolerated Procedure well.

## 2022-04-10 NOTE — Anesthesia Procedure Notes (Signed)
Anesthesia Regional Block: Interscalene brachial plexus block   Pre-Anesthetic Checklist: , timeout performed,  Correct Patient, Correct Site, Correct Laterality,  Correct Procedure, Correct Position, site marked,  Risks and benefits discussed,  Surgical consent,  Pre-op evaluation,  At surgeon's request and post-op pain management  Laterality: Left  Prep: Dura Prep       Needles:  Injection technique: Single-shot  Needle Type: Echogenic Stimulator Needle     Needle Length: 5cm  Needle Gauge: 20     Additional Needles:   Procedures:,,,, ultrasound used (permanent image in chart),,    Narrative:  Start time: 04/10/2022 2:16 PM End time: 04/10/2022 2:20 PM Injection made incrementally with aspirations every 5 mL.  Performed by: Personally  Anesthesiologist: Darral Dash, DO  Additional Notes: Patient identified. Risks/Benefits/Options discussed with patient including but not limited to bleeding, infection, nerve damage, failed block, incomplete pain control. Patient expressed understanding and wished to proceed. All questions were answered. Sterile technique was used throughout the entire procedure. Please see nursing notes for vital signs. Aspirated in 5cc intervals with injection for negative confirmation. Patient was given instructions on fall risk and not to get out of bed. All questions and concerns addressed with instructions to call with any issues or inadequate analgesia.

## 2022-04-10 NOTE — Anesthesia Procedure Notes (Signed)
Procedure Name: Intubation Date/Time: 04/10/2022 3:06 PM Performed by: Montel Clock, CRNA Pre-anesthesia Checklist: Patient identified, Emergency Drugs available, Suction available, Patient being monitored and Timeout performed Patient Re-evaluated:Patient Re-evaluated prior to induction Oxygen Delivery Method: Circle system utilized Preoxygenation: Pre-oxygenation with 100% oxygen Induction Type: IV induction Ventilation: Mask ventilation without difficulty and Oral airway inserted - appropriate to patient size Laryngoscope Size: Mac and 3 Grade View: Grade I Tube type: Oral Tube size: 7.5 mm Number of attempts: 1 Airway Equipment and Method: Stylet Placement Confirmation: ETT inserted through vocal cords under direct vision, positive ETCO2 and breath sounds checked- equal and bilateral Secured at: 23 cm Tube secured with: Tape Dental Injury: Teeth and Oropharynx as per pre-operative assessment

## 2022-04-10 NOTE — Anesthesia Preprocedure Evaluation (Addendum)
Anesthesia Evaluation  Patient identified by MRN, date of birth, ID band Patient awake    Reviewed: Allergy & Precautions, NPO status , Patient's Chart, lab work & pertinent test results  Airway Mallampati: II  TM Distance: >3 FB Neck ROM: Full    Dental no notable dental hx.    Pulmonary neg pulmonary ROS, former smoker,    Pulmonary exam normal        Cardiovascular hypertension,  Rhythm:Regular Rate:Normal     Neuro/Psych Anxiety negative neurological ROS     GI/Hepatic Neg liver ROS, GERD  ,  Endo/Other  negative endocrine ROS  Renal/GU negative Renal ROS  negative genitourinary   Musculoskeletal  (+) Arthritis , Osteoarthritis,    Abdominal Normal abdominal exam  (+)   Peds  Hematology negative hematology ROS (+)   Anesthesia Other Findings   Reproductive/Obstetrics                             Anesthesia Physical Anesthesia Plan  ASA: 2  Anesthesia Plan: General and Regional   Post-op Pain Management: Regional block*   Induction: Intravenous  PONV Risk Score and Plan: 2 and Ondansetron, Dexamethasone and Treatment may vary due to age or medical condition  Airway Management Planned: Mask and Oral ETT  Additional Equipment: None  Intra-op Plan:   Post-operative Plan: Extubation in OR  Informed Consent: I have reviewed the patients History and Physical, chart, labs and discussed the procedure including the risks, benefits and alternatives for the proposed anesthesia with the patient or authorized representative who has indicated his/her understanding and acceptance.     Dental advisory given  Plan Discussed with: CRNA  Anesthesia Plan Comments:         Anesthesia Quick Evaluation

## 2022-04-10 NOTE — H&P (Signed)
Paul Schwartz    Chief Complaint: Left shoulder rotator cuff tear arthropathy HPI: The patient is a 81 y.o. male with chronic and progressively increasing left shoulder pain related to severe rotator cuff tear arthropathy.  He is well-known to our practice after a previous right shoulder reverse arthroplasty, and presents today for planned left shoulder reverse arthroplasty.  Past Medical History:  Diagnosis Date   Arthritis    Bilateral shoulder pain    BPH (benign prostatic hyperplasia)    GERD (gastroesophageal reflux disease)    History of kidney stones       Past Surgical History:  Procedure Laterality Date   achilles tendon Right    surgical repair bilat   CATARACT EXTRACTION, BILATERAL     COLONOSCOPY     REVERSE SHOULDER ARTHROPLASTY Right 08/19/2018   Procedure: REVERSE RIGHT SHOULDER ARTHROPLASTY;  Surgeon: Justice Britain, MD;  Location: [place];  Service: Orthopedics;  Laterality: Right;  13mn    Family History  Problem Relation Age of Onset   Heart disease Father    Colon cancer Neg Hx    Esophageal cancer Neg Hx    Stomach cancer Neg Hx    Rectal cancer Neg Hx     Social History:  reports that he quit smoking about 54 years ago. His smoking use included cigarettes. He has a 10.00 pack-year smoking history. He has never used smokeless tobacco. He reports current alcohol use of about 4.0 standard drinks per week. He reports that he does not use drugs.  BMI: Estimated body mass index is 27.72 kg/m as calculated from the following:   Height as of this encounter: '5\' 7"'$  (1.702 m).   Weight as of this encounter: 80.3 kg.  Lab Results  Component Value Date   ALBUMIN 4.3 01/14/2022   Diabetes: Patient does not have a diagnosis of diabetes.     Smoking Status: Social History   Tobacco Use  Smoking Status Former   Packs/day: 1.00   Years: 10.00   Pack years: 10.00   Types: Cigarettes   Quit date: 04/07/1968   Years since quitting: 54.0  Smokeless  Tobacco Never   The patient is not currently a tobacco user. Counseling given: Not Answered      Medications Prior to Admission  Medication Sig Dispense Refill   acetaminophen (TYLENOL) 650 MG CR tablet Take 1,300 mg by mouth every 8 (eight) hours as needed for pain.     citalopram (CELEXA) 10 MG tablet Take 1 tablet (10 mg total) by mouth daily. 90 tablet 1   diclofenac Sodium (VOLTAREN) 1 % GEL Apply 1 application. topically 4 (four) times daily as needed (pain).     donepezil (ARICEPT) 10 MG tablet Take 1 tablet (10 mg total) by mouth at bedtime. 90 tablet 3   Omega-3 Fatty Acids (FISH OIL TRIPLE STRENGTH) 1400 MG CAPS Take 1,400 mg by mouth daily.     simvastatin (ZOCOR) 5 MG tablet Take 1 tablet (5 mg total) by mouth daily. 90 tablet 3     Physical Exam: Left shoulder demonstrates profoundly restricted and painful motion as noted at his recent office visits.  He has globally decreased strength to manual muscle testing.  Otherwise neurovascular intact.  Plain radiographs confirm changes consistent with chronic rotator cuff tear arthropathy.  Vitals  Temp:  [97.5 F (36.4 C)] 97.5 F (36.4 C) (06/01 1248) Pulse Rate:  [86] 86 (06/01 1248) Resp:  [15] 15 (06/01 1248) BP: (162)/(93) 162/93 (06/01 1248) SpO2:  [  99 %] 99 % (06/01 1248) Weight:  [80.3 kg] 80.3 kg (06/01 1303)  Assessment/Plan  Impression: Left shoulder rotator cuff tear arthropathy  Plan of Action: Procedure(s): REVERSE SHOULDER ARTHROPLASTY  Lynasia Meloche M Ric Rosenberg 04/10/2022, 1:58 PM Contact # [phone]

## 2022-04-10 NOTE — Discharge Instructions (Signed)

## 2022-04-10 NOTE — Evaluation (Signed)
Occupational Therapy Evaluation Patient Details Name: Paul Schwartz MRN: [id number] DOB: 07-08-1941 Today's Date: 04/10/2022   History of Present Illness Patient is an 81 year old man s/p left reverse TSA   Clinical Impression   [name] is an 81 year old man who presents today for reverse total shoulder arthroplasty. Patient seen in pre-op for education purposes. Patient verbalizes understanding but per chart review patient has a history of cognitive impairment and unsure if patient can retain information. Therapist located patient's spouse and also went over exercises, precautions, positioning, donning upper extremity clothing and bathing while maintaining shoulder precautions, and sling wear schedule. Patient's spouse Marcela verbalized understanding of instruction and appeared intimidated of education as well as concerned for patient's safety and abilities s/p surgery. She asks therapist if "he could spend the night." Therapist informed spouse that decision is up to MD. Patient and spouse would benefit from further OT services while in hospital to perform actual ADLs, sling and ice machine training before discharge home.  Will follow acutely if admitted.   Recommendations for follow up therapy are one component of a multi-disciplinary discharge planning process, led by the attending physician.  Recommendations may be updated based on patient status, additional functional criteria and insurance authorization.   Follow Up Recommendations  Follow physician's recommendations for discharge plan and follow up therapies    Assistance Recommended at Discharge Frequent or constant Supervision/Assistance  Patient can return home with the following A lot of help with bathing/dressing/bathroom;Assistance with cooking/housework;Direct supervision/assist for medications management;Direct supervision/assist for financial management    Functional Status Assessment  Patient has had a recent  decline in their functional status and demonstrates the ability to make significant improvements in function in a reasonable and predictable amount of time.  Equipment Recommendations  None recommended by OT    Recommendations for Other Services       Precautions / Restrictions Precautions Precautions: Shoulder Type of Shoulder Precautions: If sitting in controlled environment, ok to come out of sling to give neck a break. Please sleep in it to protect until follow up in office.     OK to use operative arm for feeding, hygiene and ADLs.   Ok to instruct Pendulums and lap slides as exercises. Ok to use operative arm within the following parameters for ADL purposes     New ROM (8/18)   Ok for PROM, AAROM, AROM within pain tolerance and within the following ROM   ER 20   ABD 45   FE 60 Shoulder Interventions: Shoulder sling/immobilizer;Off for dressing/bathing/exercises Precaution Booklet Issued:  (handouts) Required Braces or Orthoses: Sling Restrictions Weight Bearing Restrictions: Yes LUE Weight Bearing: Non weight bearing      Mobility Bed Mobility                    Transfers                          Balance                                           ADL either performed or assessed with clinical judgement   ADL  Vision Patient Visual Report: No change from baseline       Perception     Praxis      Pertinent Vitals/Pain Pain Assessment Pain Assessment: No/denies pain     Hand Dominance Right   Extremity/Trunk Assessment Upper Extremity Assessment Upper Extremity Assessment: LUE deficits/detail;RUE deficits/detail RUE Deficits / Details: hx of reverse TSA RUE Sensation: WNL RUE Coordination: WNL LUE Deficits / Details: WFL ROM, reports pain with movement LUE Sensation: WNL LUE Coordination: WNL       Cervical / Trunk Assessment Cervical / Trunk  Assessment: Normal   Communication Communication Communication: No difficulties   Cognition Arousal/Alertness: Awake/alert Behavior During Therapy: WFL for tasks assessed/performed Overall Cognitive Status: History of cognitive impairments - at baseline                                 General Comments: Hx of impaired cognition - memory deficits.     General Comments       Exercises     Shoulder Instructions Shoulder Instructions Donning/doffing shirt without moving shoulder:  (Instructed on compensatory strategies with patient and then spouse in the waiting room.) Method for sponge bathing under operated UE:  (Instructed on compensatory strategy.) Donning/doffing sling/immobilizer: Caregiver independent with task Correct positioning of sling/immobilizer: Caregiver independent with task Pendulum exercises (written home exercise program):  (Patient verbalized understanding) ROM for elbow, wrist and digits of operated UE: Independent Sling wearing schedule (on at all times/off for ADL's): Independent Proper positioning of operated UE when showering: Independent Dressing change: Independent Positioning of UE while sleeping: [place] expects to be discharged to:: Private residence Living Arrangements: Spouse/significant other Available Help at Discharge: Family;Available 24 hours/day Type of Home: House Home Access: Stairs to enter [employer] of Steps: 1 Entrance Stairs-Rails: None Home Layout: One level     Bathroom Shower/Tub: Occupational psychologist: Standard                Prior Functioning/Environment                 ADLs Comments: needs some assist        OT Problem List: Decreased strength;Decreased range of motion;Pain;Impaired UE functional use      OT Treatment/Interventions: Self-care/ADL training;Therapeutic exercise;Therapeutic activities;Patient/family education    OT  Goals(Current goals can be found in the care plan section) Acute Rehab OT Goals Patient Stated Goal: for patient to be safe at home OT Goal Formulation: With family Time For Goal Achievement: 04/24/22 Potential to Achieve Goals: Good  OT Frequency: Min 2X/week    Co-evaluation              AM-PAC OT "6 Clicks" Daily Activity     Outcome Measure Help from another person eating meals?: A Little Help from another person taking care of personal grooming?: A Little Help from another person toileting, which includes using toliet, bedpan, or urinal?: A Little Help from another person bathing (including washing, rinsing, drying)?: A Little Help from another person to put on and taking off regular upper body clothing?: A Lot Help from another person to put on and taking off regular lower body clothing?: A Lot 6 Click Score: 16   End of Session Nurse Communication:  (okay to see.)  Activity Tolerance: Patient tolerated treatment well Patient left: in bed  OT Visit Diagnosis: Muscle weakness (generalized) (M62.81)  Time: 8416-6063 OT Time Calculation (min): 19 min Charges:  OT General Charges $OT Visit: 1 Visit OT Evaluation $OT Eval Low Complexity: 1 Low  Andrej Spagnoli, OTR/L [place]  Office [phone] Pager: [place] 04/10/2022, 2:43 PM

## 2022-04-10 NOTE — Transfer of Care (Signed)
Immediate Anesthesia Transfer of Care Note  Patient: Paul Schwartz  Procedure(s) Performed: REVERSE SHOULDER ARTHROPLASTY (Left: Shoulder)  Patient Location: PACU  Anesthesia Type:GA combined with regional for post-op pain  Level of Consciousness: drowsy and patient cooperative  Airway & Oxygen Therapy: Patient Spontanous Breathing and Patient connected to face mask oxygen  Post-op Assessment: Report given to RN and Post -op Vital signs reviewed and stable  Post vital signs: Reviewed and stable  Last Vitals:  Vitals Value Taken Time  BP 159/93 04/10/22 1653  Temp    Pulse 76 04/10/22 1656  Resp 16 04/10/22 1656  SpO2 100 % 04/10/22 1656  Vitals shown include unvalidated device data.  Last Pain:  Vitals:   04/10/22 1303  TempSrc:   PainSc: 7       Patients Stated Pain Goal: 4 (75/17/00 1749)  Complications: No notable events documented.

## 2022-04-11 ENCOUNTER — Other Ambulatory Visit: Payer: Self-pay

## 2022-04-11 ENCOUNTER — Encounter (HOSPITAL_COMMUNITY): Payer: Self-pay | Admitting: Orthopedic Surgery

## 2022-04-11 DIAGNOSIS — Z96651 Presence of right artificial knee joint: Secondary | ICD-10-CM | POA: Diagnosis not present

## 2022-04-11 DIAGNOSIS — M75102 Unspecified rotator cuff tear or rupture of left shoulder, not specified as traumatic: Secondary | ICD-10-CM | POA: Diagnosis not present

## 2022-04-11 DIAGNOSIS — Z87891 Personal history of nicotine dependence: Secondary | ICD-10-CM | POA: Diagnosis not present

## 2022-04-11 DIAGNOSIS — I10 Essential (primary) hypertension: Secondary | ICD-10-CM | POA: Diagnosis not present

## 2022-04-11 NOTE — Discharge Summary (Signed)
PATIENT ID:      Paul Schwartz  MRN:     [id number] DOB/AGE:    12-28-1940 / 81 y.o.     DISCHARGE SUMMARY  ADMISSION DATE:    04/10/2022 DISCHARGE DATE:  04/11/2022  ADMISSION DIAGNOSIS: Left shoulder rotator cuff tear arthropathy Past Medical History:  Diagnosis Date   Arthritis    Bilateral shoulder pain    BPH (benign prostatic hyperplasia)    GERD (gastroesophageal reflux disease)    History of kidney stones     DISCHARGE DIAGNOSIS:   Principal Problem:   S/P reverse total shoulder arthroplasty, left   PROCEDURE: Procedure(s): REVERSE SHOULDER ARTHROPLASTY on 04/10/2022  CONSULTS:    HISTORY:  See H&P in chart.  HOSPITAL COURSE:  Paul Schwartz is a 81 y.o. admitted on 04/10/2022 with a diagnosis of Left shoulder rotator cuff tear arthropathy.  Paul Schwartz were brought to the operating room on 04/10/2022 and underwent Procedure(s): [name].    Paul Schwartz were given perioperative antibiotics:  Anti-infectives (From admission, onward)    Start     Dose/Rate Route Frequency Ordered Stop   04/10/22 1526  vancomycin (VANCOCIN) powder  Status:  Discontinued          As needed 04/10/22 1526 04/10/22 1822   04/10/22 1300  ceFAZolin (ANCEF) IVPB 2g/100 mL premix        2 g 200 mL/hr over 30 Minutes Intravenous On call to O.R. 04/10/22 1242 04/10/22 1522     .  Patient underwent the above named procedure and tolerated it well. The following day Paul Schwartz were hemodynamically stable and pain was controlled on oral analgesics. Paul Schwartz were neurovascularly intact to the operative extremity. OT was ordered and worked with patient per protocol. Paul Schwartz were medically and orthopaedically stable for discharge on day1.    DIAGNOSTIC STUDIES:  RECENT RADIOGRAPHIC STUDIES :  No results found.  RECENT VITAL SIGNS:  Patient Vitals for the past 24 hrs:  BP Temp Temp src Pulse Resp SpO2 Height Weight  04/11/22 0557 107/71 97.8 F (36.6 C) Oral 82 18 99 % -- --  04/11/22 0145 120/77  98.4 F (36.9 C) Oral 81 16 97 % -- --  04/10/22 2101 122/79 99 F (37.2 C) Oral 96 16 96 % -- --  04/10/22 1825 (!) 153/95 98 F (36.7 C) Oral 71 16 100 % -- --  04/10/22 1800 (!) 147/85 -- -- 68 16 99 % -- --  04/10/22 1745 (!) 145/85 -- -- 66 14 99 % -- --  04/10/22 1740 -- -- -- 66 12 99 % -- --  04/10/22 1730 (!) 149/86 -- -- 72 16 91 % -- --  04/10/22 1655 (!) 159/93 98.2 F (36.8 C) -- 79 17 100 % -- --  04/10/22 1418 130/86 -- -- 68 16 100 % -- --  04/10/22 1413 -- -- -- 75 18 97 % -- --  04/10/22 1408 -- -- -- 73 15 96 % -- --  04/10/22 1403 -- -- -- 71 20 94 % -- --  04/10/22 1358 -- -- -- 73 18 98 % -- --  04/10/22 1303 -- -- -- -- -- -- '5\' 7"'$  (1.702 m) 80.3 kg  04/10/22 1248 (!) 162/93 (!) 97.5 F (36.4 C) Oral 86 15 99 % -- --  .  RECENT EKG RESULTS:    Orders placed or performed in visit on 10/04/14   EKG 12-Lead    DISCHARGE INSTRUCTIONS:    DISCHARGE MEDICATIONS:   Allergies  as of 04/11/2022       Reactions   Plasticized Base [plastibase] Hives, Rash        Medication List     TAKE these medications    acetaminophen 650 MG CR tablet Commonly known as: TYLENOL Take 1,300 mg by mouth every 8 (eight) hours as needed for pain.   citalopram 10 MG tablet Commonly known as: CELEXA Take 1 tablet (10 mg total) by mouth daily.   diclofenac Sodium 1 % Gel Commonly known as: VOLTAREN Apply 1 application. topically 4 (four) times daily as needed (pain).   donepezil 10 MG tablet Commonly known as: ARICEPT Take 1 tablet (10 mg total) by mouth at bedtime.   Fish Oil Triple Strength 1400 MG Caps Take 1,400 mg by mouth daily.   oxyCODONE-acetaminophen 5-325 MG tablet Commonly known as: Percocet Take 1 tablet by mouth every 4 (four) hours as needed (max 6 q, for severe pain not controlled by tramadol).   simvastatin 5 MG tablet Commonly known as: ZOCOR Take 1 tablet (5 mg total) by mouth daily.   traMADol 50 MG tablet Commonly known as: ULTRAM Take  1 tablet (50 mg total) by mouth every 6 (six) hours as needed for moderate pain.        FOLLOW UP VISIT:    Follow-up Information     Justice Britain, MD Follow up.   Specialty: Orthopedic Surgery Why: 04-23-2022 at 2:00 PM for post-op Contact information: [address] [place] [address] [phone]                 DISCHARGE TO: Home   DISCHARGE CONDITION:  [name] Filimon Miranda for Dr. Justice Britain 04/11/2022, 7:25 AM

## 2022-04-11 NOTE — Progress Notes (Signed)
Transition of Care [facility]) Screening Note  Patient Details  Name: KEN BONN Date of Birth: 1940/12/29  Transition of Care [facility]) CM/SW Contact:    Sherie Don, LCSW Phone Number: 04/11/2022, 9:22 AM  Transition of Care Department [facility]) has reviewed patient and no TOC needs have been identified at this time. We will continue to monitor patient advancement through interdisciplinary progression rounds. If new patient transition needs arise, please place a TOC consult.

## 2022-04-11 NOTE — Plan of Care (Signed)
  Problem: Acute Rehab OT Goals (only OT should resolve) Goal: OT Additional ADL Goal #1 Outcome: Adequate for Discharge   Problem: Education: Goal: Knowledge of General Education information will improve Description: Including pain rating scale, medication(s)/side effects and non-pharmacologic comfort measures Outcome: Adequate for Discharge   Problem: Health Behavior/Discharge Planning: Goal: Ability to manage health-related needs will improve Outcome: Adequate for Discharge   Problem: Clinical Measurements: Goal: Ability to maintain clinical measurements within normal limits will improve Outcome: Adequate for Discharge Goal: Will remain free from infection Outcome: Adequate for Discharge Goal: Diagnostic test results will improve Outcome: Adequate for Discharge Goal: Respiratory complications will improve Outcome: Adequate for Discharge Goal: Cardiovascular complication will be avoided Outcome: Adequate for Discharge   Problem: Activity: Goal: Risk for activity intolerance will decrease Outcome: Adequate for Discharge   Problem: Nutrition: Goal: Adequate nutrition will be maintained Outcome: Adequate for Discharge   Problem: Coping: Goal: Level of anxiety will decrease Outcome: Adequate for Discharge   Problem: Elimination: Goal: Will not experience complications related to bowel motility Outcome: Adequate for Discharge Goal: Will not experience complications related to urinary retention Outcome: Adequate for Discharge   Problem: Pain Managment: Goal: General experience of comfort will improve Outcome: Adequate for Discharge   Problem: Safety: Goal: Ability to remain free from injury will improve Outcome: Adequate for Discharge   Problem: Skin Integrity: Goal: Risk for impaired skin integrity will decrease Outcome: Adequate for Discharge   Problem: Education: Goal: Knowledge of the prescribed therapeutic regimen will improve Outcome: Adequate for  Discharge Goal: Understanding of activity limitations/precautions following surgery will improve Outcome: Adequate for Discharge Goal: Individualized Educational Video(s) Outcome: Adequate for Discharge   Problem: Activity: Goal: Ability to tolerate increased activity will improve Outcome: Adequate for Discharge   Problem: Pain Management: Goal: Pain level will decrease with appropriate interventions Outcome: Adequate for Discharge    Discharged home with wife. Teaching done and written information given.

## 2022-04-11 NOTE — Progress Notes (Signed)
Occupational Therapy Treatment Patient Details Name: Paul Schwartz MRN: [id number] DOB: 03-20-1941 Today's Date: 04/11/2022   History of present illness Patient is an 81 year old man s/p left reverse TSA   OT comments  Patient presents POD 1 after shoulder surgery.Therapist provided education and instruction to patient and spouse in regards to exercises, precautions, positioning, donning upper extremity clothing and bathing while maintaining shoulder precautions, ice and edema management and donning/doffing sling. Patient and spouse verbalized understanding and demonstrated as needed. Patient needed assistance to donn shirt, underwear, pants, socks and shoes and provided with instruction on compensatory strategies to perform ADLs. Handouts provided to maximize retention of education. Patient to follow up with MD for further therapy needs.     Recommendations for follow up therapy are one component of a multi-disciplinary discharge planning process, led by the attending physician.  Recommendations may be updated based on patient status, additional functional criteria and insurance authorization.    Follow Up Recommendations  Follow physician's recommendations for discharge plan and follow up therapies    Assistance Recommended at Discharge Frequent or constant Supervision/Assistance  Patient can return home with the following  A lot of help with bathing/dressing/bathroom;Assistance with cooking/housework;Direct supervision/assist for medications management;Assist for transportation;Direct supervision/assist for financial management   Equipment Recommendations  None recommended by OT    Recommendations for Other Services      Precautions / Restrictions Precautions Precautions: Shoulder Type of Shoulder Precautions: If sitting in controlled environment, ok to come out of sling to give neck a break. Please sleep in it to protect until follow up in office.     OK to use operative arm for  feeding, hygiene and ADLs.   Ok to instruct Pendulums and lap slides as exercises. Ok to use operative arm within the following parameters for ADL purposes     New ROM (8/18)   Ok for PROM, AAROM, AROM within pain tolerance and within the following ROM   ER 20   ABD 45   FE 60 Shoulder Interventions: Shoulder sling/immobilizer;Off for dressing/bathing/exercises Precaution Booklet Issued:  (handouts) Required Braces or Orthoses: Sling Restrictions Weight Bearing Restrictions: Yes LUE Weight Bearing: Non weight bearing       Mobility Bed Mobility Overal bed mobility: Needs Assistance Bed Mobility: Supine to Sit     Supine to sit: Min assist          Transfers                         Balance Overall balance assessment: Mild deficits observed, not formally tested                                         ADL either performed or assessed with clinical judgement   ADL Overall ADL's : Needs assistance/impaired                 Upper Body Dressing : Maximal assistance;Sitting   Lower Body Dressing: Maximal assistance;Sit to/from stand   Toilet Transfer: Min guard;Ambulation;Regular Toilet   Toileting- Water quality scientist and Hygiene: Sit to/from stand;Maximal assistance Toileting - Clothing Manipulation Details (indicate cue type and reason): needs max assistance for clothing management - wearing jeans and belt       General ADL Comments: Reiterated all education during dressing and mobility in room.    Extremity/Trunk Assessment  Vision Patient Visual Report: No change from baseline     Perception     Praxis      Cognition Arousal/Alertness: Awake/alert Behavior During Therapy: WFL for tasks assessed/performed Overall Cognitive Status: History of cognitive impairments - at baseline                                 General Comments: Hx of impaired cognition - memory deficits.        Exercises       Shoulder Instructions Shoulder Instructions Donning/doffing shirt without moving shoulder: Caregiver independent with task Method for sponge bathing under operated UE: Caregiver independent with task Donning/doffing sling/immobilizer: Caregiver independent with task Correct positioning of sling/immobilizer: Caregiver independent with task Pendulum exercises (written home exercise program): Caregiver independent with task ROM for elbow, wrist and digits of operated UE: Caregiver independent with task Sling wearing schedule (on at all times/off for ADL's): Caregiver independent with task Proper positioning of operated UE when showering: Caregiver independent with task Dressing change: Caregiver independent with task Positioning of UE while sleeping: Caregiver independent with task     General Comments      Pertinent Vitals/ Pain       Pain Assessment Pain Assessment: Faces Faces Pain Scale: Hurts a little bit Pain Location: L shoulder Pain Descriptors / Indicators: Grimacing Pain Intervention(s): Monitored during session  Home Living                                          Prior Functioning/Environment              Frequency           Progress Toward Goals  OT Goals(current goals can now be found in the care plan section)  Progress towards OT goals: Goals met/education completed, patient discharged from OT     Plan All goals met and education completed, patient discharged from OT services    Co-evaluation                 AM-PAC OT "6 Clicks" Daily Activity     Outcome Measure   Help from another person eating meals?: A Little Help from another person taking care of personal grooming?: A Little Help from another person toileting, which includes using toliet, bedpan, or urinal?: A Little Help from another person bathing (including washing, rinsing, drying)?: A Lot Help from another person to put on and taking off regular upper  body clothing?: A Lot Help from another person to put on and taking off regular lower body clothing?: A Lot 6 Click Score: 15    End of Session    OT Visit Diagnosis: Muscle weakness (generalized) (M62.81)   Activity Tolerance Patient tolerated treatment well   Patient Left in chair;with call bell/phone within reach;with family/visitor present   Nurse Communication  (OT education complete)        Time: 9444-6190 OT Time Calculation (min): 31 min  Charges: OT General Charges $OT Visit: 1 Visit OT Treatments $Self Care/Home Management : 23-37 mins  Tykera Skates, OTR/L [place]  Office [phone] Pager: [phone]   Lenward Chancellor 04/11/2022, 12:08 PM

## 2022-04-11 NOTE — Anesthesia Postprocedure Evaluation (Signed)
Anesthesia Post Note  Patient: Paul Schwartz  Procedure(s) Performed: REVERSE SHOULDER ARTHROPLASTY (Left: Shoulder)     Patient location during evaluation: PACU Anesthesia Type: Regional and General Level of consciousness: awake and alert Pain management: pain level controlled Vital Signs Assessment: post-procedure vital signs reviewed and stable Respiratory status: spontaneous breathing, nonlabored ventilation, respiratory function stable and patient connected to nasal cannula oxygen Cardiovascular status: blood pressure returned to baseline and stable Postop Assessment: no apparent nausea or vomiting Anesthetic complications: no   No notable events documented.  Last Vitals:  Vitals:   04/11/22 0145 04/11/22 0557  BP: 120/77 107/71  Pulse: 81 82  Resp: 16 18  Temp: 36.9 C 36.6 C  SpO2: 97% 99%    Last Pain:  Vitals:   04/11/22 0557  TempSrc: Oral  PainSc:                  [name]

## 2022-04-23 DIAGNOSIS — Z4789 Encounter for other orthopedic aftercare: Secondary | ICD-10-CM | POA: Diagnosis not present

## 2022-05-12 DIAGNOSIS — M25512 Pain in left shoulder: Secondary | ICD-10-CM | POA: Diagnosis not present

## 2022-05-16 DIAGNOSIS — M25512 Pain in left shoulder: Secondary | ICD-10-CM | POA: Diagnosis not present

## 2022-05-19 DIAGNOSIS — M25512 Pain in left shoulder: Secondary | ICD-10-CM | POA: Diagnosis not present

## 2022-05-21 DIAGNOSIS — M25512 Pain in left shoulder: Secondary | ICD-10-CM | POA: Diagnosis not present

## 2022-05-29 DIAGNOSIS — M25512 Pain in left shoulder: Secondary | ICD-10-CM | POA: Diagnosis not present

## 2022-06-03 ENCOUNTER — Ambulatory Visit (INDEPENDENT_AMBULATORY_CARE_PROVIDER_SITE_OTHER): Payer: Medicare Other | Admitting: Adult Health

## 2022-06-03 ENCOUNTER — Encounter: Payer: Self-pay | Admitting: Adult Health

## 2022-06-03 VITALS — BP 118/60 | HR 89 | Temp 98.2°F | Ht 67.0 in | Wt 175.0 lb

## 2022-06-03 DIAGNOSIS — F419 Anxiety disorder, unspecified: Secondary | ICD-10-CM

## 2022-06-03 DIAGNOSIS — F32A Depression, unspecified: Secondary | ICD-10-CM | POA: Diagnosis not present

## 2022-06-03 MED ORDER — CITALOPRAM HYDROBROMIDE 20 MG PO TABS: 20.0000 mg | ORAL_TABLET | Freq: Every day | ORAL | 1 refills | Status: DC

## 2022-06-03 NOTE — Progress Notes (Signed)
Subjective:    Patient ID: Paul Schwartz, male    DOB: Jan 26, 1941, 81 y.o.   MRN: [id number]  HPI 81 year old male who  has a past medical history of Arthritis, Bilateral shoulder pain, BPH (benign prostatic hyperplasia), GERD (gastroesophageal reflux disease), and History of kidney stones.  He presents with his wife today for depression.  He is currently prescribed Celexa 10 mg for anxiety.  He reports that since his shoulder surgery about 2 months ago he has been feeling "more down", he contributes this to not being able to stay as active as you was prior to surgery and is unable to do things that he once was able to do.  He also reports that he has had some close friends passed away from cancer and this has been weighing him down.  He does feel as though as his shoulder gets stronger and less painful and he is able to get back to doing what he did previously that his depression will improve.  Review of Systems See HPI   Past Medical History:  Diagnosis Date   Arthritis    Bilateral shoulder pain    BPH (benign prostatic hyperplasia)    GERD (gastroesophageal reflux disease)    History of kidney stones     Social History   Socioeconomic History   Marital status: Married    Spouse name: Not on file   Number of children: Not on file   Years of education: Not on file   Highest education level: Not on file  Occupational History   Not on file  Tobacco Use   Smoking status: Former    Packs/day: 1.00    Years: 10.00    Total pack years: 10.00    Types: Cigarettes    Quit date: 04/07/1968    Years since quitting: 54.1   Smokeless tobacco: Never  Vaping Use   Vaping Use: Never used  Substance and Sexual Activity   Alcohol use: Yes    Alcohol/week: 4.0 standard drinks of alcohol    Types: 4 Glasses of wine per week    Comment: weekends   Drug use: No   Sexual activity: Yes  Other Topics Concern   Not on file  Social History Narrative   Retired    Married - 6 years     One son that lives in [place]    2 grandchildren    Social Determinants of Health   Financial Resource Strain: [place]  (03/14/2022)   Overall Financial Resource Strain (CARDIA)    Difficulty of Paying Living Expenses: Not hard at all  Food Insecurity: No Food Insecurity (03/14/2022)   Hunger Vital Sign    Worried About Running Out of Food in the Last Year: Never true    Ran Out of Food in the Last Year: Never true  Transportation Needs: No Transportation Needs (03/14/2022)   PRAPARE - Hydrologist (Medical): No    Lack of Transportation (Non-Medical): No  Physical Activity: Insufficiently Active (03/14/2022)   Exercise Vital Sign    Days of Exercise per Week: 2 days    Minutes of Exercise per Session: 60 min  Stress: No Stress Concern Present (03/14/2022)   [place]    Feeling of Stress : Not at all  Social Connections: [place] (03/14/2022)   Social Connection and Isolation Panel [NHANES]    Frequency of Communication with Friends and Family: More than  three times a week    Frequency of Social Gatherings with Friends and Family: More than three times a week    Attends Religious Services: More than 4 times per year    Active Member of [employer] or Organizations: Yes    Attends Archivist Meetings: More than 4 times per year    Marital Status: Married  Human resources officer Violence: Not At Risk (03/14/2022)   Humiliation, Afraid, Rape, and Kick questionnaire    Fear of Current or Ex-Partner: No    Emotionally Abused: No    Physically Abused: No    Sexually Abused: No    Past Surgical History:  Procedure Laterality Date   achilles tendon Right    surgical repair bilat   CATARACT EXTRACTION, BILATERAL     COLONOSCOPY     REVERSE SHOULDER ARTHROPLASTY Right 08/19/2018   Procedure: REVERSE RIGHT SHOULDER ARTHROPLASTY;  Surgeon: Justice Britain, MD;  Location: [facility];  Service:  Orthopedics;  Laterality: Right;  153mn   REVERSE SHOULDER ARTHROPLASTY Left 04/10/2022   Procedure: REVERSE SHOULDER ARTHROPLASTY;  Surgeon: SJustice Britain MD;  Location: [facility];  Service: Orthopedics;  Laterality: Left;  1264m    Family History  Problem Relation Age of Onset   Heart disease Father    Colon cancer Neg Hx    Esophageal cancer Neg Hx    Stomach cancer Neg Hx    Rectal cancer Neg Hx     Allergies  Allergen Reactions   Plasticized Base [Plastibase] Hives and Rash    Current Outpatient Medications on File Prior to Visit  Medication Sig Dispense Refill   diclofenac Sodium (VOLTAREN) 1 % GEL Apply 1 application. topically 4 (four) times daily as needed (pain).     donepezil (ARICEPT) 10 MG tablet Take 1 tablet (10 mg total) by mouth at bedtime. 90 tablet 3   Omega-3 Fatty Acids (FISH OIL TRIPLE STRENGTH) 1400 MG CAPS Take 1,400 mg by mouth daily.     simvastatin (ZOCOR) 5 MG tablet Take 1 tablet (5 mg total) by mouth daily. 90 tablet 3   No current facility-administered medications on file prior to visit.    BP 118/60   Pulse 89   Temp 98.2 F (36.8 C) (Oral)   Ht '5\' 7"'$  (1.702 m)   Wt 175 lb (79.4 kg)   SpO2 98%   BMI 27.41 kg/m       Objective:   Physical Exam Vitals and nursing note reviewed.  Constitutional:      Appearance: Normal appearance.  Skin:    General: Skin is warm and dry.     Capillary Refill: Capillary refill takes less than 2 seconds.  Neurological:     General: No focal deficit present.     Mental Status: He is alert and oriented to person, place, and time.  Psychiatric:        Mood and Affect: Mood normal.        Behavior: Behavior normal.        Thought Content: Thought content normal.        Judgment: Judgment normal.       Assessment & Plan:  1. Anxiety and depression - Will increase his celexa from 10 mg to 20 mg to see if we can get his depression under better control. I am sure once he can start doing things outside  again that his depression will improve as well. Will have him follow up in one month  - citalopram (CELEXA) 20 MG  tablet; Take 1 tablet (20 mg total) by mouth daily.  Dispense: 90 tablet; Refill: 1  Dorothyann Peng, NP

## 2022-06-05 DIAGNOSIS — M25512 Pain in left shoulder: Secondary | ICD-10-CM | POA: Diagnosis not present

## 2022-06-12 DIAGNOSIS — M25512 Pain in left shoulder: Secondary | ICD-10-CM | POA: Diagnosis not present

## 2022-06-13 ENCOUNTER — Telehealth: Payer: Self-pay | Admitting: Adult Health

## 2022-06-13 NOTE — Telephone Encounter (Signed)
Pt having frequent diarrhea, thinks it may be a result of the meds he was prescribed at last visit. Seeking advice

## 2022-06-13 NOTE — Telephone Encounter (Signed)
Please advise 

## 2022-06-17 DIAGNOSIS — M25512 Pain in left shoulder: Secondary | ICD-10-CM | POA: Diagnosis not present

## 2022-06-17 NOTE — Telephone Encounter (Signed)
FYI Pt spouse stated that pt could've had the diarrhea from taking old medication. According to spouse pt is no longer having diarrhea since stopping the expired vitamins. Also, Pt has not started the 20 mg bc he still had 10 mg left.

## 2022-06-17 NOTE — Telephone Encounter (Signed)
Patient spouse notified of update  and verbalized understanding. 

## 2022-06-19 DIAGNOSIS — M25512 Pain in left shoulder: Secondary | ICD-10-CM | POA: Diagnosis not present

## 2022-06-25 DIAGNOSIS — H04123 Dry eye syndrome of bilateral lacrimal glands: Secondary | ICD-10-CM | POA: Diagnosis not present

## 2022-06-25 DIAGNOSIS — H40013 Open angle with borderline findings, low risk, bilateral: Secondary | ICD-10-CM | POA: Diagnosis not present

## 2022-06-25 DIAGNOSIS — M25512 Pain in left shoulder: Secondary | ICD-10-CM | POA: Diagnosis not present

## 2022-07-02 DIAGNOSIS — M25562 Pain in left knee: Secondary | ICD-10-CM | POA: Diagnosis not present

## 2022-07-02 DIAGNOSIS — Z96612 Presence of left artificial shoulder joint: Secondary | ICD-10-CM | POA: Diagnosis not present

## 2022-07-09 DIAGNOSIS — M25512 Pain in left shoulder: Secondary | ICD-10-CM | POA: Diagnosis not present

## 2022-07-10 ENCOUNTER — Ambulatory Visit: Payer: Medicare Other | Admitting: Adult Health

## 2022-07-11 ENCOUNTER — Encounter: Payer: Self-pay | Admitting: Adult Health

## 2022-07-11 ENCOUNTER — Ambulatory Visit (INDEPENDENT_AMBULATORY_CARE_PROVIDER_SITE_OTHER): Payer: Medicare Other | Admitting: Adult Health

## 2022-07-11 VITALS — BP 110/60 | HR 82 | Temp 98.9°F | Ht 67.0 in | Wt 178.0 lb

## 2022-07-11 DIAGNOSIS — G3184 Mild cognitive impairment, so stated: Secondary | ICD-10-CM | POA: Diagnosis not present

## 2022-07-11 MED ORDER — MEMANTINE HCL 28 X 5 MG & 21 X 10 MG PO TABS: ORAL_TABLET | ORAL | 12 refills | Status: DC

## 2022-07-11 NOTE — Patient Instructions (Signed)
I am going to add a medication called Namenda to your regimen   Please follow up in one month or sooner if needed

## 2022-07-11 NOTE — Progress Notes (Signed)
Subjective:    Patient ID: Paul Schwartz, male    DOB: [birth year], 81 y.o.   MRN: [id number]  HPI  81 year old male who  has a past medical history of Arthritis, Bilateral shoulder pain, BPH (benign prostatic hyperplasia), GERD (gastroesophageal reflux disease), and History of kidney stones.  He presents to the office today for mild cognitive impairment with memory loss. This diagnoses was made in September 2022 by Neuropsych at this time we started Aricept 5 mg and titrated up to 10 mg daily. There was some improvement in his symptoms but over the last few months he and his wife have noticed increased confusion and forgetting things around the house such as keys, wallet and phone. His wife reports " someday's he has good days and he is not confused and other days he seems more confused. Thankfully he is always happy".   Review of Systems See HPI   Past Medical History:  Diagnosis Date   Arthritis    Bilateral shoulder pain    BPH (benign prostatic hyperplasia)    GERD (gastroesophageal reflux disease)    History of kidney stones     Social History   Socioeconomic History   Marital status: Married    Spouse name: Not on file   Number of children: Not on file   Years of education: Not on file   Highest education level: Not on file  Occupational History   Not on file  Tobacco Use   Smoking status: Former    Packs/day: 1.00    Years: 10.00    Total pack years: 10.00    Types: Cigarettes    Quit date: 04/07/1968    Years since quitting: 54.2   Smokeless tobacco: Never  Vaping Use   Vaping Use: Never used  Substance and Sexual Activity   Alcohol use: Yes    Alcohol/week: 4.0 standard drinks of alcohol    Types: 4 Glasses of wine per week    Comment: weekends   Drug use: No   Sexual activity: Yes  Other Topics Concern   Not on file  Social History Narrative   Retired    Married - 80 years    One son that lives in [place]    2 grandchildren    Social  Determinants of Health   Financial Resource Strain: [place]  (03/14/2022)   Overall Financial Resource Strain (CARDIA)    Difficulty of Paying Living Expenses: Not hard at all  Food Insecurity: No Food Insecurity (03/14/2022)   Hunger Vital Sign    Worried About Running Out of Food in the Last Year: Never true    Ran Out of Food in the Last Year: Never true  Transportation Needs: No Transportation Needs (03/14/2022)   PRAPARE - Hydrologist (Medical): No    Lack of Transportation (Non-Medical): No  Physical Activity: Insufficiently Active (03/14/2022)   Exercise Vital Sign    Days of Exercise per Week: 2 days    Minutes of Exercise per Session: 60 min  Stress: No Stress Concern Present (03/14/2022)   [place]    Feeling of Stress : Not at all  Social Connections: [place] (03/14/2022)   Social Connection and Isolation Panel [NHANES]    Frequency of Communication with Friends and Family: More than three times a week    Frequency of Social Gatherings with Friends and Family: More than three times a week  Attends Religious Services: More than 4 times per year    Active Member of Clubs or Organizations: Yes    Attends Archivist Meetings: More than 4 times per year    Marital Status: Married  Human resources officer Violence: Not At Risk (03/14/2022)   Humiliation, Afraid, Rape, and Kick questionnaire    Fear of Current or Ex-Partner: No    Emotionally Abused: No    Physically Abused: No    Sexually Abused: No    Past Surgical History:  Procedure Laterality Date   achilles tendon Right    surgical repair bilat   CATARACT EXTRACTION, BILATERAL     COLONOSCOPY     REVERSE SHOULDER ARTHROPLASTY Right 08/19/2018   Procedure: REVERSE RIGHT SHOULDER ARTHROPLASTY;  Surgeon: Justice Britain, MD;  Location: [facility];  Service: Orthopedics;  Laterality: Right;  158mn   REVERSE SHOULDER  ARTHROPLASTY Left 04/10/2022   Procedure: REVERSE SHOULDER ARTHROPLASTY;  Surgeon: SJustice Britain MD;  Location: [facility];  Service: Orthopedics;  Laterality: Left;  124m    Family History  Problem Relation Age of Onset   Heart disease Father    Colon cancer Neg Hx    Esophageal cancer Neg Hx    Stomach cancer Neg Hx    Rectal cancer Neg Hx     Allergies  Allergen Reactions   Plasticized Base [Plastibase] Hives and Rash    Current Outpatient Medications on File Prior to Visit  Medication Sig Dispense Refill   citalopram (CELEXA) 20 MG tablet Take 1 tablet (20 mg total) by mouth daily. 90 tablet 1   diclofenac Sodium (VOLTAREN) 1 % GEL Apply 1 application. topically 4 (four) times daily as needed (pain).     donepezil (ARICEPT) 10 MG tablet Take 1 tablet (10 mg total) by mouth at bedtime. 90 tablet 3   Omega-3 Fatty Acids (FISH OIL TRIPLE STRENGTH) 1400 MG CAPS Take 1,400 mg by mouth daily.     simvastatin (ZOCOR) 5 MG tablet Take 1 tablet (5 mg total) by mouth daily. 90 tablet 3   No current facility-administered medications on file prior to visit.    BP 110/60   Pulse 82   Temp 98.9 F (37.2 C) (Oral)   Ht '5\' 7"'$  (1.702 m)   Wt 178 lb (80.7 kg)   SpO2 98%   BMI 27.88 kg/m       Objective:   Physical Exam Vitals and nursing note reviewed.  Constitutional:      Appearance: Normal appearance.  Cardiovascular:     Rate and Rhythm: Normal rate and regular rhythm.     Pulses: Normal pulses.     Heart sounds: Normal heart sounds.  Musculoskeletal:        General: Normal range of motion.  Skin:    General: Skin is warm and dry.     Capillary Refill: Capillary refill takes less than 2 seconds.  Neurological:     General: No focal deficit present.     Mental Status: He is alert and oriented to person, place, and time.  Psychiatric:        Mood and Affect: Mood normal.        Behavior: Behavior normal.        Thought Content: Thought content normal.        Cognition  and Memory: Cognition normal. Memory is impaired.        Judgment: Judgment normal.     Comments: Very mild confusion in the office today, he did  tell the same story twice         Assessment & Plan:  1. Mild cognitive impairment - Discussed adding Namenda to see if he could get any benefit from adding this to aricept. He would like to try.  - Follow up in one month or sooner if needed - memantine (NAMENDA TITRATION PAK) tablet pack; 5 mg/day for =1 week; 5 mg twice daily for =1 week; 15 mg/day given in 5 mg and 10 mg separated doses for =1 week; then 10 mg twice daily  Dispense: 49 tablet; Refill: [place], NP  Time spent with patient today was 32 minutes which consisted of chart review, discussing cognitive impairment and progress of disease process, work up, treatment answering questions and documentation.

## 2022-07-15 DIAGNOSIS — M25512 Pain in left shoulder: Secondary | ICD-10-CM | POA: Diagnosis not present

## 2022-07-17 DIAGNOSIS — G5602 Carpal tunnel syndrome, left upper limb: Secondary | ICD-10-CM | POA: Diagnosis not present

## 2022-07-17 DIAGNOSIS — G5622 Lesion of ulnar nerve, left upper limb: Secondary | ICD-10-CM | POA: Diagnosis not present

## 2022-07-22 DIAGNOSIS — M25512 Pain in left shoulder: Secondary | ICD-10-CM | POA: Diagnosis not present

## 2022-07-28 DIAGNOSIS — R200 Anesthesia of skin: Secondary | ICD-10-CM | POA: Diagnosis not present

## 2022-07-28 DIAGNOSIS — G5602 Carpal tunnel syndrome, left upper limb: Secondary | ICD-10-CM | POA: Diagnosis not present

## 2022-07-31 DIAGNOSIS — M25512 Pain in left shoulder: Secondary | ICD-10-CM | POA: Diagnosis not present

## 2022-08-25 ENCOUNTER — Telehealth: Payer: Self-pay | Admitting: Adult Health

## 2022-08-25 DIAGNOSIS — G3184 Mild cognitive impairment, so stated: Secondary | ICD-10-CM

## 2022-08-25 NOTE — Telephone Encounter (Signed)
Pt's spouse called to request a refill of the following, as Pt has only 2 pills left:  memantine (NAMENDA TITRATION PAK) tablet pack  LOV:  07/11/22  Pt is aware NP is off on Mondays.  [id number] [place], [place] - [address]:  [phone] Fax:  [phone]

## 2022-08-26 NOTE — Telephone Encounter (Signed)
Tried to call pt to advised that a year supply has been sent to the pharmacy in Sept.

## 2022-08-26 NOTE — Telephone Encounter (Signed)
Left message to return phone call.

## 2022-08-27 NOTE — Telephone Encounter (Signed)
Left message to return phone call.

## 2022-08-27 NOTE — Telephone Encounter (Signed)
Patient's spouse returned call.  

## 2022-08-27 NOTE — Telephone Encounter (Signed)
Called pt back no answer. Will try again later.

## 2022-08-28 NOTE — Telephone Encounter (Signed)
Pt notified of update and stated that Rx was filled.

## 2022-09-29 ENCOUNTER — Other Ambulatory Visit: Payer: Self-pay | Admitting: Adult Health

## 2022-09-29 DIAGNOSIS — G3184 Mild cognitive impairment, so stated: Secondary | ICD-10-CM

## 2022-09-30 ENCOUNTER — Other Ambulatory Visit: Payer: Self-pay | Admitting: Adult Health

## 2022-09-30 MED ORDER — MEMANTINE HCL 10 MG PO TABS: 10.0000 mg | ORAL_TABLET | Freq: Two times a day (BID) | ORAL | 3 refills | Status: DC

## 2022-11-20 ENCOUNTER — Ambulatory Visit: Payer: Medicare Other | Admitting: Adult Health

## 2022-11-21 ENCOUNTER — Ambulatory Visit (INDEPENDENT_AMBULATORY_CARE_PROVIDER_SITE_OTHER): Payer: Medicare Other | Admitting: Adult Health

## 2022-11-21 VITALS — BP 132/84 | HR 72 | Temp 98.4°F | Ht 67.0 in | Wt 190.0 lb

## 2022-11-21 DIAGNOSIS — J0141 Acute recurrent pansinusitis: Secondary | ICD-10-CM

## 2022-11-21 MED ORDER — PREDNISONE 10 MG PO TABS: ORAL_TABLET | ORAL | 0 refills | Status: DC

## 2022-11-21 MED ORDER — DOXYCYCLINE HYCLATE 100 MG PO CAPS: 100.0000 mg | ORAL_CAPSULE | Freq: Two times a day (BID) | ORAL | 0 refills | Status: DC

## 2022-11-21 NOTE — Progress Notes (Signed)
Subjective:    Patient ID: Paul Schwartz, male    DOB: Mar 23, 1941, 83 y.o.   MRN: [id number]  HPI  82 year old male who  has a past medical history of Arthritis, Bilateral shoulder pain, BPH (benign prostatic hyperplasia), GERD (gastroesophageal reflux disease), and History of kidney stones.  He presents to the office today for an acute issue. He reports that for the last 2-3 weeks he has had some chest congestion, sinus pain and pressure, non productive cough, and rhinorrhea.   He denies fevers, chills, or shortness of breath.  Review of Systems See HPI   Past Medical History:  Diagnosis Date   Arthritis    Bilateral shoulder pain    BPH (benign prostatic hyperplasia)    GERD (gastroesophageal reflux disease)    History of kidney stones     Social History   Socioeconomic History   Marital status: Married    Spouse name: Not on file   Number of children: Not on file   Years of education: Not on file   Highest education level: Not on file  Occupational History   Not on file  Tobacco Use   Smoking status: Former    Packs/day: 1.00    Years: 10.00    Total pack years: 10.00    Types: Cigarettes    Quit date: 04/07/1968    Years since quitting: 54.6   Smokeless tobacco: Never  Vaping Use   Vaping Use: Never used  Substance and Sexual Activity   Alcohol use: Yes    Alcohol/week: 4.0 standard drinks of alcohol    Types: 4 Glasses of wine per week    Comment: weekends   Drug use: No   Sexual activity: Yes  Other Topics Concern   Not on file  Social History Narrative   Retired    Married - 53 years    One son that lives in [place]    2 grandchildren    Social Determinants of Health   Financial Resource Strain: [place]  (03/14/2022)   Overall Financial Resource Strain (CARDIA)    Difficulty of Paying Living Expenses: Not hard at all  Food Insecurity: No Food Insecurity (03/14/2022)   Hunger Vital Sign    Worried About Running Out of Food in the Last Year:  Never true    Ran Out of Food in the Last Year: Never true  Transportation Needs: No Transportation Needs (03/14/2022)   PRAPARE - Hydrologist (Medical): No    Lack of Transportation (Non-Medical): No  Physical Activity: Insufficiently Active (03/14/2022)   Exercise Vital Sign    Days of Exercise per Week: 2 days    Minutes of Exercise per Session: 60 min  Stress: No Stress Concern Present (03/14/2022)   [place]    Feeling of Stress : Not at all  Social Connections: [place] (03/14/2022)   Social Connection and Isolation Panel [NHANES]    Frequency of Communication with Friends and Family: More than three times a week    Frequency of Social Gatherings with Friends and Family: More than three times a week    Attends Religious Services: More than 4 times per year    Active Member of [employer] or Organizations: Yes    Attends Archivist Meetings: More than 4 times per year    Marital Status: Married  Human resources officer Violence: Not At Risk (03/14/2022)   Humiliation, Afraid, Rape,  and Kick questionnaire    Fear of Current or Ex-Partner: No    Emotionally Abused: No    Physically Abused: No    Sexually Abused: No    Past Surgical History:  Procedure Laterality Date   achilles tendon Right    surgical repair bilat   CATARACT EXTRACTION, BILATERAL     COLONOSCOPY     REVERSE SHOULDER ARTHROPLASTY Right 08/19/2018   Procedure: REVERSE RIGHT SHOULDER ARTHROPLASTY;  Surgeon: Justice Britain, MD;  Location: [facility];  Service: Orthopedics;  Laterality: Right;  172mn   REVERSE SHOULDER ARTHROPLASTY Left 04/10/2022   Procedure: REVERSE SHOULDER ARTHROPLASTY;  Surgeon: SJustice Britain MD;  Location: [facility];  Service: Orthopedics;  Laterality: Left;  126m    Family History  Problem Relation Age of Onset   Heart disease Father    Colon cancer Neg Hx    Esophageal cancer Neg Hx    Stomach  cancer Neg Hx    Rectal cancer Neg Hx     Allergies  Allergen Reactions   Plasticized Base [Plastibase] Hives and Rash    Current Outpatient Medications on File Prior to Visit  Medication Sig Dispense Refill   citalopram (CELEXA) 20 MG tablet Take 1 tablet (20 mg total) by mouth daily. 90 tablet 1   diclofenac Sodium (VOLTAREN) 1 % GEL Apply 1 application. topically 4 (four) times daily as needed (pain).     donepezil (ARICEPT) 10 MG tablet Take 1 tablet (10 mg total) by mouth at bedtime. 90 tablet 3   memantine (NAMENDA) 10 MG tablet Take 1 tablet (10 mg total) by mouth 2 (two) times daily. 180 tablet 3   Omega-3 Fatty Acids (FISH OIL TRIPLE STRENGTH) 1400 MG CAPS Take 1,400 mg by mouth daily.     simvastatin (ZOCOR) 5 MG tablet Take 1 tablet (5 mg total) by mouth daily. 90 tablet 3   No current facility-administered medications on file prior to visit.    BP (!) 140/100   Pulse 72   Temp 98.4 F (36.9 C) (Oral)   Ht '5\' 7"'$  (1.702 m)   Wt 190 lb (86.2 kg)   SpO2 98%   BMI 29.76 kg/m       Objective:   Physical Exam Vitals and nursing note reviewed.  Constitutional:      Appearance: Normal appearance.  HENT:     Nose: Congestion and rhinorrhea present. Rhinorrhea is purulent.     Right Turbinates: Enlarged and swollen.     Left Turbinates: Enlarged and swollen.     Right Sinus: Maxillary sinus tenderness and frontal sinus tenderness present.     Left Sinus: Maxillary sinus tenderness and frontal sinus tenderness present.     Mouth/Throat:     Mouth: Mucous membranes are moist.     Pharynx: Oropharynx is clear.  Cardiovascular:     Rate and Rhythm: Normal rate and regular rhythm.     Pulses: Normal pulses.     Heart sounds: Normal heart sounds.  Pulmonary:     Effort: Pulmonary effort is normal.     Breath sounds: Normal breath sounds.  Musculoskeletal:        General: Normal range of motion.  Skin:    General: Skin is warm and dry.     Capillary Refill:  Capillary refill takes less than 2 seconds.  Neurological:     General: No focal deficit present.     Mental Status: He is alert and oriented to person, place, and time.  Psychiatric:  Mood and Affect: Mood normal.        Behavior: Behavior normal.        Thought Content: Thought content normal.       Assessment & Plan:  1. Acute recurrent pansinusitis - Will treat due to symptoms and duration  - doxycycline (VIBRAMYCIN) 100 MG capsule; Take 1 capsule (100 mg total) by mouth 2 (two) times daily.  Dispense: 14 capsule; Refill: 0 - predniSONE (DELTASONE) 10 MG tablet; 40 mg x 3 days, 20 mg x 3 days, 10 mg x 3 days  Dispense: 21 tablet; Refill: 0  Dorothyann Peng, NP

## 2022-11-21 NOTE — Patient Instructions (Signed)
I think you have a sinus infection  I have sent in an antibiotic called Doxycycline and some steroids

## 2022-12-01 ENCOUNTER — Telehealth: Payer: Self-pay | Admitting: Adult Health

## 2022-12-01 NOTE — Telephone Encounter (Signed)
Spouse thinks her husband is depressed. He doesn't want to go out and do anything, just stays in his recliner. No energy. Wonders if he needs to get some meds to assist.

## 2022-12-02 ENCOUNTER — Other Ambulatory Visit: Payer: Self-pay | Admitting: Adult Health

## 2022-12-02 DIAGNOSIS — F32A Depression, unspecified: Secondary | ICD-10-CM

## 2022-12-02 NOTE — Telephone Encounter (Signed)
Called both numbers no answer

## 2022-12-02 NOTE — Telephone Encounter (Signed)
Spoke to pt and spouse and pt stated that he feels fine. He feels a lot better after starting the medication to help with his "sickness'> no further actions needed!

## 2022-12-02 NOTE — Telephone Encounter (Signed)
Pt wife called, returning CMA's call. CMA was unavailable. Pt asked that CMA call back at her earliest convenience.

## 2022-12-02 NOTE — Telephone Encounter (Signed)
Left message to return phone call.

## 2022-12-20 ENCOUNTER — Other Ambulatory Visit: Payer: Self-pay | Admitting: Adult Health

## 2022-12-20 DIAGNOSIS — G3184 Mild cognitive impairment, so stated: Secondary | ICD-10-CM

## 2022-12-23 NOTE — Telephone Encounter (Signed)
  The original prescription was discontinued on 01/14/2022 by Dorothyann Peng, NP. Renewing this prescription may not be appropriate.

## 2023-01-06 DIAGNOSIS — H04123 Dry eye syndrome of bilateral lacrimal glands: Secondary | ICD-10-CM | POA: Diagnosis not present

## 2023-01-06 DIAGNOSIS — H40013 Open angle with borderline findings, low risk, bilateral: Secondary | ICD-10-CM | POA: Diagnosis not present

## 2023-01-06 DIAGNOSIS — H353131 Nonexudative age-related macular degeneration, bilateral, early dry stage: Secondary | ICD-10-CM | POA: Diagnosis not present

## 2023-01-06 DIAGNOSIS — H35013 Changes in retinal vascular appearance, bilateral: Secondary | ICD-10-CM | POA: Diagnosis not present

## 2023-01-08 DIAGNOSIS — R310 Gross hematuria: Secondary | ICD-10-CM | POA: Diagnosis not present

## 2023-01-27 DIAGNOSIS — R319 Hematuria, unspecified: Secondary | ICD-10-CM | POA: Diagnosis not present

## 2023-01-27 DIAGNOSIS — N210 Calculus in bladder: Secondary | ICD-10-CM | POA: Diagnosis not present

## 2023-01-27 DIAGNOSIS — K760 Fatty (change of) liver, not elsewhere classified: Secondary | ICD-10-CM | POA: Diagnosis not present

## 2023-01-27 DIAGNOSIS — R310 Gross hematuria: Secondary | ICD-10-CM | POA: Diagnosis not present

## 2023-01-29 DIAGNOSIS — R3914 Feeling of incomplete bladder emptying: Secondary | ICD-10-CM | POA: Diagnosis not present

## 2023-01-29 DIAGNOSIS — R310 Gross hematuria: Secondary | ICD-10-CM | POA: Diagnosis not present

## 2023-01-29 DIAGNOSIS — N210 Calculus in bladder: Secondary | ICD-10-CM | POA: Diagnosis not present

## 2023-02-17 DIAGNOSIS — R310 Gross hematuria: Secondary | ICD-10-CM | POA: Diagnosis not present

## 2023-02-17 DIAGNOSIS — R3914 Feeling of incomplete bladder emptying: Secondary | ICD-10-CM | POA: Diagnosis not present

## 2023-02-17 DIAGNOSIS — N3000 Acute cystitis without hematuria: Secondary | ICD-10-CM | POA: Diagnosis not present

## 2023-02-18 ENCOUNTER — Ambulatory Visit: Payer: Medicare Other | Admitting: Adult Health

## 2023-02-18 NOTE — Progress Notes (Deleted)
Subjective:    Patient ID: Paul Schwartz, male    DOB: 13-Mar-1941, 82 y.o.   MRN: [id number]  HPI 82 year old male who  has a past medical history of Arthritis, Bilateral shoulder pain, BPH (benign prostatic hyperplasia), GERD (gastroesophageal reflux disease), and History of kidney stones.  He presents to the office today for cognitive impairment with memory loss.  He was last seen for this issue back in September 2023.  His diagnosis was made in September 2022 by neuropsychiatry at this time Aricept was started at 5 mg and then titrated up to 10 mg daily.  Initially there was some improvement in his symptoms but when he was seen in September 2023 his wife had noticed increased confusion and forgetting things around the house such as his keys, wallet, and phone.  At this time we started Namenda titration pack and then kept him on Namenda 10 mg twice daily.     Review of Systems See HPI   Past Medical History:  Diagnosis Date   Arthritis    Bilateral shoulder pain    BPH (benign prostatic hyperplasia)    GERD (gastroesophageal reflux disease)    History of kidney stones     Social History   Socioeconomic History   Marital status: Married    Spouse name: Not on file   Number of children: Not on file   Years of education: Not on file   Highest education level: Not on file  Occupational History   Not on file  Tobacco Use   Smoking status: Former    Packs/day: 1.00    Years: 10.00    Additional pack years: 0.00    Total pack years: 10.00    Types: Cigarettes    Quit date: 04/07/1968    Years since quitting: 54.9   Smokeless tobacco: Never  Vaping Use   Vaping Use: Never used  Substance and Sexual Activity   Alcohol use: Yes    Alcohol/week: 4.0 standard drinks of alcohol    Types: 4 Glasses of wine per week    Comment: weekends   Drug use: No   Sexual activity: Yes  Other Topics Concern   Not on file  Social History Narrative   Retired    Married - 50 years     One son that lives in [place]    2 grandchildren    Social Determinants of Health   Financial Resource Strain: Low Risk  (03/14/2022)   Overall Financial Resource Strain (CARDIA)    Difficulty of Paying Living Expenses: Not hard at all  Food Insecurity: No Food Insecurity (03/14/2022)   Hunger Vital Sign    Worried About Running Out of Food in the Last Year: Never true    Ran Out of Food in the Last Year: Never true  Transportation Needs: No Transportation Needs (03/14/2022)   PRAPARE - Administrator, Civil ServiceTransportation    Lack of Transportation (Medical): No    Lack of Transportation (Non-Medical): No  Physical Activity: Insufficiently Active (03/14/2022)   Exercise Vital Sign    Days of Exercise per Week: 2 days    Minutes of Exercise per Session: 60 min  Stress: No Stress Concern Present (03/14/2022)   [employer] - Occupational Stress Questionnaire    Feeling of Stress : Not at all  Social Connections: [place] (03/14/2022)   Social Connection and Isolation Panel [NHANES]    Frequency of Communication with Friends and Family: More than three times a week  Frequency of Social Gatherings with Friends and Family: More than three times a week    Attends Religious Services: More than 4 times per year    Active Member of [employer] or Organizations: Yes    Attends Banker Meetings: More than 4 times per year    Marital Status: Married  Catering manager Violence: Not At Risk (03/14/2022)   Humiliation, Afraid, Rape, and Kick questionnaire    Fear of Current or Ex-Partner: No    Emotionally Abused: No    Physically Abused: No    Sexually Abused: No    Past Surgical History:  Procedure Laterality Date   achilles tendon Right    surgical repair bilat   CATARACT EXTRACTION, BILATERAL     COLONOSCOPY     REVERSE SHOULDER ARTHROPLASTY Right 08/19/2018   Procedure: REVERSE RIGHT SHOULDER ARTHROPLASTY;  Surgeon: Francena Hanly, MD;  Location: [facility];  Service:  Orthopedics;  Laterality: Right;    REVERSE SHOULDER ARTHROPLASTY Left 04/10/2022   Procedure: REVERSE SHOULDER ARTHROPLASTY;  Surgeon: Francena Hanly, MD;  Location: [facility];  Service: Orthopedics;  Laterality: Left;     Family History  Problem Relation Age of Onset   Heart disease Father    Colon cancer Neg Hx    Esophageal cancer Neg Hx    Stomach cancer Neg Hx    Rectal cancer Neg Hx     Allergies  Allergen Reactions   Plasticized Base [Plastibase] Hives and Rash    Current Outpatient Medications on File Prior to Visit  Medication Sig Dispense Refill   citalopram (CELEXA) 20 MG tablet TAKE 1 TABLET BY MOUTH EVERY DAY 90 tablet 1   diclofenac Sodium (VOLTAREN) 1 % GEL Apply 1 application. topically 4 (four) times daily as needed (pain).     donepezil (ARICEPT) 10 MG tablet Take 1 tablet (10 mg total) by mouth at bedtime. 90 tablet 3   doxycycline (VIBRAMYCIN) 100 MG capsule Take 1 capsule (100 mg total) by mouth 2 (two) times daily. 14 capsule 0   memantine (NAMENDA) 10 MG tablet Take 1 tablet (10 mg total) by mouth 2 (two) times daily. 180 tablet 3   Omega-3 Fatty Acids (FISH OIL TRIPLE STRENGTH) 1400 MG CAPS Take 1,400 mg by mouth daily.     predniSONE (DELTASONE) 10 MG tablet 40 mg x 3 days, 20 mg x 3 days, 10 mg x 3 days 21 tablet 0   simvastatin (ZOCOR) 5 MG tablet Take 1 tablet (5 mg total) by mouth daily. 90 tablet 3   No current facility-administered medications on file prior to visit.    There were no vitals taken for this visit.      Objective:   Physical Exam Vitals and nursing note reviewed.  Constitutional:      Appearance: Normal appearance.  Cardiovascular:     Rate and Rhythm: Normal rate and regular rhythm.     Heart sounds: Normal heart sounds.  Pulmonary:     Effort: Pulmonary effort is normal.     Breath sounds: Normal breath sounds.  Musculoskeletal:        General: Normal range of motion.  Skin:    General: Skin is warm.   Neurological:     General: No focal deficit present.     Mental Status: He is alert and oriented to person, place, and time.  Psychiatric:        Mood and Affect: Mood normal.        Behavior: Behavior  normal.        Thought Content: Thought content normal.        Judgment: Judgment normal.       Assessment & Plan:

## 2023-02-23 DIAGNOSIS — N3000 Acute cystitis without hematuria: Secondary | ICD-10-CM | POA: Diagnosis not present

## 2023-02-23 DIAGNOSIS — R3914 Feeling of incomplete bladder emptying: Secondary | ICD-10-CM | POA: Diagnosis not present

## 2023-03-03 DIAGNOSIS — R3914 Feeling of incomplete bladder emptying: Secondary | ICD-10-CM | POA: Diagnosis not present

## 2023-03-03 DIAGNOSIS — N210 Calculus in bladder: Secondary | ICD-10-CM | POA: Diagnosis not present

## 2023-03-05 ENCOUNTER — Encounter: Payer: Self-pay | Admitting: Adult Health

## 2023-03-05 ENCOUNTER — Ambulatory Visit (INDEPENDENT_AMBULATORY_CARE_PROVIDER_SITE_OTHER): Payer: Medicare Other | Admitting: Adult Health

## 2023-03-05 VITALS — BP 140/80 | HR 76 | Temp 98.1°F | Ht 67.0 in | Wt 195.0 lb

## 2023-03-05 DIAGNOSIS — M542 Cervicalgia: Secondary | ICD-10-CM

## 2023-03-05 DIAGNOSIS — N210 Calculus in bladder: Secondary | ICD-10-CM | POA: Diagnosis not present

## 2023-03-05 DIAGNOSIS — R351 Nocturia: Secondary | ICD-10-CM

## 2023-03-05 DIAGNOSIS — N401 Enlarged prostate with lower urinary tract symptoms: Secondary | ICD-10-CM

## 2023-03-05 NOTE — Progress Notes (Signed)
Subjective:    Patient ID: Paul Schwartz, male    DOB: Mar 19, 1941, 82 y.o.   MRN: [id number]  Fall    82 year old male who  has a past medical history of Arthritis, Bilateral shoulder pain, BPH (benign prostatic hyperplasia), GERD (gastroesophageal reflux disease), and History of kidney stones.  He presents to the office today for an acute issue of neck pain. He reports that he was on a low step stool hanging something to scare the birds away from the feeder. He missed a step and fell backwards hitting the back of his head on a the lawn. This happened a week ago. He has mild soreness in his neck but no pain. He has been using a heating pad and this helped. He just wants to make sure he is ok. He denies blurred vision, headache, loss of ROM.   Additionally, he would like to have a second opinion on his prostate issues. He is seen at [facility] by Dr. Alvester Morin who most recently advised on having TURP and cystolitholapaxy for bladder stones and evidence of bladder outlet syndrome. He would like to have a second opinion d/t being nervous about surgery and the side effects that come after prostate surgery    Review of Systems See HPI   Past Medical History:  Diagnosis Date   Arthritis    Bilateral shoulder pain    BPH (benign prostatic hyperplasia)    GERD (gastroesophageal reflux disease)    History of kidney stones     Social History   Socioeconomic History   Marital status: Married    Spouse name: Not on file   Number of children: Not on file   Years of education: Not on file   Highest education level: Not on file  Occupational History   Not on file  Tobacco Use   Smoking status: Former    Packs/day: 1.00    Years: 10.00    Additional pack years: 0.00    Total pack years: 10.00    Types: Cigarettes    Quit date: 04/07/1968    Years since quitting: 54.9   Smokeless tobacco: Never  Vaping Use   Vaping Use: Never used  Substance and Sexual Activity   Alcohol use:  Yes    Alcohol/week: 4.0 standard drinks of alcohol    Types: 4 Glasses of wine per week    Comment: weekends   Drug use: No   Sexual activity: Yes  Other Topics Concern   Not on file  Social History Narrative   Retired    Married - 50 years    One son that lives in [place]    2 grandchildren    Social Determinants of Health   Financial Resource Strain: Low Risk  (03/14/2022)   Overall Financial Resource Strain (CARDIA)    Difficulty of Paying Living Expenses: Not hard at all  Food Insecurity: No Food Insecurity (03/14/2022)   Hunger Vital Sign    Worried About Running Out of Food in the Last Year: Never true    Ran Out of Food in the Last Year: Never true  Transportation Needs: No Transportation Needs (03/14/2022)   PRAPARE - Administrator, Civil Service (Medical): No    Lack of Transportation (Non-Medical): No  Physical Activity: Insufficiently Active (03/14/2022)   Exercise Vital Sign    Days of Exercise per Week: 2 days    Minutes of Exercise per Session: 60 min  Stress: No Stress Concern Present (  03/14/2022)   [employer] of Occupational Health - Occupational Stress Questionnaire    Feeling of Stress : Not at all  Social Connections: Socially Integrated (03/14/2022)   Social Connection and Isolation Panel [NHANES]    Frequency of Communication with Friends and Family: More than three times a week    Frequency of Social Gatherings with Friends and Family: More than three times a week    Attends Religious Services: More than 4 times per year    Active Member of [employer] or Organizations: Yes    Attends Engineer, structural: More than 4 times per year    Marital Status: Married  Catering manager Violence: Not At Risk (03/14/2022)   Humiliation, Afraid, Rape, and Kick questionnaire    Fear of Current or Ex-Partner: No    Emotionally Abused: No    Physically Abused: No    Sexually Abused: No    Past Surgical History:  Procedure Laterality Date   achilles  tendon Right    surgical repair bilat   CATARACT EXTRACTION, BILATERAL     COLONOSCOPY     REVERSE SHOULDER ARTHROPLASTY Right 08/19/2018   Procedure: REVERSE RIGHT SHOULDER ARTHROPLASTY;  Surgeon: Francena Hanly, MD;  Location: [facility] OR;  Service: Orthopedics;  Laterality: Right;    REVERSE SHOULDER ARTHROPLASTY Left 04/10/2022   Procedure: REVERSE SHOULDER ARTHROPLASTY;  Surgeon: Francena Hanly, MD;  Location: [facility];  Service: Orthopedics;  Laterality: Left;     Family History  Problem Relation Age of Onset   Heart disease Father    Colon cancer Neg Hx    Esophageal cancer Neg Hx    Stomach cancer Neg Hx    Rectal cancer Neg Hx     Allergies  Allergen Reactions   Plasticized Base [Plastibase] Hives and Rash    Current Outpatient Medications on File Prior to Visit  Medication Sig Dispense Refill   citalopram (CELEXA) 20 MG tablet TAKE 1 TABLET BY MOUTH EVERY DAY 90 tablet 1   diclofenac Sodium (VOLTAREN) 1 % GEL Apply 1 application. topically 4 (four) times daily as needed (pain).     donepezil (ARICEPT) 10 MG tablet Take 1 tablet (10 mg total) by mouth at bedtime. 90 tablet 3   doxycycline (VIBRAMYCIN) 100 MG capsule Take 1 capsule (100 mg total) by mouth 2 (two) times daily. 14 capsule 0   Omega-3 Fatty Acids (FISH OIL TRIPLE STRENGTH) 1400 MG CAPS Take 1,400 mg by mouth daily.     predniSONE (DELTASONE) 10 MG tablet 40 mg x 3 days, 20 mg x 3 days, 10 mg x 3 days 21 tablet 0   simvastatin (ZOCOR) 5 MG tablet Take 1 tablet (5 mg total) by mouth daily. 90 tablet 3   memantine (NAMENDA) 10 MG tablet Take 1 tablet (10 mg total) by mouth 2 (two) times daily. 180 tablet 3   No current facility-administered medications on file prior to visit.    BP (!) 140/80   Pulse 76   Temp 98.1 F (36.7 C) (Oral)   Ht  (1.702 m)   Wt 195 lb (88.5 kg)   SpO2 98%   BMI 30.54 kg/m       Objective:   Physical Exam Vitals and nursing note reviewed.  Constitutional:       Appearance: Normal appearance.  HENT:     Right Ear: No hemotympanum.     Left Ear: No hemotympanum.  Cardiovascular:     Rate and Rhythm: Normal rate and  regular rhythm.     Pulses: Normal pulses.     Heart sounds: Normal heart sounds.  Pulmonary:     Effort: Pulmonary effort is normal.     Breath sounds: Normal breath sounds.  Musculoskeletal:        General: Normal range of motion.     Cervical back: Full passive range of motion without pain and normal range of motion. No edema, signs of trauma, torticollis or crepitus. No pain with movement, spinous process tenderness or muscular tenderness. Normal range of motion.  Skin:    General: Skin is warm and dry.  Neurological:     General: No focal deficit present.     Mental Status: He is alert and oriented to person, place, and time.  Psychiatric:        Mood and Affect: Mood normal.        Behavior: Behavior normal.        Thought Content: Thought content normal.        Judgment: Judgment normal.        Assessment & Plan:  1. Neck pain - No signs of trauma  - no pain with palpation. Can take tylenol and use a heating pad. - Encouraged not to climb ladders    2. Benign prostatic hyperplasia with nocturia  - Ambulatory referral to Urology  3. Bladder stones  - Ambulatory referral to Urology  Shirline Frees, NP  Time spent with patient today was 32 minutes which consisted of chart review, discussing neck pain, falls, BPH, and bladder stones,  work up, treatment answering questions and documentation.

## 2023-03-10 ENCOUNTER — Telehealth: Payer: Self-pay | Admitting: Adult Health

## 2023-03-10 NOTE — Telephone Encounter (Signed)
Contacted Paul Schwartz to schedule their annual wellness visit. Appointment made for 03/17/23.  Rudell Cobb AWV direct phone # [phone]  Due to schedule moved 5/7 AWV to [employer] schedule.  Also left message letting pt know appt time change

## 2023-03-11 ENCOUNTER — Other Ambulatory Visit: Payer: Self-pay | Admitting: Adult Health

## 2023-03-12 ENCOUNTER — Other Ambulatory Visit: Payer: Self-pay | Admitting: Adult Health

## 2023-03-12 DIAGNOSIS — G3184 Mild cognitive impairment, so stated: Secondary | ICD-10-CM

## 2023-03-12 DIAGNOSIS — Z0000 Encounter for general adult medical examination without abnormal findings: Secondary | ICD-10-CM

## 2023-03-17 ENCOUNTER — Ambulatory Visit (INDEPENDENT_AMBULATORY_CARE_PROVIDER_SITE_OTHER): Payer: Medicare Other | Admitting: Family Medicine

## 2023-03-17 DIAGNOSIS — Z0000 Encounter for general adult medical examination without abnormal findings: Secondary | ICD-10-CM

## 2023-03-17 NOTE — Patient Instructions (Signed)
I really enjoyed getting to talk with you today! I am available on Tuesdays and Thursdays for virtual visits if you have any questions or concerns, or if I can be of any further assistance.   CHECKLIST FROM ANNUAL WELLNESS VISIT:  -Follow up (please call to schedule if not scheduled after visit):   -yearly for annual wellness visit with primary care office  Here is a list of your preventive care/health maintenance measures and the plan for each if any are due:  PLAN For any measures below that may be due:  -can get vaccines due at the pharmacy  Health Maintenance  Topic Date Due   Zoster Vaccines- Shingrix (2 of 2) 12/12/2020   DTaP/Tdap/Td (3 - Td or Tdap) 09/03/2022   INFLUENZA VACCINE  06/11/2023   Medicare Annual Wellness (AWV)  03/16/2024   Pneumonia Vaccine 72+ Years old  Completed   COVID-19 Vaccine  Completed   HPV VACCINES  Aged Out    -See a dentist at least yearly  -Get your eyes checked and then per your eye specialist's recommendations  -Other issues addressed today:   -I have included below further information regarding a healthy whole foods based diet, physical activity guidelines for adults, stress management and opportunities for social connections. I hope you find this information useful.   -----------------------------------------------------------------------------------------------------------------------------------------------------------------------------------------------------------------------------------------------------------  NUTRITION: -eat real food: lots of colorful vegetables (half the plate) and fruits -5-7 servings of vegetables and fruits per day (fresh or steamed is best), exp. 2 servings of vegetables with lunch and dinner and 2 servings of fruit per day. Berries and greens such as kale and collards are great choices.  -consume on a regular basis: whole grains (make sure first ingredient on label contains the word "whole"), fresh fruits,  fish, nuts, seeds, healthy oils (such as olive oil, avocado oil, grape seed oil) -may eat small amounts of dairy and lean meat on occasion, but avoid processed meats such as ham, bacon, lunch meat, etc. -drink water -try to avoid fast food and pre-packaged foods, processed meat -most experts advise limiting sodium to < 2300mg  per day, should limit further is any chronic conditions such as high blood pressure, heart disease, diabetes, etc. The American Heart Association advised that < 1500mg  is is ideal -try to avoid foods that contain any ingredients with names you do not recognize  -try to avoid sugar/sweets (except for the natural sugar that occurs in fresh fruit) -try to avoid sweet drinks -try to avoid white rice, white bread, pasta (unless whole grain), white or yellow potatoes  EXERCISE GUIDELINES FOR ADULTS: -if you wish to increase your physical activity, do so gradually and with the approval of your doctor -STOP and seek medical care immediately if you have any chest pain, chest discomfort or trouble breathing when starting or increasing exercise  -move and stretch your body, legs, feet and arms when sitting for long periods -Physical activity guidelines for optimal health in adults: -least 150 minutes per week of aerobic exercise (can talk, but not sing) once approved by your doctor, 20-30 minutes of sustained activity or two 10 minute episodes of sustained activity every day.  -resistance training at least 2 days per week if approved by your doctor -balance exercises 3+ days per week:   Stand somewhere where you have something sturdy to hold onto if you lose balance.    1) lift up on toes, start with 5x per day and work up to 20x   2) stand and lift on leg straight  out to the side so that foot is a few inches of the floor, start with 5x each side and work up to 20x each side   3) stand on one foot, start with 5 seconds each side and work up to 20 seconds on each side  If you need  ideas or help with getting more active:  -Silver sneakers https://tools.silversneakers.com  -Walk with a Doc: http://www.duncan-williams.com/  -try to include resistance (weight lifting/strength building) and balance exercises twice per week: or the following link for ideas: http://castillo-powell.com/  BuyDucts.dk  STRESS MANAGEMENT: -can try meditating, or just sitting quietly with deep breathing while intentionally relaxing all parts of your body for 5 minutes daily -if you need further help with stress, anxiety or depression please follow up with your primary doctor or contact the wonderful folks at [facility]: [phone]  SOCIAL CONNECTIONS: -options in [place] if you wish to engage in more social and exercise related activities:  -Silver sneakers https://tools.silversneakers.com  -Walk with a Doc: http://www.duncan-williams.com/  -Check out the [facility] Active Adults 50+ section on the [place] [employer] (hiking clubs, book clubs, cards and games, chess, exercise classes, aquatic classes and much more) - see the website for details: https://www.[place].gov/departments/parks-recreation/active-adults50  -YouTube has lots of exercise videos for different ages and abilities as well  -[facility] (a variety of indoor and outdoor inperson activities for adults). 7745222983. [address]  -Virtual Online Classes (a variety of topics): see seniorplanet.org or call (343)719-8268  -consider volunteering at a school, hospice center, church, senior center or elsewhere

## 2023-03-17 NOTE — Progress Notes (Signed)
PATIENT CHECK-IN and HEALTH RISK ASSESSMENT QUESTIONNAIRE:  -completed by phone/video for upcoming Medicare Preventive Visit  Pre-Visit Check-in: 1)Vitals (height, wt, BP, etc) - record in vitals section for visit on day of visit 2)Review and Update Medications, Allergies PMH, Surgeries, Social history in Epic 3)Hospitalizations in the last year with date/reason?  No   4)Review and Update Care Team (patient's specialists) in Epic 5) Complete PHQ9 in Epic  6) Complete Fall Screening in Epic 7)Review all Health Maintenance Due and order under PCP if not done.  Medicare Wellness Patient Questionnaire:  Answer theses question about your habits: Do you drink alcohol?  Occasionally  If yes, how many drinks do you have a day? 2 times weekly  Have you ever smoked? No  Quit date if applicable?  20-30 years ago   How many packs a day do/did you smoke? 4 cigarettes daily  Do you use smokeless tobacco? No  Do you use an illicit drugs? No  Do you exercises? Walks a little daily  Are you sexually active? Yes Number of partners? 1  Typical breakfast  milk and coffee, bagel or cereal Typical lunch salad or fruit  Typical dinner vegetable, meat, potatoes (mostly chicken), stew, rice beans depends  Typical snacks: mixed nuts  Beverages: gatorade no sugar mixed with water   Answer theses question about you: Can you perform most household chores? yes Do you find it hard to follow a conversation in a noisy room? No  Do you often ask people to speak up or repeat themselves? Sometimes due to language Do you feel that you have a problem with memory? yes Do you balance your checkbook and or bank acounts? No, wife does  Do you feel safe at home? Yes  Last dentist visit? every year  Do you need assistance with any of the following: Please note if so  no   Driving?  Feeding yourself?  Getting from bed to chair?  Getting to the toilet?  Bathing or showering?  Dressing yourself?  Managing  money?  Climbing a flight of stairs  Preparing meals?    Do you have Advanced Directives in place (Living Will, Healthcare Power or Attorney)?  Yes    Last eye Exam and location? Dr Elmer Picker    Do you currently use prescribed or non-prescribed narcotic or opioid pain medications? No   Do you have a history or close family history of breast, ovarian, tubal or peritoneal cancer or a family member with BRCA (breast cancer susceptibility 1 and 2) gene mutations? No   Nurse/Assistant Credentials/time stamp: Tora Perches -CMA   ----------------------------------------------------------------------------------------------------------------------------------------------------------------------------------------------------------------------    MEDICARE ANNUAL PREVENTIVE CARE VISIT WITH PROVIDER (Welcome to Medicare, initial annual wellness or annual wellness exam)  Virtual Visit via Phone Note  I connected with Paul Schwartz on 03/17/23  by phone and verified that I am speaking with the correct person using two identifiers.  Location patient: home Location provider:work or home office Persons participating in the virtual visit: patient, provider, patient's wife  Concerns and/or follow up today: stable - just saw Kandee Keen recently. He had a fall when climbing a ladder and went to see PCP. Doing ok now though.    See HM section in Epic for other details of completed HM.    ROS: negative for report of fevers, unintentional weight loss, vision changes, vision loss, hearing loss or change, chest pain, sob, hemoptysis, melena, hematochezia, falls, bleeding or bruising, thoughts of suicide or self harm, memory loss Sees urologist for his  prostate.  Patient-completed extensive health risk assessment - reviewed and discussed with the patient: See Health Risk Assessment completed with patient prior to the visit either above or in recent phone note. This was reviewed in detailed with the patient  today and appropriate recommendations, orders and referrals were placed as needed per Summary below and patient instructions.   Review of Medical History: -PMH, PSH, Family History and current specialty and care providers reviewed and updated and listed below   Patient Care Team: Paul Frees, NP as PCP - General (Family Medicine) Eugenia Mcalpine, MD as Consulting Physician (Orthopedic Surgery) Crista Elliot, MD as Consulting Physician (Urology)   Past Medical History:  Diagnosis Date   Arthritis    Bilateral shoulder pain    BPH (benign prostatic hyperplasia)    GERD (gastroesophageal reflux disease)    History of kidney stones     Past Surgical History:  Procedure Laterality Date   achilles tendon Right    surgical repair bilat   CATARACT EXTRACTION, BILATERAL     COLONOSCOPY     REVERSE SHOULDER ARTHROPLASTY Right 08/19/2018   Procedure: REVERSE RIGHT SHOULDER ARTHROPLASTY;  Surgeon: Francena Hanly, MD;  Location: [facility] OR;  Service: Orthopedics;  Laterality: Right;    REVERSE SHOULDER ARTHROPLASTY Left 04/10/2022   Procedure: REVERSE SHOULDER ARTHROPLASTY;  Surgeon: Francena Hanly, MD;  Location: [facility];  Service: Orthopedics;  Laterality: Left;     Social History   Socioeconomic History   Marital status: Married    Spouse name: Not on file   Number of children: Not on file   Years of education: Not on file   Highest education level: Not on file  Occupational History   Not on file  Tobacco Use   Smoking status: Former    Packs/day: 1.00    Years: 10.00    Additional pack years: 0.00    Total pack years: 10.00    Types: Cigarettes    Quit date: 04/07/1968    Years since quitting: 54.9   Smokeless tobacco: Never  Vaping Use   Vaping Use: Never used  Substance and Sexual Activity   Alcohol use: Yes    Alcohol/week: 4.0 standard drinks of alcohol    Types: 4 Glasses of wine per week    Comment: weekends   Drug use: No   Sexual activity: Yes   Other Topics Concern   Not on file  Social History Narrative   Retired    Married - 50 years    One son that lives in [place]    2 grandchildren    Social Determinants of Health   Financial Resource Strain: Low Risk  (03/14/2022)   Overall Financial Resource Strain (CARDIA)    Difficulty of Paying Living Expenses: Not hard at all  Food Insecurity: No Food Insecurity (03/14/2022)   Hunger Vital Sign    Worried About Running Out of Food in the Last Year: Never true    Ran Out of Food in the Last Year: Never true  Transportation Needs: No Transportation Needs (03/14/2022)   PRAPARE - Administrator, Civil Service (Medical): No    Lack of Transportation (Non-Medical): No  Physical Activity: Insufficiently Active (03/14/2022)   Exercise Vital Sign    Days of Exercise per Week: 2 days    Minutes of Exercise per Session: 60 min  Stress: No Stress Concern Present (03/14/2022)   [employer] of Occupational Health - Occupational Stress Questionnaire    Feeling  of Stress : Not at all  Social Connections: Socially Integrated (03/14/2022)   Social Connection and Isolation Panel [NHANES]    Frequency of Communication with Friends and Family: More than three times a week    Frequency of Social Gatherings with Friends and Family: More than three times a week    Attends Religious Services: More than 4 times per year    Active Member of [employer] or Organizations: Yes    Attends Engineer, structural: More than 4 times per year    Marital Status: Married  Catering manager Violence: Not At Risk (03/14/2022)   Humiliation, Afraid, Rape, and Kick questionnaire    Fear of Current or Ex-Partner: No    Emotionally Abused: No    Physically Abused: No    Sexually Abused: No    Family History  Problem Relation Age of Onset   Heart disease Father    Colon cancer Neg Hx    Esophageal cancer Neg Hx    Stomach cancer Neg Hx    Rectal cancer Neg Hx     Current Outpatient Medications on  File Prior to Visit  Medication Sig Dispense Refill   citalopram (CELEXA) 20 MG tablet TAKE 1 TABLET BY MOUTH EVERY DAY 90 tablet 1   diclofenac Sodium (VOLTAREN) 1 % GEL Apply 1 application. topically 4 (four) times daily as needed (pain).     donepezil (ARICEPT) 10 MG tablet TAKE 1 TABLET BY MOUTH EVERYDAY AT BEDTIME 90 tablet 3   doxycycline (VIBRAMYCIN) 100 MG capsule Take 1 capsule (100 mg total) by mouth 2 (two) times daily. 14 capsule 0   Omega-3 Fatty Acids (FISH OIL TRIPLE STRENGTH) 1400 MG CAPS Take 1,400 mg by mouth daily.     predniSONE (DELTASONE) 10 MG tablet 40 mg x 3 days, 20 mg x 3 days, 10 mg x 3 days 21 tablet 0   simvastatin (ZOCOR) 5 MG tablet TAKE 1 TABLET (5 MG TOTAL) BY MOUTH DAILY. 90 tablet 3   memantine (NAMENDA) 10 MG tablet Take 1 tablet (10 mg total) by mouth 2 (two) times daily. 180 tablet 3   No current facility-administered medications on file prior to visit.    Allergies  Allergen Reactions   Plasticized Base [Plastibase] Hives and Rash       Physical Exam There were no vitals filed for this visit. Estimated body mass index is 30.54 kg/m as calculated from the following:   Height as of 03/05/23: 5\' 7"  (1.702 m).   Weight as of 03/05/23: 195 lb (88.5 kg).  EKG (optional): deferred due to virtual visit  GENERAL: alert, oriented, no acute distress detected; full vision exam deferred due to pandemic and/or virtual encounter  PSYCH/NEURO: pleasant and cooperative, no obvious depression or anxiety, speech and thought processing grossly intact, Cognitive function grossly intact  Flowsheet Row Office Visit from 01/08/2021 in [facility]  PHQ-9 Total Score 2           03/17/2023   11:53 AM 03/14/2022    1:42 PM 01/14/2022   11:06 AM 01/08/2021    7:05 AM 08/15/2020    1:04 PM  Depression screen PHQ 2/9  Decreased Interest 0 0 0 0 0  Down, Depressed, Hopeless 0 0 0 1 0  PHQ - 2 Score 0 0 0 1 0  Altered sleeping    0 1   Tired, decreased energy    0 1  Change in appetite    0 0  Feeling bad or failure about yourself     0 0  Trouble concentrating    1 0  Moving slowly or fidgety/restless    0 0  Suicidal thoughts    0 0  PHQ-9 Score    2 2  Difficult doing work/chores    Not difficult at all        01/14/2022   10:57 AM 03/14/2022    1:45 PM 04/10/2022    6:45 PM 04/11/2022   12:00 AM 03/17/2023   11:53 AM  Fall Risk  Falls in the past year? 0 0   0  Was there an injury with Fall? 0 0   0  Fall Risk Category Calculator 0 0   0  Fall Risk Category (Retired) Low Low     (RETIRED) Patient Fall Risk Level Low fall risk Low fall risk High fall risk High fall risk   Patient at Risk for Falls Due to  No Fall Risks   No Fall Risks  Fall risk Follow up     Falls evaluation completed     SUMMARY AND PLAN:  Encounter for Medicare annual wellness exam   Discussed applicable health maintenance/preventive health measures and advised and referred or ordered per patient preferences: -discussed vaccines due, recommendations and advised can get at the pharmacy  Health Maintenance  Topic Date Due   Zoster Vaccines- Shingrix (2 of 2) 12/12/2020   DTaP/Tdap/Td (3 - Td or Tdap) 09/03/2022   INFLUENZA VACCINE  06/11/2023   Medicare Annual Wellness (AWV)  03/16/2024   Pneumonia Vaccine 68+ Years old  Completed   COVID-19 Vaccine  Completed   HPV VACCINES  Aged Out   Education and counseling on the following was provided based on the above review of health and a plan/checklist for the patient, along with additional information discussed, was provided for the patient in the patient instructions :  -Provided safe balance exercises that can be done at home to improve balance and discussed exercise guidelines for adults with include balance exercises at least 3 days per week.  -Advised and counseled on a healthy lifestyle - including the importance of a healthy diet, regular physical activity, social connections and  stress management. -Reviewed patient's current diet. Advised and counseled on a whole foods based healthy diet. A summary of a healthy diet was provided in the Patient Instructions. We dicussed increasing whole foods and reducing processed foods, simple starches, sugars as he would like to reduce his weight.  -reviewed patient's current physical activity level and discussed exercise guidelines for adults. Discussed community resources and ideas for safe exercise at home to assist in meeting exercise guideline recommendations in a safe and healthy way. He says after our conversation he feels motivated to get back to the [facility]. -Advise yearly dental visits at minimum and regular eye exams -Advised and counseled on alcohol safe limits  Follow up: see patient instructions   Patient Instructions  I really enjoyed getting to talk with you today! I am available on Tuesdays and Thursdays for virtual visits if you have any questions or concerns, or if I can be of any further assistance.   CHECKLIST FROM ANNUAL WELLNESS VISIT:  -Follow up (please call to schedule if not scheduled after visit):   -yearly for annual wellness visit with primary care office  Here is a list of your preventive care/health maintenance measures and the plan for each if any are due:  PLAN For any measures below that may be due:  -  can get vaccines due at the pharmacy  Health Maintenance  Topic Date Due   Zoster Vaccines- Shingrix (2 of 2) 12/12/2020   DTaP/Tdap/Td (3 - Td or Tdap) 09/03/2022   INFLUENZA VACCINE  06/11/2023   Medicare Annual Wellness (AWV)  03/16/2024   Pneumonia Vaccine 19+ Years old  Completed   COVID-19 Vaccine  Completed   HPV VACCINES  Aged Out    -See a dentist at least yearly  -Get your eyes checked and then per your eye specialist's recommendations  -Other issues addressed today:   -I have included below further information regarding a healthy whole foods based diet, physical activity  guidelines for adults, stress management and opportunities for social connections. I hope you find this information useful.   -----------------------------------------------------------------------------------------------------------------------------------------------------------------------------------------------------------------------------------------------------------  NUTRITION: -eat real food: lots of colorful vegetables (half the plate) and fruits -5-7 servings of vegetables and fruits per day (fresh or steamed is best), exp. 2 servings of vegetables with lunch and dinner and 2 servings of fruit per day. Berries and greens such as kale and collards are great choices.  -consume on a regular basis: whole grains (make sure first ingredient on label contains the word "whole"), fresh fruits, fish, nuts, seeds, healthy oils (such as olive oil, avocado oil, grape seed oil) -may eat small amounts of dairy and lean meat on occasion, but avoid processed meats such as ham, bacon, lunch meat, etc. -drink water -try to avoid fast food and pre-packaged foods, processed meat -most experts advise limiting sodium to < 2300mg  per day, should limit further is any chronic conditions such as high blood pressure, heart disease, diabetes, etc. The American Heart Association advised that < 1500mg  is is ideal -try to avoid foods that contain any ingredients with names you do not recognize  -try to avoid sugar/sweets (except for the natural sugar that occurs in fresh fruit) -try to avoid sweet drinks -try to avoid white rice, white bread, pasta (unless whole grain), white or yellow potatoes  EXERCISE GUIDELINES FOR ADULTS: -if you wish to increase your physical activity, do so gradually and with the approval of your doctor -STOP and seek medical care immediately if you have any chest pain, chest discomfort or trouble breathing when starting or increasing exercise  -move and stretch your body, legs, feet and arms  when sitting for long periods -Physical activity guidelines for optimal health in adults: -least 150 minutes per week of aerobic exercise (can talk, but not sing) once approved by your doctor, 20-30 minutes of sustained activity or two 10 minute episodes of sustained activity every day.  -resistance training at least 2 days per week if approved by your doctor -balance exercises 3+ days per week:   Stand somewhere where you have something sturdy to hold onto if you lose balance.    1) lift up on toes, start with 5x per day and work up to 20x   2) stand and lift on leg straight out to the side so that foot is a few inches of the floor, start with 5x each side and work up to 20x each side   3) stand on one foot, start with 5 seconds each side and work up to 20 seconds on each side  If you need ideas or help with getting more active:  -Silver sneakers https://tools.silversneakers.com  -Walk with a Doc: http://www.duncan-williams.com/  -try to include resistance (weight lifting/strength building) and balance exercises twice per week: or the following link for ideas: http://castillo-powell.com/  BuyDucts.dk  STRESS MANAGEMENT: -  can try meditating, or just sitting quietly with deep breathing while intentionally relaxing all parts of your body for 5 minutes daily -if you need further help with stress, anxiety or depression please follow up with your primary doctor or contact the wonderful folks at [facility]: [phone]  SOCIAL CONNECTIONS: -options in [place] if you wish to engage in more social and exercise related activities:  -Silver sneakers https://tools.silversneakers.com  -Walk with a Doc: http://www.duncan-williams.com/  -Check out the [facility] Active Adults 50+ section on the [place] of [employer] (hiking clubs, book clubs, cards and games, chess, exercise classes, aquatic  classes and much more) - see the website for details: https://www.[place].gov/departments/parks-recreation/active-adults50  -YouTube has lots of exercise videos for different ages and abilities as well  -[facility] (a variety of indoor and outdoor inperson activities for adults). [phone]. [address]  -Virtual Online Classes (a variety of topics): see seniorplanet.org or call [phone]  -consider volunteering at a school, hospice center, church, senior center or elsewhere           Terressa Koyanagi, DO

## 2023-03-24 DIAGNOSIS — R3914 Feeling of incomplete bladder emptying: Secondary | ICD-10-CM | POA: Diagnosis not present

## 2023-03-24 DIAGNOSIS — N210 Calculus in bladder: Secondary | ICD-10-CM | POA: Diagnosis not present

## 2023-04-07 ENCOUNTER — Ambulatory Visit: Payer: Medicare Other | Admitting: Adult Health

## 2023-04-08 ENCOUNTER — Other Ambulatory Visit: Payer: Self-pay | Admitting: Ophthalmology

## 2023-04-08 DIAGNOSIS — R3912 Poor urinary stream: Secondary | ICD-10-CM | POA: Diagnosis not present

## 2023-04-08 DIAGNOSIS — R3914 Feeling of incomplete bladder emptying: Secondary | ICD-10-CM | POA: Diagnosis not present

## 2023-04-08 DIAGNOSIS — N210 Calculus in bladder: Secondary | ICD-10-CM | POA: Diagnosis not present

## 2023-04-16 DIAGNOSIS — N3000 Acute cystitis without hematuria: Secondary | ICD-10-CM | POA: Diagnosis not present

## 2023-04-28 ENCOUNTER — Encounter: Payer: Self-pay | Admitting: Adult Health

## 2023-04-28 ENCOUNTER — Ambulatory Visit (INDEPENDENT_AMBULATORY_CARE_PROVIDER_SITE_OTHER): Payer: Medicare Other | Admitting: Adult Health

## 2023-04-28 VITALS — BP 120/80 | HR 75 | Temp 98.3°F | Ht 67.0 in | Wt 185.0 lb

## 2023-04-28 DIAGNOSIS — F419 Anxiety disorder, unspecified: Secondary | ICD-10-CM | POA: Diagnosis not present

## 2023-04-28 MED ORDER — BUPROPION HCL ER (XL) 150 MG PO TB24
150.0000 mg | ORAL_TABLET | Freq: Every day | ORAL | 0 refills | Status: DC
Start: 2023-04-28 — End: 2023-05-13

## 2023-04-28 NOTE — Progress Notes (Signed)
Subjective:    Patient ID: Paul Schwartz, male    DOB: [birth year], 82 y.o.   MRN: [id number]  HPI 82 year old male who  has a past medical history of Arthritis, Bilateral shoulder pain, BPH (benign prostatic hyperplasia), GERD (gastroesophageal reflux disease), and History of kidney stones.  He presents to the office today with his wife. His wife feels as though he feels more anxious as of lately. He does not disagree with this. He feels as though his anxiety comes from being older and his health. He does not feel depressed and is taking Celexa 20 mg daily   Review of Systems See HPI   Past Medical History:  Diagnosis Date   Arthritis    Bilateral shoulder pain    BPH (benign prostatic hyperplasia)    GERD (gastroesophageal reflux disease)    History of kidney stones     Social History   Socioeconomic History   Marital status: Married    Spouse name: Not on file   Number of children: Not on file   Years of education: Not on file   Highest education level: Not on file  Occupational History   Not on file  Tobacco Use   Smoking status: Former    Packs/day: 1.00    Years: 10.00    Additional pack years: 0.00    Total pack years: 10.00    Types: Cigarettes    Quit date: 04/07/1968    Years since quitting: 55.0   Smokeless tobacco: Never  Vaping Use   Vaping Use: Never used  Substance and Sexual Activity   Alcohol use: Yes    Alcohol/week: 4.0 standard drinks of alcohol    Types: 4 Glasses of wine per week    Comment: weekends   Drug use: No   Sexual activity: Yes  Other Topics Concern   Not on file  Social History Narrative   Retired    Married - 50 years    One son that lives in [place]    2 grandchildren    Social Determinants of Health   Financial Resource Strain: Low Risk  (03/14/2022)   Overall Financial Resource Strain (CARDIA)    Difficulty of Paying Living Expenses: Not hard at all  Food Insecurity: No Food Insecurity (03/14/2022)   Hunger Vital  Sign    Worried About Running Out of Food in the Last Year: Never true    Ran Out of Food in the Last Year: Never true  Transportation Needs: No Transportation Needs (03/14/2022)   PRAPARE - Administrator, Civil Service (Medical): No    Lack of Transportation (Non-Medical): No  Physical Activity: Insufficiently Active (03/14/2022)   Exercise Vital Sign    Days of Exercise per Week: 2 days    Minutes of Exercise per Session: 60 min  Stress: No Stress Concern Present (03/14/2022)   [employer] of Occupational Health - Occupational Stress Questionnaire    Feeling of Stress : Not at all  Social Connections: Socially Integrated (03/14/2022)   Social Connection and Isolation Panel [NHANES]    Frequency of Communication with Friends and Family: More than three times a week    Frequency of Social Gatherings with Friends and Family: More than three times a week    Attends Religious Services: More than 4 times per year    Active Member of [employer] or Organizations: Yes    Attends Banker Meetings: More than 4 times per year  Marital Status: Married  Catering manager Violence: Not At Risk (03/14/2022)   Humiliation, Afraid, Rape, and Kick questionnaire    Fear of Current or Ex-Partner: No    Emotionally Abused: No    Physically Abused: No    Sexually Abused: No    Past Surgical History:  Procedure Laterality Date   achilles tendon Right    surgical repair bilat   CATARACT EXTRACTION, BILATERAL     COLONOSCOPY     REVERSE SHOULDER ARTHROPLASTY Right 08/19/2018   Procedure: REVERSE RIGHT SHOULDER ARTHROPLASTY;  Surgeon: Francena Hanly, MD;  Location: [facility] OR;  Service: Orthopedics;  Laterality: Right;    REVERSE SHOULDER ARTHROPLASTY Left 04/10/2022   Procedure: REVERSE SHOULDER ARTHROPLASTY;  Surgeon: Francena Hanly, MD;  Location: [facility];  Service: Orthopedics;  Laterality: Left;     Family History  Problem Relation Age of Onset   Heart disease Father     Colon cancer Neg Hx    Esophageal cancer Neg Hx    Stomach cancer Neg Hx    Rectal cancer Neg Hx     Allergies  Allergen Reactions   Plasticized Base [Plastibase] Hives and Rash    Current Outpatient Medications on File Prior to Visit  Medication Sig Dispense Refill   citalopram (CELEXA) 20 MG tablet TAKE 1 TABLET BY MOUTH EVERY DAY 90 tablet 1   diclofenac Sodium (VOLTAREN) 1 % GEL Apply 1 application. topically 4 (four) times daily as needed (pain).     donepezil (ARICEPT) 10 MG tablet TAKE 1 TABLET BY MOUTH EVERYDAY AT BEDTIME 90 tablet 3   doxycycline (VIBRAMYCIN) 100 MG capsule Take 1 capsule (100 mg total) by mouth 2 (two) times daily. 14 capsule 0   Omega-3 Fatty Acids (FISH OIL TRIPLE STRENGTH) 1400 MG CAPS Take 1,400 mg by mouth daily.     predniSONE (DELTASONE) 10 MG tablet 40 mg x 3 days, 20 mg x 3 days, 10 mg x 3 days 21 tablet 0   simvastatin (ZOCOR) 5 MG tablet TAKE 1 TABLET (5 MG TOTAL) BY MOUTH DAILY. 90 tablet 3   memantine (NAMENDA) 10 MG tablet Take 1 tablet (10 mg total) by mouth 2 (two) times daily. 180 tablet 3   No current facility-administered medications on file prior to visit.    BP 120/80   Pulse 75   Temp 98.3 F (36.8 C) (Oral)   Ht 5\' 7"  (1.702 m)   Wt 185 lb (83.9 kg)   SpO2 98%   BMI 28.98 kg/m       Objective:   Physical Exam Vitals and nursing note reviewed.  Constitutional:      Appearance: Normal appearance.  Cardiovascular:     Rate and Rhythm: Normal rate and regular rhythm.     Pulses: Normal pulses.     Heart sounds: Normal heart sounds.  Pulmonary:     Effort: Pulmonary effort is normal.     Breath sounds: Normal breath sounds.  Musculoskeletal:        General: Normal range of motion.  Skin:    General: Skin is warm and dry.  Neurological:     General: No focal deficit present.     Mental Status: He is alert and oriented to person, place, and time.  Psychiatric:        Mood and Affect: Mood normal.        Behavior:  Behavior normal.        Thought Content: Thought content normal.  Judgment: Judgment normal.       Assessment & Plan:  1. Anxiety - GAD 7 = 5 - Will add on Wellbutrin to his regimen  - buPROPion (WELLBUTRIN XL) 150 MG 24 hr tablet; Take 1 tablet (150 mg total) by mouth daily.  Dispense: 30 tablet; Refill: 0 - Follow up in 30 days or sooner if needed  Shirline Frees, NP

## 2023-05-09 ENCOUNTER — Other Ambulatory Visit: Payer: Self-pay | Admitting: Adult Health

## 2023-05-09 DIAGNOSIS — F419 Anxiety disorder, unspecified: Secondary | ICD-10-CM

## 2023-05-19 ENCOUNTER — Telehealth: Payer: Self-pay | Admitting: Adult Health

## 2023-05-19 NOTE — Telephone Encounter (Signed)
Pt wife called would like to know if pt can be put on K[place]la the new Dementia medication. Please advise Mrs Feightner.

## 2023-05-19 NOTE — Telephone Encounter (Signed)
Ok to do a visit?

## 2023-05-20 NOTE — Telephone Encounter (Signed)
Patient spouse notified of update  and verbalized understanding. 

## 2023-06-02 ENCOUNTER — Ambulatory Visit: Payer: Medicare Other | Admitting: Adult Health

## 2023-06-17 ENCOUNTER — Telehealth: Payer: Self-pay | Admitting: Adult Health

## 2023-06-17 NOTE — Telephone Encounter (Signed)
Pt wife Marcela called and would like a referral to a Neurologist.

## 2023-06-18 NOTE — Telephone Encounter (Signed)
Tried to call pt and spouse but its going to vm. Lm for return call.

## 2023-06-19 NOTE — Telephone Encounter (Signed)
Spoke to pt spouse and she stated that no referral need to be placed. She stated that her newphew had a test and wanted pt to have the same test. However, pt is past the age requirement.

## 2023-07-09 DIAGNOSIS — H40013 Open angle with borderline findings, low risk, bilateral: Secondary | ICD-10-CM | POA: Diagnosis not present

## 2023-07-11 ENCOUNTER — Other Ambulatory Visit: Payer: Self-pay | Admitting: Adult Health

## 2023-08-20 ENCOUNTER — Encounter: Payer: Self-pay | Admitting: Adult Health

## 2023-08-20 ENCOUNTER — Ambulatory Visit: Payer: Medicare Other | Admitting: Adult Health

## 2023-08-20 VITALS — BP 120/80 | HR 68 | Temp 98.1°F | Ht 67.0 in | Wt 195.0 lb

## 2023-08-20 DIAGNOSIS — H05223 Edema of bilateral orbit: Secondary | ICD-10-CM | POA: Diagnosis not present

## 2023-08-20 NOTE — Progress Notes (Signed)
Subjective:    Patient ID: Paul Schwartz, male    DOB: [birth year], 82 y.o.   MRN: [id number]  HPI 82 year old male who  has a past medical history of Arthritis, Bilateral shoulder pain, BPH (benign prostatic hyperplasia), GERD (gastroesophageal reflux disease), and History of kidney stones.  11-year-old male who is being evaluated today for swollen eyes.  He is with his wife today who helps provide history due to patient having cognitive impairment from likely dementia.  She reports that yesterday he had swelling around the top of his eyes.  Today the swelling has resolved.  Patient denies itchy eyes, feeling of a foreign body in his eyes, or any allergy-like symptoms.  Review of Systems See HPI   Past Medical History:  Diagnosis Date   Arthritis    Bilateral shoulder pain    BPH (benign prostatic hyperplasia)    GERD (gastroesophageal reflux disease)    History of kidney stones     Social History   Socioeconomic History   Marital status: Married    Spouse name: Not on file   Number of children: Not on file   Years of education: Not on file   Highest education level: Not on file  Occupational History   Not on file  Tobacco Use   Smoking status: Former    Current packs/day: 0.00    Average packs/day: 1 pack/day for 10.0 years (10.0 ttl pk-yrs)    Types: Cigarettes    Start date: 04/07/1958    Quit date: 04/07/1968    Years since quitting: 55.4   Smokeless tobacco: Never  Vaping Use   Vaping status: Never Used  Substance and Sexual Activity   Alcohol use: Yes    Alcohol/week: 4.0 standard drinks of alcohol    Types: 4 Glasses of wine per week    Comment: weekends   Drug use: No   Sexual activity: Yes  Other Topics Concern   Not on file  Social History Narrative   Retired    Married - 50 years    One son that lives in [place]    2 grandchildren    Social Determinants of Health   Financial Resource Strain: Low Risk  (03/14/2022)   Overall Financial Resource  Strain (CARDIA)    Difficulty of Paying Living Expenses: Not hard at all  Food Insecurity: No Food Insecurity (03/14/2022)   Hunger Vital Sign    Worried About Running Out of Food in the Last Year: Never true    Ran Out of Food in the Last Year: Never true  Transportation Needs: No Transportation Needs (03/14/2022)   PRAPARE - Administrator, Civil Service (Medical): No    Lack of Transportation (Non-Medical): No  Physical Activity: Insufficiently Active (03/14/2022)   Exercise Vital Sign    Days of Exercise per Week: 2 days    Minutes of Exercise per Session: 60 min  Stress: No Stress Concern Present (03/14/2022)   [employer] of Occupational Health - Occupational Stress Questionnaire    Feeling of Stress : Not at all  Social Connections: Socially Integrated (03/14/2022)   Social Connection and Isolation Panel [NHANES]    Frequency of Communication with Friends and Family: More than three times a week    Frequency of Social Gatherings with Friends and Family: More than three times a week    Attends Religious Services: More than 4 times per year    Active Member of [employer] or Organizations: Yes  Attends Banker Meetings: More than 4 times per year    Marital Status: Married  Catering manager Violence: Not At Risk (03/14/2022)   Humiliation, Afraid, Rape, and Kick questionnaire    Fear of Current or Ex-Partner: No    Emotionally Abused: No    Physically Abused: No    Sexually Abused: No    Past Surgical History:  Procedure Laterality Date   achilles tendon Right    surgical repair bilat   CATARACT EXTRACTION, BILATERAL     COLONOSCOPY     REVERSE SHOULDER ARTHROPLASTY Right 08/19/2018   Procedure: REVERSE RIGHT SHOULDER ARTHROPLASTY;  Surgeon: Francena Hanly, MD;  Location: [facility];  Service: Orthopedics;  Laterality: Right;    REVERSE SHOULDER ARTHROPLASTY Left 04/10/2022   Procedure: REVERSE SHOULDER ARTHROPLASTY;  Surgeon: Francena Hanly, MD;  Location:  [facility];  Service: Orthopedics;  Laterality: Left;     Family History  Problem Relation Age of Onset   Heart disease Father    Colon cancer Neg Hx    Esophageal cancer Neg Hx    Stomach cancer Neg Hx    Rectal cancer Neg Hx     Allergies  Allergen Reactions   Plasticized Base [Plastibase] Hives and Rash    Current Outpatient Medications on File Prior to Visit  Medication Sig Dispense Refill   buPROPion (WELLBUTRIN XL) 150 MG 24 hr tablet TAKE 1 TABLET BY MOUTH EVERY DAY 30 tablet 0   citalopram (CELEXA) 20 MG tablet TAKE 1 TABLET BY MOUTH EVERY DAY 90 tablet 1   diclofenac Sodium (VOLTAREN) 1 % GEL Apply 1 application. topically 4 (four) times daily as needed (pain).     donepezil (ARICEPT) 10 MG tablet TAKE 1 TABLET BY MOUTH EVERYDAY AT BEDTIME 90 tablet 3   memantine (NAMENDA) 10 MG tablet TAKE 1 TABLET BY MOUTH TWICE A DAY 180 tablet 3   Omega-3 Fatty Acids (FISH OIL TRIPLE STRENGTH) 1400 MG CAPS Take 1,400 mg by mouth daily.     simvastatin (ZOCOR) 5 MG tablet TAKE 1 TABLET (5 MG TOTAL) BY MOUTH DAILY. 90 tablet 3   No current facility-administered medications on file prior to visit.    BP 120/80   Pulse 68   Temp 98.1 F (36.7 C) (Oral)   Ht 5\' 7"  (1.702 m)   Wt 195 lb (88.5 kg)   SpO2 96%   BMI 30.54 kg/m       Objective:   Physical Exam Vitals and nursing note reviewed.  Constitutional:      Appearance: Normal appearance.  Eyes:     General: Lids are normal.     Extraocular Movements: Extraocular movements intact.     Conjunctiva/sclera: Conjunctivae normal.     Comments: No peri orbital edema noted  Musculoskeletal:        General: Normal range of motion.  Skin:    General: Skin is warm and dry.  Neurological:     General: No focal deficit present.     Mental Status: He is alert and oriented to person, place, and time.  Psychiatric:        Mood and Affect: Mood normal.        Behavior: Behavior normal.        Thought Content: Thought content  normal.        Judgment: Judgment normal.       Assessment & Plan:  1. Edema of both orbits - Resolved?  - Unknown cause. Possibly allergy mediated  Shirline Frees, NP

## 2023-08-27 DIAGNOSIS — D485 Neoplasm of uncertain behavior of skin: Secondary | ICD-10-CM | POA: Diagnosis not present

## 2023-08-27 DIAGNOSIS — L820 Inflamed seborrheic keratosis: Secondary | ICD-10-CM | POA: Diagnosis not present

## 2023-08-27 DIAGNOSIS — L821 Other seborrheic keratosis: Secondary | ICD-10-CM | POA: Diagnosis not present

## 2023-09-04 ENCOUNTER — Encounter: Payer: Medicare Other | Admitting: Adult Health

## 2023-09-04 NOTE — Progress Notes (Deleted)
Subjective:    Patient ID: Paul Schwartz, male    DOB: [birth year], 82 y.o.   MRN: [id number]  HPI Patient presents for yearly preventative medicine examination. He is a pleasant 82 year old male who  has a past medical history of Arthritis, Bilateral shoulder pain, BPH (benign prostatic hyperplasia), GERD (gastroesophageal reflux disease), and History of kidney stones.  Anxiety-controlled on Celexa 10 mg daily and Wellbutrin 150 mg ER.  His symptoms seem to be well controlled  History of BPH-followed by urology on a routine basis.  Denies symptoms.   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 His wife feels like the medication is helping with his memory. No new deficits  Hyperlipidemia - mildly elevated LDL in the past. Not currently on statin. Does take omege 3    All immunizations and health maintenance protocols were reviewed with the patient and needed orders were placed.  Appropriate screening laboratory values were ordered for the patient including screening of hyperlipidemia, renal function and hepatic function. If indicated by BPH, a PSA was ordered.  Medication reconciliation,  past medical history, social history, problem list and allergies were reviewed in detail with the patient  Goals were established with regard to weight loss, exercise, and  diet in compliance with medications  Review of Systems  Constitutional: Negative.   HENT: Negative.    Eyes: Negative.   Respiratory: Negative.    Cardiovascular: Negative.   Gastrointestinal: Negative.   Endocrine: Negative.   Genitourinary: Negative.   Musculoskeletal: Negative.   Skin: Negative.   Allergic/Immunologic: Negative.   Neurological: Negative.   Hematological: Negative.   Psychiatric/Behavioral: Negative.    All other systems reviewed and are negative.  Past Medical History:  Diagnosis Date   Arthritis    Bilateral shoulder pain    BPH (benign prostatic hyperplasia)     GERD (gastroesophageal reflux disease)    History of kidney stones     Social History   Socioeconomic History   Marital status: Married    Spouse name: Not on file   Number of children: Not on file   Years of education: Not on file   Highest education level: Not on file  Occupational History   Not on file  Tobacco Use   Smoking status: Former    Current packs/day: 0.00    Average packs/day: 1 pack/day for 10.0 years (10.0 ttl pk-yrs)    Types: Cigarettes    Start date: 04/07/1958    Quit date: 04/07/1968    Years since quitting: 55.4   Smokeless tobacco: Never  Vaping Use   Vaping status: Never Used  Substance and Sexual Activity   Alcohol use: Yes    Alcohol/week: 4.0 standard drinks of alcohol    Types: 4 Glasses of wine per week    Comment: weekends   Drug use: No   Sexual activity: Yes  Other Topics Concern   Not on file  Social History Narrative   Retired    Married - 50 years    One son that lives in [place]    2 grandchildren    Social Determinants of Health   Financial Resource Strain: Low Risk  (03/14/2022)   Overall Financial Resource Strain (CARDIA)    Difficulty of Paying Living Expenses: Not hard at all  Food Insecurity: No Food Insecurity (03/14/2022)   Hunger Vital Sign    Worried About Running Out of Food in the Last Year: Never true  Ran Out of Food in the Last Year: Never true  Transportation Needs: No Transportation Needs (03/14/2022)   PRAPARE - Administrator, Civil Service (Medical): No    Lack of Transportation (Non-Medical): No  Physical Activity: Insufficiently Active (03/14/2022)   Exercise Vital Sign    Days of Exercise per Week: 2 days    Minutes of Exercise per Session: 60 min  Stress: No Stress Concern Present (03/14/2022)   [employer] of Occupational Health - Occupational Stress Questionnaire    Feeling of Stress : Not at all  Social Connections: Socially Integrated (03/14/2022)   Social Connection and Isolation  Panel [NHANES]    Frequency of Communication with Friends and Family: More than three times a week    Frequency of Social Gatherings with Friends and Family: More than three times a week    Attends Religious Services: More than 4 times per year    Active Member of [employer] or Organizations: Yes    Attends Banker Meetings: More than 4 times per year    Marital Status: Married  Catering manager Violence: Not At Risk (03/14/2022)   Humiliation, Afraid, Rape, and Kick questionnaire    Fear of Current or Ex-Partner: No    Emotionally Abused: No    Physically Abused: No    Sexually Abused: No    Past Surgical History:  Procedure Laterality Date   achilles tendon Right    surgical repair bilat   CATARACT EXTRACTION, BILATERAL     COLONOSCOPY     REVERSE SHOULDER ARTHROPLASTY Right 08/19/2018   Procedure: REVERSE RIGHT SHOULDER ARTHROPLASTY;  Surgeon: Francena Hanly, MD;  Location: [facility];  Service: Orthopedics;  Laterality: Right;    REVERSE SHOULDER ARTHROPLASTY Left 04/10/2022   Procedure: REVERSE SHOULDER ARTHROPLASTY;  Surgeon: Francena Hanly, MD;  Location: [facility];  Service: Orthopedics;  Laterality: Left;     Family History  Problem Relation Age of Onset   Heart disease Father    Colon cancer Neg Hx    Esophageal cancer Neg Hx    Stomach cancer Neg Hx    Rectal cancer Neg Hx     Allergies  Allergen Reactions   Plasticized Base [Plastibase] Hives and Rash    Current Outpatient Medications on File Prior to Visit  Medication Sig Dispense Refill   buPROPion (WELLBUTRIN XL) 150 MG 24 hr tablet TAKE 1 TABLET BY MOUTH EVERY DAY 30 tablet 0   citalopram (CELEXA) 20 MG tablet TAKE 1 TABLET BY MOUTH EVERY DAY 90 tablet 1   diclofenac Sodium (VOLTAREN) 1 % GEL Apply 1 application. topically 4 (four) times daily as needed (pain).     donepezil (ARICEPT) 10 MG tablet TAKE 1 TABLET BY MOUTH EVERYDAY AT BEDTIME 90 tablet 3   memantine (NAMENDA) 10 MG tablet TAKE 1  TABLET BY MOUTH TWICE A DAY 180 tablet 3   Omega-3 Fatty Acids (FISH OIL TRIPLE STRENGTH) 1400 MG CAPS Take 1,400 mg by mouth daily.     simvastatin (ZOCOR) 5 MG tablet TAKE 1 TABLET (5 MG TOTAL) BY MOUTH DAILY. 90 tablet 3   No current facility-administered medications on file prior to visit.    There were no vitals taken for this visit.      Objective:   Physical Exam Vitals and nursing note reviewed.  Constitutional:      General: He is not in acute distress.    Appearance: Normal appearance. He is not ill-appearing.  HENT:  Head: Normocephalic and atraumatic.     Right Ear: Tympanic membrane, ear canal and external ear normal. There is no impacted cerumen.     Left Ear: Tympanic membrane, ear canal and external ear normal. There is no impacted cerumen.     Nose: Nose normal. No congestion or rhinorrhea.     Mouth/Throat:     Mouth: Mucous membranes are moist.     Pharynx: Oropharynx is clear.  Eyes:     Extraocular Movements: Extraocular movements intact.     Conjunctiva/sclera: Conjunctivae normal.     Pupils: Pupils are equal, round, and reactive to light.  Neck:     Vascular: No carotid bruit.  Cardiovascular:     Rate and Rhythm: Normal rate and regular rhythm.     Pulses: Normal pulses.     Heart sounds: No murmur heard.    No friction rub. No gallop.  Pulmonary:     Effort: Pulmonary effort is normal.     Breath sounds: Normal breath sounds.  Abdominal:     General: Abdomen is flat. Bowel sounds are normal. There is no distension.     Palpations: Abdomen is soft. There is no mass.     Tenderness: There is no abdominal tenderness. There is no guarding or rebound.     Hernia: No hernia is present.  Musculoskeletal:        General: Normal range of motion.     Cervical back: Normal range of motion and neck supple.  Lymphadenopathy:     Cervical: No cervical adenopathy.  Skin:    General: Skin is warm and dry.     Capillary Refill: Capillary refill takes  less than 2 seconds.  Neurological:     General: No focal deficit present.     Mental Status: He is alert and oriented to person, place, and time.  Psychiatric:        Mood and Affect: Mood normal.        Behavior: Behavior normal.        Thought Content: Thought content normal.        Judgment: Judgment normal.       Assessment & Plan:

## 2023-09-18 ENCOUNTER — Encounter: Payer: Medicare Other | Admitting: Adult Health

## 2023-09-18 DIAGNOSIS — Z23 Encounter for immunization: Secondary | ICD-10-CM

## 2023-10-23 ENCOUNTER — Encounter: Payer: Medicare Other | Admitting: Adult Health

## 2023-10-23 NOTE — Progress Notes (Unsigned)
Subjective:    Patient ID: Paul Schwartz, male    DOB: 07/11/1941, 82 y.o.   MRN: [id number]  HPI Patient presents for yearly preventative medicine examination. He is a pleasant 82 year old male who  has a past medical history of Arthritis, Bilateral shoulder pain, BPH (benign prostatic hyperplasia), GERD (gastroesophageal reflux disease), and History of kidney stones.  Anxiety-controlled on Celexa 20 mg daily and wellbutrin 150 mg XR.  His symptoms seem to be well controlled  History of BPH-followed by urology on a routine basis.  Denies symptoms.   Mild cognitive impairment -  This diagnoses was made in September 2022 by Neuropsych. He is currently maintained on Arciept 10 mg and Namenda 10 mg. There was some improvement in his symptoms but over the last few months he and his wife have noticed increased confusion and forgetting things around the house such as keys, wallet and phone. His wife reports " someday's he has good days and he is not confused and other days he seems more confused. Thankfully he is always happy".   Hyperlipidemia - managed with Simvastatin 5 mg daily  Lab Results  Component Value Date   CHOL 191 01/14/2022   HDL 66.40 01/14/2022   LDLCALC 108 (H) 01/14/2022   LDLDIRECT 138.9 09/02/2013   TRIG 83.0 01/14/2022   CHOLHDL 3 01/14/2022   All immunizations and health maintenance protocols were reviewed with the patient and needed orders were placed.  Appropriate screening laboratory values were ordered for the patient including screening of hyperlipidemia, renal function and hepatic function. If indicated by BPH, a PSA was ordered.  Medication reconciliation,  past medical history, social history, problem list and allergies were reviewed in detail with the patient  Goals were established with regard to weight loss, exercise, and  diet in compliance with medications Wt Readings from Last 3 Encounters:  08/20/23 195 lb (88.5 kg)  04/28/23 185 lb (83.9 kg)   03/05/23 195 lb (88.5 kg)     Review of Systems  Constitutional: Negative.   HENT: Negative.    Eyes: Negative.   Respiratory: Negative.    Cardiovascular: Negative.   Gastrointestinal: Negative.   Endocrine: Negative.   Genitourinary: Negative.   Musculoskeletal: Negative.   Skin: Negative.   Allergic/Immunologic: Negative.   Neurological: Negative.   Hematological: Negative.   Psychiatric/Behavioral:  Positive for confusion.   All other systems reviewed and are negative.  Past Medical History:  Diagnosis Date   Arthritis    Bilateral shoulder pain    BPH (benign prostatic hyperplasia)    GERD (gastroesophageal reflux disease)    History of kidney stones     Social History   Socioeconomic History   Marital status: Married    Spouse name: Not on file   Number of children: Not on file   Years of education: Not on file   Highest education level: Not on file  Occupational History   Not on file  Tobacco Use   Smoking status: Former    Current packs/day: 0.00    Average packs/day: 1 pack/day for 10.0 years (10.0 ttl pk-yrs)    Types: Cigarettes    Start date: 04/07/1958    Quit date: 04/07/1968    Years since quitting: 55.5   Smokeless tobacco: Never  Vaping Use   Vaping status: Never Used  Substance and Sexual Activity   Alcohol use: Yes    Alcohol/week: 4.0 standard drinks of alcohol    Types: 4 Glasses of wine per  week    Comment: weekends   Drug use: No   Sexual activity: Yes  Other Topics Concern   Not on file  Social History Narrative   Retired    Married - 50 years    One son that lives in [place]    2 grandchildren    Social Drivers of Health   Financial Resource Strain: Low Risk  (03/14/2022)   Overall Financial Resource Strain (CARDIA)    Difficulty of Paying Living Expenses: Not hard at all  Food Insecurity: No Food Insecurity (03/14/2022)   Hunger Vital Sign    Worried About Running Out of Food in the Last Year: Never true    Ran Out of  Food in the Last Year: Never true  Transportation Needs: No Transportation Needs (03/14/2022)   PRAPARE - Administrator, Civil Service (Medical): No    Lack of Transportation (Non-Medical): No  Physical Activity: Insufficiently Active (03/14/2022)   Exercise Vital Sign    Days of Exercise per Week: 2 days    Minutes of Exercise per Session: 60 min  Stress: No Stress Concern Present (03/14/2022)   [employer] of Occupational Health - Occupational Stress Questionnaire    Feeling of Stress : Not at all  Social Connections: Socially Integrated (03/14/2022)   Social Connection and Isolation Panel [NHANES]    Frequency of Communication with Friends and Family: More than three times a week    Frequency of Social Gatherings with Friends and Family: More than three times a week    Attends Religious Services: More than 4 times per year    Active Member of [employer] or Organizations: Yes    Attends Banker Meetings: More than 4 times per year    Marital Status: Married  Catering manager Violence: Not At Risk (03/14/2022)   Humiliation, Afraid, Rape, and Kick questionnaire    Fear of Current or Ex-Partner: No    Emotionally Abused: No    Physically Abused: No    Sexually Abused: No    Past Surgical History:  Procedure Laterality Date   achilles tendon Right    surgical repair bilat   CATARACT EXTRACTION, BILATERAL     COLONOSCOPY     REVERSE SHOULDER ARTHROPLASTY Right 08/19/2018   Procedure: REVERSE RIGHT SHOULDER ARTHROPLASTY;  Surgeon: Francena Hanly, MD;  Location: [facility];  Service: Orthopedics;  Laterality: Right;    REVERSE SHOULDER ARTHROPLASTY Left 04/10/2022   Procedure: REVERSE SHOULDER ARTHROPLASTY;  Surgeon: Francena Hanly, MD;  Location: [facility];  Service: Orthopedics;  Laterality: Left;     Family History  Problem Relation Age of Onset   Heart disease Father    Colon cancer Neg Hx    Esophageal cancer Neg Hx    Stomach cancer Neg Hx    Rectal  cancer Neg Hx     Allergies  Allergen Reactions   Plasticized Base [Plastibase] Hives and Rash    Current Outpatient Medications on File Prior to Visit  Medication Sig Dispense Refill   buPROPion (WELLBUTRIN XL) 150 MG 24 hr tablet TAKE 1 TABLET BY MOUTH EVERY DAY 30 tablet 0   citalopram (CELEXA) 20 MG tablet TAKE 1 TABLET BY MOUTH EVERY DAY 90 tablet 1   diclofenac Sodium (VOLTAREN) 1 % GEL Apply 1 application. topically 4 (four) times daily as needed (pain).     donepezil (ARICEPT) 10 MG tablet TAKE 1 TABLET BY MOUTH EVERYDAY AT BEDTIME 90 tablet 3   memantine (NAMENDA) 10  MG tablet TAKE 1 TABLET BY MOUTH TWICE A DAY 180 tablet 3   Omega-3 Fatty Acids (FISH OIL TRIPLE STRENGTH) 1400 MG CAPS Take 1,400 mg by mouth daily.     simvastatin (ZOCOR) 5 MG tablet TAKE 1 TABLET (5 MG TOTAL) BY MOUTH DAILY. 90 tablet 3   No current facility-administered medications on file prior to visit.    There were no vitals taken for this visit.      Objective:   Physical Exam Vitals and nursing note reviewed.  Constitutional:      General: He is not in acute distress.    Appearance: Normal appearance. He is not ill-appearing.  HENT:     Head: Normocephalic and atraumatic.     Right Ear: Tympanic membrane, ear canal and external ear normal. There is no impacted cerumen.     Left Ear: Tympanic membrane, ear canal and external ear normal. There is no impacted cerumen.     Nose: Nose normal. No congestion or rhinorrhea.     Mouth/Throat:     Mouth: Mucous membranes are moist.     Pharynx: Oropharynx is clear.  Eyes:     Extraocular Movements: Extraocular movements intact.     Conjunctiva/sclera: Conjunctivae normal.     Pupils: Pupils are equal, round, and reactive to light.  Neck:     Vascular: No carotid bruit.  Cardiovascular:     Rate and Rhythm: Normal rate and regular rhythm.     Pulses: Normal pulses.     Heart sounds: No murmur heard.    No friction rub. No gallop.  Pulmonary:      Effort: Pulmonary effort is normal.     Breath sounds: Normal breath sounds.  Abdominal:     General: Abdomen is flat. Bowel sounds are normal. There is no distension.     Palpations: Abdomen is soft. There is no mass.     Tenderness: There is no abdominal tenderness. There is no guarding or rebound.     Hernia: No hernia is present.  Musculoskeletal:        General: Normal range of motion.     Cervical back: Normal range of motion and neck supple.  Lymphadenopathy:     Cervical: No cervical adenopathy.  Skin:    General: Skin is warm and dry.     Capillary Refill: Capillary refill takes less than 2 seconds.  Neurological:     General: No focal deficit present.     Mental Status: He is alert and oriented to person, place, and time.  Psychiatric:        Mood and Affect: Mood normal.        Behavior: Behavior normal.        Thought Content: Thought content normal.        Judgment: Judgment normal.           Assessment & Plan:

## 2023-11-25 ENCOUNTER — Ambulatory Visit: Payer: Medicare Other | Admitting: Adult Health

## 2023-11-25 ENCOUNTER — Encounter: Payer: Self-pay | Admitting: Adult Health

## 2023-11-25 VITALS — BP 128/78 | HR 96 | Temp 98.1°F | Ht 67.0 in | Wt 200.0 lb

## 2023-11-25 DIAGNOSIS — E785 Hyperlipidemia, unspecified: Secondary | ICD-10-CM | POA: Diagnosis not present

## 2023-11-25 DIAGNOSIS — F419 Anxiety disorder, unspecified: Secondary | ICD-10-CM

## 2023-11-25 DIAGNOSIS — N401 Enlarged prostate with lower urinary tract symptoms: Secondary | ICD-10-CM

## 2023-11-25 DIAGNOSIS — Z0000 Encounter for general adult medical examination without abnormal findings: Secondary | ICD-10-CM | POA: Diagnosis not present

## 2023-11-25 DIAGNOSIS — R351 Nocturia: Secondary | ICD-10-CM | POA: Diagnosis not present

## 2023-11-25 DIAGNOSIS — G3184 Mild cognitive impairment, so stated: Secondary | ICD-10-CM

## 2023-11-25 DIAGNOSIS — F32A Depression, unspecified: Secondary | ICD-10-CM

## 2023-11-25 LAB — LIPID PANEL
Cholesterol: 190 mg/dL (ref 0–200)
HDL: 73.1 mg/dL (ref >39.00)
LDL Cholesterol: 96 mg/dL (ref 0–99)
NonHDL: 117.31
Total CHOL/HDL Ratio: 3
Triglycerides: 109 mg/dL (ref 0.0–149.0)
VLDL: 21.8 mg/dL (ref 0.0–40.0)

## 2023-11-25 LAB — COMPREHENSIVE METABOLIC PANEL
ALT: 17 U/L (ref 0–53)
AST: 20 U/L (ref 0–37)
Albumin: 4.5 g/dL (ref 3.5–5.2)
Alkaline Phosphatase: 55 U/L (ref 39–117)
BUN: 18 mg/dL (ref 6–23)
CO2: 29 meq/L (ref 19–32)
Calcium: 9 mg/dL (ref 8.4–10.5)
Chloride: 104 meq/L (ref 96–112)
Creatinine, Ser: 1.01 mg/dL (ref 0.40–1.50)
GFR: 68.75 mL/min (ref >60.00)
Glucose, Bld: 100 mg/dL — ABNORMAL HIGH (ref 70–99)
Potassium: 3.8 meq/L (ref 3.5–5.1)
Sodium: 142 meq/L (ref 135–145)
Total Bilirubin: 0.8 mg/dL (ref 0.2–1.2)
Total Protein: 6.8 g/dL (ref 6.0–8.3)

## 2023-11-25 LAB — CBC
HCT: 45.5 % (ref 39.0–52.0)
Hemoglobin: 15.3 g/dL (ref 13.0–17.0)
MCHC: 33.7 g/dL (ref 30.0–36.0)
MCV: 96.6 fL (ref 78.0–100.0)
Platelets: 211 10*3/uL (ref 150.0–400.0)
RBC: 4.72 Mil/uL (ref 4.22–5.81)
RDW: 14.9 % (ref 11.5–15.5)
WBC: 6.3 10*3/uL (ref 4.0–10.5)

## 2023-11-25 LAB — TSH: TSH: 2.29 u[IU]/mL (ref 0.35–5.50)

## 2023-11-25 LAB — PSA: PSA: 6.11 ng/mL — ABNORMAL HIGH (ref 0.10–4.00)

## 2023-11-25 MED ORDER — CITALOPRAM HYDROBROMIDE 20 MG PO TABS: 20.0000 mg | ORAL_TABLET | Freq: Every day | ORAL | 1 refills | Status: DC

## 2023-11-25 MED ORDER — BUPROPION HCL ER (XL) 150 MG PO TB24: 150.0000 mg | ORAL_TABLET | Freq: Every day | ORAL | 1 refills | Status: DC

## 2023-11-25 MED ORDER — BUPROPION HCL ER (XL) 150 MG PO TB24
150.0000 mg | ORAL_TABLET | Freq: Every day | ORAL | 0 refills | Status: DC
Start: 2023-11-25 — End: 2023-11-25

## 2023-11-25 NOTE — Patient Instructions (Signed)
 It was great seeing you today   We will follow up with you regarding your lab work   Please let me know if you need anything

## 2023-11-25 NOTE — Progress Notes (Signed)
 Subjective:    Patient ID: Paul Schwartz, male    DOB: 1941/09/28, 83 y.o.   MRN: [id number]  HPI  Patient presents for yearly preventative medicine examination. He is a pleasant 83 year old male who  has a past medical history of Arthritis, Bilateral shoulder pain, BPH (benign prostatic hyperplasia), GERD (gastroesophageal reflux disease), and History of kidney stones.  Anxiety-controlled on Celexa  20 mg daily. Back in July Wellbutrin  was added and his wife feels as though this helped with his symptoms.    History of BPH-followed by urology on a routine basis.  Denies symptoms.   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 His wife feels like his memory has become slightly worse since the last time we saw him. She is not putting his daily medications together so he does not forget to take them. She is doing the driving but he is independent of other ADLs.   Hyperlipidemia - managed with Simvastatin  5 mg daily Lab Results  Component Value Date   CHOL 191 01/14/2022   HDL 66.40 01/14/2022   LDLCALC 108 (H) 01/14/2022   LDLDIRECT 138.9 09/02/2013   TRIG 83.0 01/14/2022   CHOLHDL 3 01/14/2022      All immunizations and health maintenance protocols were reviewed with the patient and needed orders were placed.  Appropriate screening laboratory values were ordered for the patient including screening of hyperlipidemia, renal function and hepatic function. If indicated by BPH, a PSA was ordered.  Medication reconciliation,  past medical history, social history, problem list and allergies were reviewed in detail with the patient  Goals were established with regard to weight loss, exercise, and  diet in compliance with medications. He has had a good appetite and has been snacking more.  Wt Readings from Last 3 Encounters:  11/25/23 200 lb (90.7 kg)  08/20/23 195 lb (88.5 kg)  04/28/23 185 lb (83.9 kg)   Review of Systems   Constitutional: Negative.   HENT: Negative.    Eyes: Negative.   Respiratory: Negative.    Cardiovascular: Negative.   Gastrointestinal: Negative.   Endocrine: Negative.   Genitourinary: Negative.   Musculoskeletal: Negative.   Skin: Negative.   Allergic/Immunologic: Negative.   Neurological: Negative.   Hematological: Negative.   Psychiatric/Behavioral:  Positive for confusion.   All other systems reviewed and are negative.  Past Medical History:  Diagnosis Date   Arthritis    Bilateral shoulder pain    BPH (benign prostatic hyperplasia)    GERD (gastroesophageal reflux disease)    History of kidney stones     Social History   Socioeconomic History   Marital status: Married    Spouse name: Not on file   Number of children: Not on file   Years of education: Not on file   Highest education level: Not on file  Occupational History   Not on file  Tobacco Use   Smoking status: Former    Current packs/day: 0.00    Average packs/day: 1 pack/day for 10.0 years (10.0 ttl pk-yrs)    Types: Cigarettes    Start date: 04/07/1958    Quit date: 04/07/1968    Years since quitting: 55.6   Smokeless tobacco: Never  Vaping Use   Vaping status: Never Used  Substance and Sexual Activity   Alcohol use: Yes    Alcohol/week: 4.0 standard drinks of alcohol    Types: 4 Glasses of wine per week    Comment: weekends  Drug use: No   Sexual activity: Yes  Other Topics Concern   Not on file  Social History Narrative   Retired    Married - 50 years    One son that lives in [place]    2 grandchildren    Social Drivers of Health   Financial Resource Strain: Low Risk  (03/14/2022)   Overall Financial Resource Strain (CARDIA)    Difficulty of Paying Living Expenses: Not hard at all  Food Insecurity: No Food Insecurity (03/14/2022)   Hunger Vital Sign    Worried About Running Out of Food in the Last Year: Never true    Ran Out of Food in the Last Year: Never true  Transportation Needs:  No Transportation Needs (03/14/2022)   PRAPARE - Administrator, Civil Service (Medical): No    Lack of Transportation (Non-Medical): No  Physical Activity: Insufficiently Active (03/14/2022)   Exercise Vital Sign    Days of Exercise per Week: 2 days    Minutes of Exercise per Session: 60 min  Stress: No Stress Concern Present (03/14/2022)   [employer] of Occupational Health - Occupational Stress Questionnaire    Feeling of Stress : Not at all  Social Connections: Socially Integrated (03/14/2022)   Social Connection and Isolation Panel [NHANES]    Frequency of Communication with Friends and Family: More than three times a week    Frequency of Social Gatherings with Friends and Family: More than three times a week    Attends Religious Services: More than 4 times per year    Active Member of [employer] or Organizations: Yes    Attends Banker Meetings: More than 4 times per year    Marital Status: Married  Catering manager Violence: Not At Risk (03/14/2022)   Humiliation, Afraid, Rape, and Kick questionnaire    Fear of Current or Ex-Partner: No    Emotionally Abused: No    Physically Abused: No    Sexually Abused: No    Past Surgical History:  Procedure Laterality Date   achilles tendon Right    surgical repair bilat   CATARACT EXTRACTION, BILATERAL     COLONOSCOPY     REVERSE SHOULDER ARTHROPLASTY Right 08/19/2018   Procedure: REVERSE RIGHT SHOULDER ARTHROPLASTY;  Surgeon: Ellard Gunning, MD;  Location: [facility];  Service: Orthopedics;  Laterality: Right;    REVERSE SHOULDER ARTHROPLASTY Left 04/10/2022   Procedure: REVERSE SHOULDER ARTHROPLASTY;  Surgeon: Ellard Gunning, MD;  Location: [facility];  Service: Orthopedics;  Laterality: Left;     Family History  Problem Relation Age of Onset   Heart disease Father    Colon cancer Neg Hx    Esophageal cancer Neg Hx    Stomach cancer Neg Hx    Rectal cancer Neg Hx     Allergies  Allergen Reactions    Plasticized Base [Plastibase] Hives and Rash    Current Outpatient Medications on File Prior to Visit  Medication Sig Dispense Refill   diclofenac  Sodium (VOLTAREN ) 1 % GEL Apply 1 application. topically 4 (four) times daily as needed (pain).     donepezil  (ARICEPT ) 10 MG tablet TAKE 1 TABLET BY MOUTH EVERYDAY AT BEDTIME 90 tablet 3   memantine  (NAMENDA ) 10 MG tablet TAKE 1 TABLET BY MOUTH TWICE A DAY 180 tablet 3   Omega-3 Fatty Acids (FISH OIL TRIPLE STRENGTH) 1400 MG CAPS Take 1,400 mg by mouth daily.     simvastatin  (ZOCOR ) 5 MG tablet TAKE 1 TABLET (5 MG  TOTAL) BY MOUTH DAILY. 90 tablet 3   No current facility-administered medications on file prior to visit.    BP 128/78   Pulse 96   Temp 98.1 F (36.7 C) (Oral)   Ht 5\' 7"  (1.702 m)   Wt 200 lb (90.7 kg)   SpO2 96%   BMI 31.32 kg/m       Objective:   Physical Exam Vitals and nursing note reviewed.  Constitutional:      General: He is not in acute distress.    Appearance: Normal appearance. He is not ill-appearing.  HENT:     Head: Normocephalic and atraumatic.     Right Ear: Tympanic membrane, ear canal and external ear normal. There is no impacted cerumen.     Left Ear: Tympanic membrane, ear canal and external ear normal. There is no impacted cerumen.     Nose: Nose normal. No congestion or rhinorrhea.     Mouth/Throat:     Mouth: Mucous membranes are moist.     Pharynx: Oropharynx is clear.  Eyes:     Extraocular Movements: Extraocular movements intact.     Conjunctiva/sclera: Conjunctivae normal.     Pupils: Pupils are equal, round, and reactive to light.  Neck:     Vascular: No carotid bruit.  Cardiovascular:     Rate and Rhythm: Normal rate and regular rhythm.     Pulses: Normal pulses.     Heart sounds: No murmur heard.    No friction rub. No gallop.  Pulmonary:     Effort: Pulmonary effort is normal.     Breath sounds: Normal breath sounds.  Abdominal:     General: Abdomen is flat. Bowel sounds are  normal. There is no distension.     Palpations: Abdomen is soft. There is no mass.     Tenderness: There is no abdominal tenderness. There is no guarding or rebound.     Hernia: No hernia is present.  Musculoskeletal:        General: Normal range of motion.     Cervical back: Normal range of motion and neck supple.  Lymphadenopathy:     Cervical: No cervical adenopathy.  Skin:    General: Skin is warm and dry.     Capillary Refill: Capillary refill takes less than 2 seconds.  Neurological:     General: No focal deficit present.     Mental Status: He is alert and oriented to person, place, and time. Mental status is at baseline.  Psychiatric:        Mood and Affect: Mood normal.        Behavior: Behavior normal.        Thought Content: Thought content normal.        Judgment: Judgment normal.       Assessment & Plan:  1. Routine general medical examination at a health care facility (Primary) Today patient counseled on age appropriate routine health concerns for screening and prevention, each reviewed and up to date or declined. Immunizations reviewed and up to date or declined. Labs ordered and reviewed. Risk factors for depression reviewed and negative. Hearing function and visual acuity are intact. ADLs screened and addressed as needed. Functional ability and level of safety reviewed and appropriate. Education, counseling and referrals performed based on assessed risks today. Patient provided with a copy of personalized plan for preventive services.  - Encouraged weight loss as his weight has increased. Hopefully Wellbutrin  will help curb is appetite a little bit  - Follow  up in on 6 months   2. Benign prostatic hyperplasia with nocturia - Follow up with Urology as directed  - PSA; Future  3. Mild cognitive impairment - Continue with Aricept  and Namenda   - Lipid panel; Future - TSH; Future - CBC; Future - Comprehensive metabolic panel; Future  4. Hyperlipidemia, unspecified  hyperlipidemia type - Consider increase in statin  - Lipid panel; Future - TSH; Future - CBC; Future - Comprehensive metabolic panel; Future  5. Anxiety and depression  - citalopram  (CELEXA ) 20 MG tablet; Take 1 tablet (20 mg total) by mouth daily.  Dispense: 90 tablet; Refill: 1 - buPROPion  (WELLBUTRIN  XL) 150 MG 24 hr tablet; Take 1 tablet (150 mg total) by mouth daily.  Dispense: 90 tablet; Refill: 1 - Lipid panel; Future - TSH; Future - CBC; Future - Comprehensive metabolic panel; Future  Alto Atta, NP

## 2023-12-10 ENCOUNTER — Ambulatory Visit: Payer: Self-pay | Admitting: Adult Health

## 2023-12-10 NOTE — Telephone Encounter (Signed)
Copied from CRM [id number]. Topic: Clinical - Red Word Triage >> Dec 10, 2023 11:30 AM Isabell A wrote: [facility] that prompted transfer to Nurse Triage: Spouse states left hand & wrist is swollen - states he's having a little bit of pain.  Chief Complaint: Wrist swelling Symptoms: Swelling and pain Frequency: Today Pertinent Negatives: Patient denies redness, bruising, and fever Disposition: [] ED /[] Urgent Care (no appt availability in office) / [x] Appointment(In office/virtual)/ []  [facility]/ [] Home Care/ [] Refused Recommended Disposition /[] [facility]/ []  Follow-up with PCP Additional Notes: Patient called in to report swelling in his left wrist that started this morning. Patient stated that he helped a woman with her luggage two days ago and thinks that might have caused the swelling. Patient stated that the swelling is localized to his wrist. Patient stated that he can move his fingers normally. Patient denied redness, bruising, and fever. Patient expressed that the pain is mild and not bothersome at this time. Advised patient to be seen within 3 days. Scheduled patient in the office with PCP tomorrow. Advised patient to call back if symptoms worsen. Patient complied.    Reason for Disposition  MILD OR MODERATE joint swelling (e.g., feels or looks mildly swollen or puffy)  Answer Assessment - Initial Assessment Questions 1. ONSET: "When did the swelling start?" (e.g., minutes, hours, days, weeks)     Swelling started today 2. LOCATION: "What part of the wrist is swollen?"  "Are both wrists swollen or just one wrist?"     Left wrist, denies swelling in fingers 3. SEVERITY: "How bad is the swelling?"    * NONE: No joint swelling.   * SKIN ONLY: Localized; small area of puffy or swollen skin.   * BALL OR LUMP: There is a small firm ball or lump; size of a pea, marble, or grape.   * MILD: Joint looks or feels mildly swollen or puffy.   * MODERATE: Swollen; interferes  with normal activities (e.g., work or school); decreased range of movement.   * SEVERE: Very swollen; can't move swollen joint at all; unable to hold a cup of water.     States swelling is low 4. RECURRENT SYMPTOM: "Have you had wrist swelling before?" If Yes, ask: "When was the last time?" "What happened that time?"     Denies 5. CAUSE: "What do you think is causing the wrist swelling?" (e.g., arthritis, ganglion cyst, insect bite, recent injury)     States he was helping a lady pick up her luggage two days ago 6. OTHER SYMPTOMS: "Do you have any other symptoms?" (e.g., fever, hand pain)     States the wrist is "a little" sore, denies redness, denies bruising  Protocols used: Wrist Swelling-A-AH

## 2023-12-11 ENCOUNTER — Ambulatory Visit (INDEPENDENT_AMBULATORY_CARE_PROVIDER_SITE_OTHER): Payer: Medicare Other | Admitting: Adult Health

## 2023-12-11 VITALS — BP 120/80 | HR 105 | Temp 97.6°F | Ht 67.0 in | Wt 199.0 lb

## 2023-12-11 DIAGNOSIS — M79602 Pain in left arm: Secondary | ICD-10-CM | POA: Diagnosis not present

## 2023-12-11 NOTE — Progress Notes (Signed)
Subjective:    Patient ID: Paul Schwartz, male    DOB: [birth year], 83 y.o.   MRN: [id number]  HPI 83 year old male who  has a past medical history of Arthritis, Bilateral shoulder pain, BPH (benign prostatic hyperplasia), GERD (gastroesophageal reflux disease), and History of kidney stones.  83 year old male who is being evaluated today for an acute issue.  He reports that about a week and 1/2 to 2 weeks ago he was "helping a large woman off the floor after she fell".  Since that time he has had pain to his left wrist and his wife noticed some mild swelling yesterday.  He has full range of motion but pain with doing so   Review of Systems See HPI   Past Medical History:  Diagnosis Date   Arthritis    Bilateral shoulder pain    BPH (benign prostatic hyperplasia)    GERD (gastroesophageal reflux disease)    History of kidney stones     Social History   Socioeconomic History   Marital status: Married    Spouse name: Not on file   Number of children: Not on file   Years of education: Not on file   Highest education level: Not on file  Occupational History   Not on file  Tobacco Use   Smoking status: Former    Current packs/day: 0.00    Average packs/day: 1 pack/day for 10.0 years (10.0 ttl pk-yrs)    Types: Cigarettes    Start date: 04/07/1958    Quit date: 04/07/1968    Years since quitting: 55.7   Smokeless tobacco: Never  Vaping Use   Vaping status: Never Used  Substance and Sexual Activity   Alcohol use: Yes    Alcohol/week: 4.0 standard drinks of alcohol    Types: 4 Glasses of wine per week    Comment: weekends   Drug use: No   Sexual activity: Yes  Other Topics Concern   Not on file  Social History Narrative   Retired    Married - 50 years    One son that lives in [place]    2 grandchildren    Social Drivers of Health   Financial Resource Strain: Low Risk  (03/14/2022)   Overall Financial Resource Strain (CARDIA)    Difficulty of Paying Living  Expenses: Not hard at all  Food Insecurity: No Food Insecurity (03/14/2022)   Hunger Vital Sign    Worried About Running Out of Food in the Last Year: Never true    Ran Out of Food in the Last Year: Never true  Transportation Needs: No Transportation Needs (03/14/2022)   PRAPARE - Administrator, Civil Service (Medical): No    Lack of Transportation (Non-Medical): No  Physical Activity: Insufficiently Active (03/14/2022)   Exercise Vital Sign    Days of Exercise per Week: 2 days    Minutes of Exercise per Session: 60 min  Stress: No Stress Concern Present (03/14/2022)   [employer] of Occupational Health - Occupational Stress Questionnaire    Feeling of Stress : Not at all  Social Connections: Socially Integrated (03/14/2022)   Social Connection and Isolation Panel [NHANES]    Frequency of Communication with Friends and Family: More than three times a week    Frequency of Social Gatherings with Friends and Family: More than three times a week    Attends Religious Services: More than 4 times per year    Active Member of [employer] or Organizations: Yes  Attends Banker Meetings: More than 4 times per year    Marital Status: Married  Catering manager Violence: Not At Risk (03/14/2022)   Humiliation, Afraid, Rape, and Kick questionnaire    Fear of Current or Ex-Partner: No    Emotionally Abused: No    Physically Abused: No    Sexually Abused: No    Past Surgical History:  Procedure Laterality Date   achilles tendon Right    surgical repair bilat   CATARACT EXTRACTION, BILATERAL     COLONOSCOPY     REVERSE SHOULDER ARTHROPLASTY Right 08/19/2018   Procedure: REVERSE RIGHT SHOULDER ARTHROPLASTY;  Surgeon: Francena Hanly, MD;  Location: [facility];  Service: Orthopedics;  Laterality: Right;    REVERSE SHOULDER ARTHROPLASTY Left 04/10/2022   Procedure: REVERSE SHOULDER ARTHROPLASTY;  Surgeon: Francena Hanly, MD;  Location: [facility];  Service: Orthopedics;  Laterality:  Left;     Family History  Problem Relation Age of Onset   Heart disease Father    Colon cancer Neg Hx    Esophageal cancer Neg Hx    Stomach cancer Neg Hx    Rectal cancer Neg Hx     Allergies  Allergen Reactions   Plasticized Base [Plastibase] Hives and Rash    Current Outpatient Medications on File Prior to Visit  Medication Sig Dispense Refill   buPROPion (WELLBUTRIN XL) 150 MG 24 hr tablet Take 1 tablet (150 mg total) by mouth daily. 90 tablet 1   citalopram (CELEXA) 20 MG tablet Take 1 tablet (20 mg total) by mouth daily. 90 tablet 1   diclofenac Sodium (VOLTAREN) 1 % GEL Apply 1 application. topically 4 (four) times daily as needed (pain).     donepezil (ARICEPT) 10 MG tablet TAKE 1 TABLET BY MOUTH EVERYDAY AT BEDTIME 90 tablet 3   memantine (NAMENDA) 10 MG tablet TAKE 1 TABLET BY MOUTH TWICE A DAY 180 tablet 3   Omega-3 Fatty Acids (FISH OIL TRIPLE STRENGTH) 1400 MG CAPS Take 1,400 mg by mouth daily.     simvastatin (ZOCOR) 5 MG tablet TAKE 1 TABLET (5 MG TOTAL) BY MOUTH DAILY. 90 tablet 3   No current facility-administered medications on file prior to visit.    BP 120/80   Pulse (!) 105   Temp 97.6 F (36.4 C) (Oral)   Ht 5\' 7"  (1.702 m)   Wt 199 lb (90.3 kg)   SpO2 95%   BMI 31.17 kg/m       Objective:   Physical Exam Vitals and nursing note reviewed.  Constitutional:      Appearance: Normal appearance.  Musculoskeletal:        General: Tenderness present.     Left forearm: Swelling and tenderness present.     Comments: He has some trace swelling and discomfort with palpation and movement to the distal forearm.  Skin:    General: Skin is warm and dry.  Neurological:     General: No focal deficit present.     Mental Status: He is alert and oriented to person, place, and time.  Psychiatric:        Mood and Affect: Mood normal.        Behavior: Behavior normal.        Thought Content: Thought content normal.        Judgment: Judgment normal.         Assessment & Plan:   1. Left arm pain (Primary) -This seems to be soft tissue strain.  Advised compression  with Ace bandage and ice is much as possible throughout the day for the next 3 to 4 days.  He can place Voltaren gel over the area of pain.  Follow-up if not improving in the next week  Shirline Frees, NP

## 2024-03-17 ENCOUNTER — Ambulatory Visit (INDEPENDENT_AMBULATORY_CARE_PROVIDER_SITE_OTHER): Admitting: Adult Health

## 2024-03-17 ENCOUNTER — Encounter: Payer: Self-pay | Admitting: Adult Health

## 2024-03-17 VITALS — BP 130/80 | HR 104 | Temp 98.6°F | Ht 67.0 in | Wt 200.0 lb

## 2024-03-17 DIAGNOSIS — R2681 Unsteadiness on feet: Secondary | ICD-10-CM | POA: Diagnosis not present

## 2024-03-17 DIAGNOSIS — T148XXA Other injury of unspecified body region, initial encounter: Secondary | ICD-10-CM | POA: Diagnosis not present

## 2024-03-17 NOTE — Progress Notes (Signed)
 Subjective:    Patient ID: Paul Schwartz, male    DOB: 14-Dec-1940, 83 y.o.   MRN: [id number]  Leg Pain    83 year old male who  has a past medical history of Arthritis, Bilateral shoulder pain, BPH (benign prostatic hyperplasia), GERD (gastroesophageal reflux disease), and History of kidney stones.  He reports that he four days ago he was outside in his yard and walked by a bird  and the bird " dive bombed me like a Ship broker. " He fell backwards into the grass. He did not hit his head. He had a large bruise on the back of his left leg. Pain is getting better and is ambulatory without pain. He does feel unsteady with gait often.    Review of Systems See HPI   Past Medical History:  Diagnosis Date   Arthritis    Bilateral shoulder pain    BPH (benign prostatic hyperplasia)    GERD (gastroesophageal reflux disease)    History of kidney stones     Social History   Socioeconomic History   Marital status: Married    Spouse name: Not on file   Number of children: Not on file   Years of education: Not on file   Highest education level: Not on file  Occupational History   Not on file  Tobacco Use   Smoking status: Former    Current packs/day: 0.00    Average packs/day: 1 pack/day for 10.0 years (10.0 ttl pk-yrs)    Types: Cigarettes    Start date: 04/07/1958    Quit date: 04/07/1968    Years since quitting: 55.9   Smokeless tobacco: Never  Vaping Use   Vaping status: Never Used  Substance and Sexual Activity   Alcohol use: Yes    Alcohol/week: 4.0 standard drinks of alcohol    Types: 4 Glasses of wine per week    Comment: weekends   Drug use: No   Sexual activity: Yes  Other Topics Concern   Not on file  Social History Narrative   Retired    Married - 50 years    One son that lives in [place]    2 grandchildren    Social Drivers of Health   Financial Resource Strain: Low Risk  (03/14/2022)   Overall Financial Resource Strain (CARDIA)    Difficulty of  Paying Living Expenses: Not hard at all  Food Insecurity: No Food Insecurity (03/14/2022)   Hunger Vital Sign    Worried About Running Out of Food in the Last Year: Never true    Ran Out of Food in the Last Year: Never true  Transportation Needs: No Transportation Needs (03/14/2022)   PRAPARE - Administrator, Civil Service (Medical): No    Lack of Transportation (Non-Medical): No  Physical Activity: Insufficiently Active (03/14/2022)   Exercise Vital Sign    Days of Exercise per Week: 2 days    Minutes of Exercise per Session: 60 min  Stress: No Stress Concern Present (03/14/2022)   [employer] of Occupational Health - Occupational Stress Questionnaire    Feeling of Stress : Not at all  Social Connections: Socially Integrated (03/14/2022)   Social Connection and Isolation Panel [NHANES]    Frequency of Communication with Friends and Family: More than three times a week    Frequency of Social Gatherings with Friends and Family: More than three times a week    Attends Religious Services: More than 4 times per year  Active Member of Clubs or Organizations: Yes    Attends Banker Meetings: More than 4 times per year    Marital Status: Married  Catering manager Violence: Not At Risk (03/14/2022)   Humiliation, Afraid, Rape, and Kick questionnaire    Fear of Current or Ex-Partner: No    Emotionally Abused: No    Physically Abused: No    Sexually Abused: No    Past Surgical History:  Procedure Laterality Date   achilles tendon Right    surgical repair bilat   CATARACT EXTRACTION, BILATERAL     COLONOSCOPY     REVERSE SHOULDER ARTHROPLASTY Right 08/19/2018   Procedure: REVERSE RIGHT SHOULDER ARTHROPLASTY;  Surgeon: Ellard Gunning, MD;  Location: [facility];  Service: Orthopedics;  Laterality: Right;    REVERSE SHOULDER ARTHROPLASTY Left 04/10/2022   Procedure: REVERSE SHOULDER ARTHROPLASTY;  Surgeon: Ellard Gunning, MD;  Location: [facility];  Service: Orthopedics;   Laterality: Left;     Family History  Problem Relation Age of Onset   Heart disease Father    Colon cancer Neg Hx    Esophageal cancer Neg Hx    Stomach cancer Neg Hx    Rectal cancer Neg Hx     Allergies  Allergen Reactions   Plasticized Base [Plastibase] Hives and Rash    Current Outpatient Medications on File Prior to Visit  Medication Sig Dispense Refill   buPROPion  (WELLBUTRIN  XL) 150 MG 24 hr tablet Take 1 tablet (150 mg total) by mouth daily. 90 tablet 1   citalopram  (CELEXA ) 20 MG tablet Take 1 tablet (20 mg total) by mouth daily. 90 tablet 1   diclofenac  Sodium (VOLTAREN ) 1 % GEL Apply 1 application. topically 4 (four) times daily as needed (pain).     donepezil  (ARICEPT ) 10 MG tablet TAKE 1 TABLET BY MOUTH EVERYDAY AT BEDTIME 90 tablet 3   memantine  (NAMENDA ) 10 MG tablet TAKE 1 TABLET BY MOUTH TWICE A DAY 180 tablet 3   Omega-3 Fatty Acids (FISH OIL TRIPLE STRENGTH) 1400 MG CAPS Take 1,400 mg by mouth daily.     simvastatin  (ZOCOR ) 5 MG tablet TAKE 1 TABLET (5 MG TOTAL) BY MOUTH DAILY. 90 tablet 3   No current facility-administered medications on file prior to visit.    BP 130/80   Pulse (!) 104   Temp 98.6 F (37 C) (Oral)   Ht 5\' 7"  (1.702 m)   Wt 200 lb (90.7 kg)   SpO2 96%   BMI 31.32 kg/m       Objective:   Physical Exam Vitals and nursing note reviewed.  Cardiovascular:     Rate and Rhythm: Normal rate and regular rhythm.     Pulses: Normal pulses.     Heart sounds: Normal heart sounds.  Skin:    General: Skin is warm and dry.     Findings: Bruising present.       Neurological:     General: No focal deficit present.     Mental Status: He is oriented to person, place, and time.  Psychiatric:        Mood and Affect: Mood normal.        Behavior: Behavior normal.        Thought Content: Thought content normal.        Judgment: Judgment normal.       Assessment & Plan:  1. Bruising (Primary) - Bruising does appear to be healing  well. No issues with gait. I am not concerned  for rupture of muscle or tendon/ligament.  - Follow up if pain gets worse  2. Gait instability  - Ambulatory referral to Physical Therapy   Time spent with patient today was 30 minutes which consisted of chart review, discussing bruising and gait instability, work up, treatment, listening,  answering questions and documentation.

## 2024-04-09 ENCOUNTER — Other Ambulatory Visit: Payer: Self-pay | Admitting: Adult Health

## 2024-04-09 DIAGNOSIS — Z0000 Encounter for general adult medical examination without abnormal findings: Secondary | ICD-10-CM

## 2024-04-09 DIAGNOSIS — G3184 Mild cognitive impairment, so stated: Secondary | ICD-10-CM

## 2024-04-14 ENCOUNTER — Ambulatory Visit: Admitting: Rehabilitative and Restorative Service Providers"

## 2024-04-19 ENCOUNTER — Ambulatory Visit: Admitting: Rehabilitative and Restorative Service Providers"

## 2024-04-19 NOTE — Therapy (Incomplete)
 OUTPATIENT PHYSICAL THERAPY LOWER EXTREMITY EVALUATION   Patient Name: Paul Schwartz MRN: [id number] DOB:[birth year], 83 y.o., male Today's Date: 04/19/2024  END OF SESSION:   Past Medical History:  Diagnosis Date   Arthritis    Bilateral shoulder pain    BPH (benign prostatic hyperplasia)    GERD (gastroesophageal reflux disease)    History of kidney stones    Past Surgical History:  Procedure Laterality Date   achilles tendon Right    surgical repair bilat   CATARACT EXTRACTION, BILATERAL     COLONOSCOPY     REVERSE SHOULDER ARTHROPLASTY Right 08/19/2018   Procedure: REVERSE RIGHT SHOULDER ARTHROPLASTY;  Surgeon: Ellard Gunning, MD;  Location: [facility];  Service: Orthopedics;  Laterality: Right;    REVERSE SHOULDER ARTHROPLASTY Left 04/10/2022   Procedure: REVERSE SHOULDER ARTHROPLASTY;  Surgeon: Ellard Gunning, MD;  Location: [facility];  Service: Orthopedics;  Laterality: Left;    Patient Active Problem List   Diagnosis Date Noted   S/P reverse total shoulder arthroplasty, 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  Rationale for Evaluation and Treatment: Rehabilitation  ONSET DATE: ***  SUBJECTIVE:   SUBJECTIVE STATEMENT: ***  PERTINENT HISTORY: Mild cognitive impairment  PAIN:  Are you having pain? Yes: NPRS scale: *** Pain location: *** Pain description: *** Aggravating factors: *** Relieving factors: ***  PRECAUTIONS:  {Therapy precautions:24002}  RED FLAGS: None   WEIGHT BEARING RESTRICTIONS: No  FALLS:  Has patient fallen in last 6 months? {fallsyesno:27318}  LIVING ENVIRONMENT: Lives with: {[facility] lives with:25569::"lives with their family"} Lives in: {Lives in:[address]70} Stairs: {opstairs:27293} Has following equipment at home: {Assistive devices:23999}  OCCUPATION: ***  PLOF: {PLOF:24004}  PATIENT GOALS: ***  NEXT MD VISIT: as needed  OBJECTIVE:  Note: Objective measures were completed at Evaluation unless otherwise noted.  DIAGNOSTIC FINDINGS: N/A  PATIENT SURVEYS:  {rehab surveys:24030}  COGNITION: Overall cognitive status: {cognition:24006}     SENSATION: {sensation:27233}  EDEMA:  {edema:24020}  MUSCLE LENGTH: Hamstrings: Right *** deg; Left *** deg Thomas test: Right *** deg; Left *** deg  POSTURE: {posture:25561}  PALPATION: ***  LOWER EXTREMITY ROM:  Eval: ***  LOWER EXTREMITY MMT:  Eval: ***  LOWER EXTREMITY SPECIAL TESTS:  {LEspecialtests:26242}  FUNCTIONAL TESTS:  Eval: 5 times sit to stand: ***  GAIT: Distance walked: *** Assistive device utilized: {Assistive devices:23999} Level of assistance: {Levels of assistance:24026} Comments: ***  TODAY'S TREATMENT DATE: 04/19/2024 Issued and reviewed HEP    PATIENT EDUCATION:  Education details: Issued HEP Person educated: Patient Education method: Programmer, multimedia, Facilities manager, and Handouts Education comprehension: verbalized understanding and returned demonstration  HOME EXERCISE PROGRAM: ***  ASSESSMENT:  CLINICAL IMPRESSION: Patient is a 83 y.o. male who was seen today for physical therapy evaluation and treatment for instability of gait. ***  OBJECTIVE IMPAIRMENTS: {opptimpairments:25111}.   ACTIVITY LIMITATIONS: {activitylimitations:27494}  PARTICIPATION  LIMITATIONS: {participationrestrictions:25113}  PERSONAL FACTORS: {Personal factors:25162} are also affecting patient's functional outcome.   REHAB POTENTIAL: {rehabpotential:25112}  CLINICAL DECISION MAKING: {clinical decision making:25114}  EVALUATION COMPLEXITY: {Evaluation complexity:25115}   GOALS: Goals reviewed with patient? No  SHORT TERM GOALS: Target date: 05/13/2024 Patient will be independent with initial HEP. Baseline: Goal status: INITIAL  2.  *** Baseline:  Goal status: INITIAL  3.  *** Baseline:  Goal status: INITIAL  4.  *** Baseline:  Goal status: INITIAL  5.  *** Baseline:  Goal status: INITIAL  6.  *** Baseline:  Goal status: INITIAL  LONG TERM GOALS: Target date: 06/10/2024  Patient will be independent with advanced HEP to allow for self progression after discharge. Baseline:  Goal status: INITIAL  2.  *** Baseline:  Goal status: INITIAL  3.  *** Baseline:  Goal status: INITIAL  4.  *** Baseline:  Goal status: INITIAL  5.  *** Baseline:  Goal status: INITIAL  6.  *** Baseline:  Goal status: INITIAL   PLAN:  PT FREQUENCY: 2x/week  PT DURATION: 8 weeks  PLANNED INTERVENTIONS: 97164- PT Re-evaluation, 97750- Physical Performance Testing, 97110-Therapeutic exercises, 97530- Therapeutic activity, 97112- Neuromuscular re-education, 97535- Self Care, 16109- Manual therapy, [id number]- Gait training, [phone]- Canalith repositioning, [id number]- Aquatic Therapy, U9811- Electrical stimulation (unattended), [id number]- Electrical stimulation (manual), [id number]- Ultrasound, [id number]- Traction (mechanical), [id number]- Ionotophoresis 4mg /ml Dexamethasone , 91478 (1-2 muscles), 20561 (3+ muscles)- Dry Needling, Patient/Family education, Balance training, Stair training, Taping, Joint mobilization, Joint manipulation, Spinal manipulation, Spinal mobilization, Vestibular training, Cryotherapy, and Moist heat  PLAN FOR NEXT SESSION: Assess and progress HEP as indicated,  strengthening, flexibility, manual/dry needling as indicated    Robyne Christen, PT, DPT 04/19/24, 10:55 AM  [facility] [address], [address] [place], [place] [address] Phone # [phone] Fax [phone]

## 2024-04-27 ENCOUNTER — Other Ambulatory Visit: Payer: Self-pay | Admitting: Adult Health

## 2024-05-11 ENCOUNTER — Ambulatory Visit

## 2024-05-11 NOTE — Therapy (Deleted)
 OUTPATIENT PHYSICAL THERAPY LOWER EXTREMITY EVALUATION   Patient Name: Paul Schwartz MRN: [id number] DOB:[birth year], 83 y.o., male Today's Date: 05/11/2024  END OF SESSION:   Past Medical History:  Diagnosis Date   Arthritis    Bilateral shoulder pain    BPH (benign prostatic hyperplasia)    GERD (gastroesophageal reflux disease)    History of kidney stones    Past Surgical History:  Procedure Laterality Date   achilles tendon Right    surgical repair bilat   CATARACT EXTRACTION, BILATERAL     COLONOSCOPY     REVERSE SHOULDER ARTHROPLASTY Right 08/19/2018   Procedure: REVERSE RIGHT SHOULDER ARTHROPLASTY;  Surgeon: Melita Drivers, MD;  Location: [facility];  Service: Orthopedics;  Laterality: Right;    REVERSE SHOULDER ARTHROPLASTY Left 04/10/2022   Procedure: REVERSE SHOULDER ARTHROPLASTY;  Surgeon: Melita Drivers, MD;  Location: [facility];  Service: Orthopedics;  Laterality: Left;    Patient Active Problem List   Diagnosis Date Noted   S/P reverse total shoulder arthroplasty, 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  Rationale for Evaluation and Treatment: Rehabilitation  ONSET DATE: ***  SUBJECTIVE:   SUBJECTIVE STATEMENT:    From MD note on 03/17/24:  He reports that he four days ago he was outside in his yard and walked by a bird  and the bird  dive bombed me like a Ship broker.  He fell backwards into the grass. He did not hit  his head. He had a large bruise on the back of his left leg. Pain is getting better and is ambulatory without pain. He does feel unsteady with gait often.        PERTINENT HISTORY: Mild cognitive impairment  PAIN:  Are you having pain? Yes: NPRS scale: *** Pain location: *** Pain description: *** Aggravating factors: *** Relieving factors: ***  PRECAUTIONS: {Therapy precautions:24002}  RED FLAGS: None   WEIGHT BEARING RESTRICTIONS: No  FALLS:  Has patient fallen in last 6 months? {fallsyesno:27318}  LIVING ENVIRONMENT: Lives with: {[facility] lives with:25569::lives with their family} Lives in: {Lives in:[address]} Stairs: {opstairs:27293} Has following equipment at home: {Assistive devices:23999}  OCCUPATION: ***  PLOF: {PLOF:24004}  PATIENT GOALS: ***  NEXT MD VISIT: as needed  OBJECTIVE:  Note: Objective measures were completed at Evaluation unless otherwise noted.  DIAGNOSTIC FINDINGS: N/A    COGNITION: Overall cognitive status: {cognition:24006}     SENSATION: {sensation:27233}  EDEMA:  {edema:24020}  MUSCLE LENGTH: Hamstrings: Right *** deg; Left *** deg Thomas test: Right *** deg; Left *** deg  POSTURE: {posture:25561}  PALPATION: ***  LOWER EXTREMITY ROM:  Eval: ***  LOWER EXTREMITY MMT:  Eval: ***  LOWER EXTREMITY SPECIAL TESTS:  {LEspecialtests:26242}  FUNCTIONAL TESTS:  Eval: 5 times sit to stand: ***  GAIT: Distance walked: *** Assistive device utilized: {Assistive devices:23999} Level of assistance: {Levels of assistance:24026} Comments: ***  TODAY'S TREATMENT DATE: 04/19/2024 Issued and reviewed HEP    PATIENT EDUCATION:  Education details: Issued HEP Person educated: Patient Education method: Programmer, multimedia, Facilities manager, and Handouts Education comprehension: verbalized understanding and  returned demonstration  HOME EXERCISE PROGRAM: ***  ASSESSMENT:  CLINICAL IMPRESSION: Patient is a 83 y.o. male who was seen today for physical therapy evaluation and treatment for instability of gait. ***  OBJECTIVE IMPAIRMENTS: {opptimpairments:25111}.   ACTIVITY LIMITATIONS: {activitylimitations:27494}  PARTICIPATION LIMITATIONS: {participationrestrictions:25113}  PERSONAL FACTORS: {Personal factors:25162} are also affecting patient's functional outcome.   REHAB POTENTIAL: {rehabpotential:25112}  CLINICAL DECISION MAKING: {clinical decision making:25114}  EVALUATION COMPLEXITY: {Evaluation complexity:25115}   GOALS: Goals reviewed with patient? No  SHORT TERM GOALS: Target date: 05/13/2024 Patient will be independent with initial HEP. Baseline: Goal status: INITIAL  2.  *** Baseline:  Goal status: INITIAL  3.  *** Baseline:  Goal status: INITIAL  4.  *** Baseline:  Goal status: INITIAL  5.  *** Baseline:  Goal status: INITIAL  6.  *** Baseline:  Goal status: INITIAL  LONG TERM GOALS: Target date: 06/10/2024  Patient will be independent with advanced HEP to allow for self progression after discharge. Baseline:  Goal status: INITIAL  2.  *** Baseline:  Goal status: INITIAL  3.  *** Baseline:  Goal status: INITIAL  4.  *** Baseline:  Goal status: INITIAL  5.  *** Baseline:  Goal status: INITIAL  6.  *** Baseline:  Goal status: INITIAL   PLAN:  PT FREQUENCY: 2x/week  PT DURATION: 8 weeks  PLANNED INTERVENTIONS: 97164- PT Re-evaluation, 97750- Physical Performance Testing, 97110-Therapeutic exercises, 97530- Therapeutic activity, 97112- Neuromuscular re-education, 97535- Self Care, 02859- Manual therapy, [id number]- Gait training, [phone]- Canalith repositioning, [id number]- Aquatic Therapy, H9716- Electrical stimulation (unattended), [id number]- Electrical stimulation (manual), [id number]- Ultrasound, [id number]- Traction (mechanical), [id number]- Ionotophoresis  4mg /ml Dexamethasone , 79439 (1-2 muscles), 20561 (3+ muscles)- Dry Needling, Patient/Family education, Balance training, Stair training, Taping, Joint mobilization, Joint manipulation, Spinal manipulation, Spinal mobilization, Vestibular training, Cryotherapy, and Moist heat  PLAN FOR NEXT SESSION: Assess and progress HEP as indicated, work on balance, gait and endurance  Burnard Joy, PT 05/11/24 7:03 AM   [facility] [address] [place], [place] [address] Phone # [phone] Fax [phone]

## 2024-05-18 ENCOUNTER — Other Ambulatory Visit: Payer: Self-pay | Admitting: Adult Health

## 2024-05-18 DIAGNOSIS — F32A Depression, unspecified: Secondary | ICD-10-CM

## 2024-05-25 ENCOUNTER — Ambulatory Visit

## 2024-05-25 ENCOUNTER — Ambulatory Visit: Admitting: Physical Therapy

## 2024-05-25 NOTE — Therapy (Incomplete)
 OUTPATIENT PHYSICAL THERAPY THORACOLUMBAR EVALUATION   Patient Name: Paul Schwartz MRN: [id number] DOB:[birth year], 83 y.o., male Today's Date: 05/25/2024  END OF SESSION:   Past Medical History:  Diagnosis Date   Arthritis    Bilateral shoulder pain    BPH (benign prostatic hyperplasia)    GERD (gastroesophageal reflux disease)    History of kidney stones    Past Surgical History:  Procedure Laterality Date   achilles tendon Right    surgical repair bilat   CATARACT EXTRACTION, BILATERAL     COLONOSCOPY     REVERSE SHOULDER ARTHROPLASTY Right 08/19/2018   Procedure: REVERSE RIGHT SHOULDER ARTHROPLASTY;  Surgeon: Melita Drivers, MD;  Location: [facility];  Service: Orthopedics;  Laterality: Right;    REVERSE SHOULDER ARTHROPLASTY Left 04/10/2022   Procedure: REVERSE SHOULDER ARTHROPLASTY;  Surgeon: Melita Drivers, MD;  Location: [facility];  Service: Orthopedics;  Laterality: Left;    Patient Active Problem List   Diagnosis Date Noted   S/P reverse total shoulder arthroplasty, 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  ONSET DATE: ***  SUBJECTIVE:                                                                                                                                                                                           SUBJECTIVE STATEMENT: ***  PERTINENT HISTORY:  History of  bil reverse shoulder arthroplasty  PAIN:    PRECAUTIONS: Fall  RED FLAGS: None   WEIGHT BEARING RESTRICTIONS: No  FALLS:  Has patient fallen in last 6 months? {fallsyesno:27318}  LIVING ENVIRONMENT: Lives with: {OP[facility] lives with:25569::lives with their family} Lives in: {Lives in:25570} Stairs: {opstairs:27293} Has following equipment at home: {Assistive devices:23999}  OCCUPATION: ***  PLOF: {PLOF:24004}  PATIENT GOALS: ***  NEXT MD VISIT: ***  OBJECTIVE:  Note: Objective measures were completed at Evaluation unless otherwise noted.  DIAGNOSTIC FINDINGS:  ***  PATIENT SURVEYS:  ABC scale: The Activities-Specific Balance Confidence (ABC) Scale 0% 10 20 30  40 50 60 70 80 90 100% No confidence<->completely confident  "How confident are you that you  will not lose your balance or become unsteady when you . . .   Date tested ***  Walk around the house ***%  2. Walk up or down stairs ***%  3. Bend over and pick up a slipper from in front of a closet floor ***%  4. Reach for a small can off a shelf at eye level ***%  5. Stand on tip toes and reach for something above your head ***%  6. Stand on a chair and reach for something ***%  7. Sweep the floor ***%  8. Walk outside the house to a car parked in the driveway ***%  9. Get into or out of a car ***%  10. Walk across a parking lot to the mall ***%  11. Walk up or down a ramp ***%  12. Walk in a crowded mall where people rapidly walk past you ***%  13. Are bumped into by people as you walk through the mall ***%  14. Step onto or off of an escalator while you are holding onto the railing ***%  15. Step onto or off an escalator while holding onto parcels such that you cannot hold onto the railing ***%  16. Walk outside on icy sidewalks ***%  Total: #/16 ***     COGNITION: Overall cognitive status: {cognition:24006}     SENSATION: {sensation:27233}  MUSCLE LENGTH:   POSTURE:  {posture:25561}  PALPATION: ***  LUMBAR ROM:   AROM eval  Flexion   Extension   Right lateral flexion   Left lateral flexion   Right rotation   Left rotation    (Blank rows = not tested)  LOWER EXTREMITY ROM:     {AROM/PROM:27142}  Right eval Left eval  Hip flexion    Hip extension    Hip abduction    Hip adduction    Hip internal rotation    Hip external rotation    Knee flexion    Knee extension    Ankle dorsiflexion    Ankle plantarflexion    Ankle inversion    Ankle eversion     (Blank rows = not tested)  LOWER EXTREMITY MMT:    MMT Right eval Left eval  Hip flexion    Hip extension    Hip abduction    Hip adduction    Hip internal rotation    Hip external rotation    Knee flexion    Knee extension    Ankle dorsiflexion    Ankle plantarflexion    Ankle inversion    Ankle eversion     (Blank rows = not tested)  LUMBAR SPECIAL TESTS:  {lumbar special test:25242}  FUNCTIONAL TESTS:  {Functional tests:24029}  GAIT: Distance walked: *** Assistive device utilized: {Assistive devices:23999} Level of assistance: {Levels of assistance:24026} Comments: ***  TREATMENT DATE:  05/25/24  PATIENT EDUCATION:  Education details: *** Person educated: {Person educated:25204} Education method: {Education Method:25205} Education comprehension: {Education Comprehension:25206}  HOME EXERCISE PROGRAM: ***  ASSESSMENT:  CLINICAL IMPRESSION: Patient is a 83 y.o. male who was seen today for physical therapy evaluation and treatment for ***.   OBJECTIVE IMPAIRMENTS: {opptimpairments:25111}.   ACTIVITY LIMITATIONS: {activitylimitations:27494}  PARTICIPATION LIMITATIONS: {participationrestrictions:25113}  PERSONAL FACTORS: {Personal factors:25162} are also affecting patient's functional outcome.   REHAB POTENTIAL:  {rehabpotential:25112}  CLINICAL DECISION MAKING: {clinical decision making:25114}  EVALUATION COMPLEXITY: {Evaluation complexity:25115}   GOALS: Goals reviewed with patient? Yes  SHORT TERM GOALS: Target date: 06/25/24  *** Baseline: Goal status: INITIAL  2.  *** Baseline:  Goal status: INITIAL  3.  *** Baseline:  Goal status: INITIAL  4.  *** Baseline:  Goal status: INITIAL  5.  *** Baseline:  Goal status: INITIAL  6.  *** Baseline:  Goal status: INITIAL  LONG TERM GOALS: Target date: 07/20/24  *** Baseline:  Goal status: INITIAL  2.  *** Baseline:  Goal status: INITIAL  3.  *** Baseline:  Goal status: INITIAL  4.  *** Baseline:  Goal status: INITIAL  5.  *** Baseline:  Goal status: INITIAL  6.  *** Baseline:  Goal status: INITIAL  PLAN:  PT FREQUENCY: {rehab frequency:25116}  PT DURATION: {rehab duration:25117}  PLANNED INTERVENTIONS: {rehab planned interventions:25118::97110-Therapeutic exercises,97530- Therapeutic [phone]- Neuromuscular re-education,97535- Self Rjmz,02859- Manual therapy}.  PLAN FOR NEXT SESSION: ***   Iasiah Ozment E Ocia Simek, PT 05/25/2024, 1:50 PM

## 2024-05-26 ENCOUNTER — Encounter: Payer: Self-pay | Admitting: Adult Health

## 2024-05-26 ENCOUNTER — Ambulatory Visit: Admitting: Adult Health

## 2024-05-26 VITALS — BP 120/80 | HR 101 | Temp 98.0°F | Ht 67.0 in | Wt 190.0 lb

## 2024-05-26 DIAGNOSIS — R058 Other specified cough: Secondary | ICD-10-CM | POA: Diagnosis not present

## 2024-05-26 DIAGNOSIS — R2681 Unsteadiness on feet: Secondary | ICD-10-CM

## 2024-05-26 MED ORDER — PREDNISONE 20 MG PO TABS: 20.0000 mg | ORAL_TABLET | Freq: Every day | ORAL | 0 refills | Status: DC

## 2024-05-26 MED ORDER — DOXYCYCLINE HYCLATE 100 MG PO CAPS: 100.0000 mg | ORAL_CAPSULE | Freq: Two times a day (BID) | ORAL | 0 refills | Status: DC

## 2024-05-26 MED ORDER — BENZONATATE 200 MG PO CAPS: 200.0000 mg | ORAL_CAPSULE | Freq: Three times a day (TID) | ORAL | 0 refills | Status: DC | PRN

## 2024-05-26 NOTE — Progress Notes (Signed)
 Subjective:    Patient ID: Paul Schwartz, male    DOB: 01-Jul-1941, 83 y.o.   MRN: [id number]  Cough    83 year old male who  has a past medical history of Arthritis, Bilateral shoulder pain, BPH (benign prostatic hyperplasia), GERD (gastroesophageal reflux disease), and History of kidney stones.  He presents to the office today for an acute issue. He reports having a productive cough with white sputum x 3-4 days. He has not had any fevers or chills. He denies sinus pain or pressure.   Back in May 2025 we referred to PT for gait instability but he never went. He continues to have issues with gait and would like to be referred back.   Review of Systems  Respiratory:  Positive for cough.    See HPI   Past Medical History:  Diagnosis Date   Arthritis    Bilateral shoulder pain    BPH (benign prostatic hyperplasia)    GERD (gastroesophageal reflux disease)    History of kidney stones     Social History   Socioeconomic History   Marital status: Married    Spouse name: Not on file   Number of children: Not on file   Years of education: Not on file   Highest education level: Not on file  Occupational History   Not on file  Tobacco Use   Smoking status: Former    Current packs/day: 0.00    Average packs/day: 1 pack/day for 10.0 years (10.0 ttl pk-yrs)    Types: Cigarettes    Start date: 04/07/1958    Quit date: 04/07/1968    Years since quitting: 56.1   Smokeless tobacco: Never  Vaping Use   Vaping status: Never Used  Substance and Sexual Activity   Alcohol use: Yes    Alcohol/week: 4.0 standard drinks of alcohol    Types: 4 Glasses of wine per week    Comment: weekends   Drug use: No   Sexual activity: Yes  Other Topics Concern   Not on file  Social History Narrative   Retired    Married - 50 years    One son that lives in [place]    2 grandchildren    Social Drivers of Health   Financial Resource Strain: Low Risk  (03/14/2022)   Overall Financial  Resource Strain (CARDIA)    Difficulty of Paying Living Expenses: Not hard at all  Food Insecurity: No Food Insecurity (03/14/2022)   Hunger Vital Sign    Worried About Running Out of Food in the Last Year: Never true    Ran Out of Food in the Last Year: Never true  Transportation Needs: No Transportation Needs (03/14/2022)   PRAPARE - Administrator, Civil Service (Medical): No    Lack of Transportation (Non-Medical): No  Physical Activity: Insufficiently Active (03/14/2022)   Exercise Vital Sign    Days of Exercise per Week: 2 days    Minutes of Exercise per Session: 60 min  Stress: No Stress Concern Present (03/14/2022)   [employer] of Occupational Health - Occupational Stress Questionnaire    Feeling of Stress : Not at all  Social Connections: Socially Integrated (03/14/2022)   Social Connection and Isolation Panel    Frequency of Communication with Friends and Family: More than three times a week    Frequency of Social Gatherings with Friends and Family: More than three times a week    Attends Religious Services: More than 4 times per year  Active Member of Clubs or Organizations: Yes    Attends Banker Meetings: More than 4 times per year    Marital Status: Married  Catering manager Violence: Not At Risk (03/14/2022)   Humiliation, Afraid, Rape, and Kick questionnaire    Fear of Current or Ex-Partner: No    Emotionally Abused: No    Physically Abused: No    Sexually Abused: No    Past Surgical History:  Procedure Laterality Date   achilles tendon Right    surgical repair bilat   CATARACT EXTRACTION, BILATERAL     COLONOSCOPY     REVERSE SHOULDER ARTHROPLASTY Right 08/19/2018   Procedure: REVERSE RIGHT SHOULDER ARTHROPLASTY;  Surgeon: Melita Drivers, MD;  Location: [facility];  Service: Orthopedics;  Laterality: Right;    REVERSE SHOULDER ARTHROPLASTY Left 04/10/2022   Procedure: REVERSE SHOULDER ARTHROPLASTY;  Surgeon: Melita Drivers, MD;  Location:  [facility];  Service: Orthopedics;  Laterality: Left;     Family History  Problem Relation Age of Onset   Heart disease Father    Colon cancer Neg Hx    Esophageal cancer Neg Hx    Stomach cancer Neg Hx    Rectal cancer Neg Hx     Allergies  Allergen Reactions   Plasticized Base [Plastibase] Hives and Rash    Current Outpatient Medications on File Prior to Visit  Medication Sig Dispense Refill   buPROPion  (WELLBUTRIN  XL) 150 MG 24 hr tablet TAKE 1 TABLET BY MOUTH EVERY DAY 90 tablet 1   citalopram  (CELEXA ) 20 MG tablet Take 1 tablet (20 mg total) by mouth daily. 90 tablet 1   diclofenac  Sodium (VOLTAREN ) 1 % GEL Apply 1 application. topically 4 (four) times daily as needed (pain).     donepezil  (ARICEPT ) 10 MG tablet TAKE 1 TABLET BY MOUTH EVERYDAY AT BEDTIME 90 tablet 3   memantine  (NAMENDA ) 10 MG tablet TAKE 1 TABLET BY MOUTH TWICE A DAY 180 tablet 3   Omega-3 Fatty Acids (FISH OIL TRIPLE STRENGTH) 1400 MG CAPS Take 1,400 mg by mouth daily.     simvastatin  (ZOCOR ) 5 MG tablet TAKE 1 TABLET (5 MG TOTAL) BY MOUTH DAILY. 90 tablet 3   No current facility-administered medications on file prior to visit.    BP 120/80   Pulse (!) 101   Temp 98 F (36.7 C) (Oral)   Ht 5' 7 (1.702 m)   Wt 190 lb (86.2 kg)   SpO2 95%   BMI 29.76 kg/m       Objective:   Physical Exam Vitals and nursing note reviewed.  Constitutional:      Appearance: Normal appearance.  Cardiovascular:     Rate and Rhythm: Normal rate and regular rhythm.     Pulses: Normal pulses.     Heart sounds: Normal heart sounds.  Pulmonary:     Effort: Pulmonary effort is normal.     Breath sounds: Normal breath sounds. No decreased breath sounds, rhonchi or rales.  Musculoskeletal:        General: Normal range of motion.  Skin:    General: Skin is warm and dry.  Neurological:     General: No focal deficit present.     Mental Status: He is oriented to person, place, and time.  Psychiatric:        Mood  and Affect: Mood normal.        Behavior: Behavior normal.        Thought Content: Thought content normal.  Judgment: Judgment normal.       Assessment & Plan:  1. Productive cough (Primary) - Will treat with doxycycline  and prednisone . Will do chest xray today due to concern for PNA.  - Follow up if not improving/resolved by the end of abx therapy.  - doxycycline  (VIBRAMYCIN ) 100 MG capsule; Take 1 capsule (100 mg total) by mouth 2 (two) times daily.  Dispense: 14 capsule; Refill: 0 - predniSONE  (DELTASONE ) 20 MG tablet; Take 1 tablet (20 mg total) by mouth daily with breakfast.  Dispense: 7 tablet; Refill: 0 - DG Chest 2 View; Future - benzonatate  (TESSALON ) 200 MG capsule; Take 1 capsule (200 mg total) by mouth 3 (three) times daily as needed.  Dispense: 30 capsule; Refill: 0  2. Gait instability  - Ambulatory referral to Physical Therapy  Benaiah Behan, NP

## 2024-05-27 ENCOUNTER — Ambulatory Visit: Payer: Self-pay

## 2024-05-27 NOTE — Telephone Encounter (Signed)
 RN attempted to contact wife regarding patient's symptoms. No answer.                              Message from Beverly C sent at 05/27/2024 11:22 AM EDT  patient wife called in stated he was precribed medication but he still can not stop coughing , wanted to know if he needs to be given more medication would like a callback

## 2024-05-27 NOTE — Telephone Encounter (Signed)
 Second attempt to contact pt/family, no answer, LVM for call back to PCP office to discuss symptoms.

## 2024-05-27 NOTE — Telephone Encounter (Signed)
 Third attempt to contact patient regarding continuous cough, will route to office for follow-up.

## 2024-05-30 ENCOUNTER — Ambulatory Visit: Payer: Self-pay

## 2024-05-30 NOTE — Telephone Encounter (Signed)
  FYI Only or Action Required?: Action required by provider: wants more medication called in.  Patient was last seen in primary care on 05/26/2024 by Merna Huxley, NP.  Called Nurse Triage reporting Cough.  Symptoms began a week ago.  Interventions attempted: Nothing.  Symptoms are: unchanged.  Triage Disposition: See PCP When Office is Open (Within 3 Days)  Patient/caregiver understands and will follow disposition?: No, wishes to speak with PCP     This RN made first attempt to triage patient. No answer, LVM. Routing for additional attempts.   Copied from CRM [id number]. Topic: Clinical - Medical Advice >> May 30, 2024  9:42 AM Gustabo D wrote: Patient still has a bad cough and is out of the medication and would like to know what to do Reason for Disposition  Cough has been present for > 3 weeks  Answer Assessment - Initial Assessment Questions 1. ONSET: When did the cough begin?      A week ago 2. SEVERITY: How bad is the cough today?      Did not answer 3. SPUTUM: Describe the color of your sputum (e.g., none, dry cough; clear, white, yellow, green)     no 4. HEMOPTYSIS: Are you coughing up any blood? If Yes, ask: How much? (e.g., flecks, streaks, tablespoons, etc.)     no 5. DIFFICULTY BREATHING: Are you having difficulty breathing? If Yes, ask: How bad is it? (e.g., mild, moderate, severe)      no 6. FEVER: Do you have a fever? If Yes, ask: What is your temperature, how was it measured, and when did it start?     no 7. CARDIAC HISTORY: Do you have any history of heart disease? (e.g., heart attack, congestive heart failure)      no 8. LUNG HISTORY: Do you have any history of lung disease?  (e.g., pulmonary embolus, asthma, emphysema)     no 9. PE RISK FACTORS: Do you have a history of blood clots? (or: recent major surgery, recent prolonged travel, bedridden)     no 10. OTHER SYMPTOMS: Do you have any other symptoms? (e.g., runny nose,  wheezing, chest pain)       no 11. PREGNANCY: Is there any chance you are pregnant? When was your last menstrual period?       na 12. TRAVEL: Have you traveled out of the country in the last month? (e.g., travel history, exposures)       no  Protocols used: Cough - Acute Productive-A-AH

## 2024-05-30 NOTE — Telephone Encounter (Signed)
 This RN made first attempt to triage patient. No answer, LVM. Routing for additional attempts.   Copied from CRM [id number]. Topic: Clinical - Medical Advice >> May 30, 2024  9:42 AM Paul Schwartz wrote: Patient still has a bad cough and is out of the medication and would like to know what to do

## 2024-05-30 NOTE — Telephone Encounter (Signed)
 This RN attempted 2nd attempt to patient. LVM.

## 2024-05-31 ENCOUNTER — Ambulatory Visit: Admitting: Family Medicine

## 2024-05-31 ENCOUNTER — Other Ambulatory Visit: Payer: Self-pay | Admitting: Adult Health

## 2024-05-31 NOTE — Telephone Encounter (Signed)
 Patient has scheduled an appointment with Dr Johnny 05/31/24.

## 2024-05-31 NOTE — Telephone Encounter (Signed)
 Left message on machine for patient to return our call

## 2024-06-03 ENCOUNTER — Ambulatory Visit: Admitting: Adult Health

## 2024-06-03 NOTE — Progress Notes (Deleted)
 Subjective:    Patient ID: Paul Schwartz, male    DOB: [birth year], 83 y.o.   MRN: [id number]  HPI 83 year old male who  has a past medical history of Arthritis, Bilateral shoulder pain, BPH (benign prostatic hyperplasia), GERD (gastroesophageal reflux disease), and History of kidney stones.  He presents to the office today for follow-up, he was seen about a week ago for an acute issue of a productive cough with white sputum and x 3 to 4 days.  At this time he denied any fevers, chills, sinus pain, pressure, or feeling acutely ill.  Chest x-ray was ordered but he left before this was done, he was started on doxycycline , prednisone  20 mg x 7 days and was given Tessalon  Perles.  Despite these measures his cough has continued.   Review of Systems See HPI   Past Medical History:  Diagnosis Date   Arthritis    Bilateral shoulder pain    BPH (benign prostatic hyperplasia)    GERD (gastroesophageal reflux disease)    History of kidney stones     Social History   Socioeconomic History   Marital status: Married    Spouse name: Not on file   Number of children: Not on file   Years of education: Not on file   Highest education level: Not on file  Occupational History   Not on file  Tobacco Use   Smoking status: Former    Current packs/day: 0.00    Average packs/day: 1 pack/day for 10.0 years (10.0 ttl pk-yrs)    Types: Cigarettes    Start date: 04/07/1958    Quit date: 04/07/1968    Years since quitting: 56.1   Smokeless tobacco: Never  Vaping Use   Vaping status: Never Used  Substance and Sexual Activity   Alcohol use: Yes    Alcohol/week: 4.0 standard drinks of alcohol    Types: 4 Glasses of wine per week    Comment: weekends   Drug use: No   Sexual activity: Yes  Other Topics Concern   Not on file  Social History Narrative   Retired    Married - 50 years    One son that lives in [place]    2 grandchildren    Social Drivers of Health   Financial Resource Strain:  Low Risk  (03/14/2022)   Overall Financial Resource Strain (CARDIA)    Difficulty of Paying Living Expenses: Not hard at all  Food Insecurity: No Food Insecurity (03/14/2022)   Hunger Vital Sign    Worried About Running Out of Food in the Last Year: Never true    Ran Out of Food in the Last Year: Never true  Transportation Needs: No Transportation Needs (03/14/2022)   PRAPARE - Administrator, Civil Service (Medical): No    Lack of Transportation (Non-Medical): No  Physical Activity: Insufficiently Active (03/14/2022)   Exercise Vital Sign    Days of Exercise per Week: 2 days    Minutes of Exercise per Session: 60 min  Stress: No Stress Concern Present (03/14/2022)   [employer] of Occupational Health - Occupational Stress Questionnaire    Feeling of Stress : Not at all  Social Connections: Socially Integrated (03/14/2022)   Social Connection and Isolation Panel    Frequency of Communication with Friends and Family: More than three times a week    Frequency of Social Gatherings with Friends and Family: More than three times a week    Attends Religious Services: More  than 4 times per year    Active Member of Clubs or Organizations: Yes    Attends Banker Meetings: More than 4 times per year    Marital Status: Married  Catering manager Violence: Not At Risk (03/14/2022)   Humiliation, Afraid, Rape, and Kick questionnaire    Fear of Current or Ex-Partner: No    Emotionally Abused: No    Physically Abused: No    Sexually Abused: No    Past Surgical History:  Procedure Laterality Date   achilles tendon Right    surgical repair bilat   CATARACT EXTRACTION, BILATERAL     COLONOSCOPY     REVERSE SHOULDER ARTHROPLASTY Right 08/19/2018   Procedure: REVERSE RIGHT SHOULDER ARTHROPLASTY;  Surgeon: Melita Drivers, MD;  Location: [facility];  Service: Orthopedics;  Laterality: Right;    REVERSE SHOULDER ARTHROPLASTY Left 04/10/2022   Procedure: REVERSE SHOULDER  ARTHROPLASTY;  Surgeon: Melita Drivers, MD;  Location: [facility];  Service: Orthopedics;  Laterality: Left;     Family History  Problem Relation Age of Onset   Heart disease Father    Colon cancer Neg Hx    Esophageal cancer Neg Hx    Stomach cancer Neg Hx    Rectal cancer Neg Hx     Allergies  Allergen Reactions   Plasticized Base [Plastibase] Hives and Rash    Current Outpatient Medications on File Prior to Visit  Medication Sig Dispense Refill   benzonatate  (TESSALON ) 200 MG capsule Take 1 capsule (200 mg total) by mouth 3 (three) times daily as needed. 30 capsule 0   buPROPion  (WELLBUTRIN  XL) 150 MG 24 hr tablet TAKE 1 TABLET BY MOUTH EVERY DAY 90 tablet 1   citalopram  (CELEXA ) 20 MG tablet Take 1 tablet (20 mg total) by mouth daily. 90 tablet 1   diclofenac  Sodium (VOLTAREN ) 1 % GEL Apply 1 application. topically 4 (four) times daily as needed (pain).     donepezil  (ARICEPT ) 10 MG tablet TAKE 1 TABLET BY MOUTH EVERYDAY AT BEDTIME 90 tablet 3   doxycycline  (VIBRAMYCIN ) 100 MG capsule Take 1 capsule (100 mg total) by mouth 2 (two) times daily. 14 capsule 0   memantine  (NAMENDA ) 10 MG tablet TAKE 1 TABLET BY MOUTH TWICE A DAY 180 tablet 3   Omega-3 Fatty Acids (FISH OIL TRIPLE STRENGTH) 1400 MG CAPS Take 1,400 mg by mouth daily.     predniSONE  (DELTASONE ) 20 MG tablet Take 1 tablet (20 mg total) by mouth daily with breakfast. 7 tablet 0   simvastatin  (ZOCOR ) 5 MG tablet TAKE 1 TABLET (5 MG TOTAL) BY MOUTH DAILY. 90 tablet 3   No current facility-administered medications on file prior to visit.    There were no vitals taken for this visit.      Objective:   Physical Exam        Assessment & Plan:

## 2024-06-09 ENCOUNTER — Encounter: Payer: Self-pay | Admitting: Adult Health

## 2024-06-09 ENCOUNTER — Ambulatory Visit (INDEPENDENT_AMBULATORY_CARE_PROVIDER_SITE_OTHER): Admitting: Adult Health

## 2024-06-09 VITALS — BP 100/60 | HR 85 | Temp 98.4°F | Ht 67.0 in | Wt 190.0 lb

## 2024-06-09 DIAGNOSIS — R051 Acute cough: Secondary | ICD-10-CM

## 2024-06-09 MED ORDER — GUAIFENESIN-CODEINE 100-10 MG/5ML PO SOLN: 10.0000 mL | Freq: Four times a day (QID) | ORAL | 0 refills | Status: DC | PRN

## 2024-06-09 NOTE — Progress Notes (Signed)
 Subjective:    Patient ID: Paul Schwartz, male    DOB: 06-Mar-1941, 83 y.o.   MRN: [id number]  HPI  83 year old male who  has a past medical history of Arthritis, Bilateral shoulder pain, BPH (benign prostatic hyperplasia), GERD (gastroesophageal reflux disease), and History of kidney stones.   He presents to the office today for follow-up, he was seen about two weeks ago for an acute issue of a productive cough with white sputum and x 3 to 4 days.  At this time he denied any fevers, chills, sinus pain, pressure, or feeling acutely ill.  Chest x-ray was ordered but he left before this was done, he was started on doxycycline , prednisone  20 mg x 7 days and was given Tessalon  Perles.  Despite these measures his cough has continued.At home he has been using Delsym without improvement.   He continues to deny fevers, chills, or feeling acutely ill.   Review of Systems See HPI   Past Medical History:  Diagnosis Date   Arthritis    Bilateral shoulder pain    BPH (benign prostatic hyperplasia)    GERD (gastroesophageal reflux disease)    History of kidney stones     Social History   Socioeconomic History   Marital status: Married    Spouse name: Not on file   Number of children: Not on file   Years of education: Not on file   Highest education level: Not on file  Occupational History   Not on file  Tobacco Use   Smoking status: Former    Current packs/day: 0.00    Average packs/day: 1 pack/day for 10.0 years (10.0 ttl pk-yrs)    Types: Cigarettes    Start date: 04/07/1958    Quit date: 04/07/1968    Years since quitting: 56.2   Smokeless tobacco: Never  Vaping Use   Vaping status: Never Used  Substance and Sexual Activity   Alcohol use: Yes    Alcohol/week: 4.0 standard drinks of alcohol    Types: 4 Glasses of wine per week    Comment: weekends   Drug use: No   Sexual activity: Yes  Other Topics Concern   Not on file  Social History Narrative   Retired    Married -  50 years    One son that lives in [place]    2 grandchildren    Social Drivers of Health   Financial Resource Strain: Low Risk  (03/14/2022)   Overall Financial Resource Strain (CARDIA)    Difficulty of Paying Living Expenses: Not hard at all  Food Insecurity: No Food Insecurity (03/14/2022)   Hunger Vital Sign    Worried About Running Out of Food in the Last Year: Never true    Ran Out of Food in the Last Year: Never true  Transportation Needs: No Transportation Needs (03/14/2022)   PRAPARE - Administrator, Civil Service (Medical): No    Lack of Transportation (Non-Medical): No  Physical Activity: Insufficiently Active (03/14/2022)   Exercise Vital Sign    Days of Exercise per Week: 2 days    Minutes of Exercise per Session: 60 min  Stress: No Stress Concern Present (03/14/2022)   [employer] of Occupational Health - Occupational Stress Questionnaire    Feeling of Stress : Not at all  Social Connections: Socially Integrated (03/14/2022)   Social Connection and Isolation Panel    Frequency of Communication with Friends and Family: More than three times a week  Frequency of Social Gatherings with Friends and Family: More than three times a week    Attends Religious Services: More than 4 times per year    Active Member of [employer] or Organizations: Yes    Attends Banker Meetings: More than 4 times per year    Marital Status: Married  Catering manager Violence: Not At Risk (03/14/2022)   Humiliation, Afraid, Rape, and Kick questionnaire    Fear of Current or Ex-Partner: No    Emotionally Abused: No    Physically Abused: No    Sexually Abused: No    Past Surgical History:  Procedure Laterality Date   achilles tendon Right    surgical repair bilat   CATARACT EXTRACTION, BILATERAL     COLONOSCOPY     REVERSE SHOULDER ARTHROPLASTY Right 08/19/2018   Procedure: REVERSE RIGHT SHOULDER ARTHROPLASTY;  Surgeon: Melita Drivers, MD;  Location: [facility];  Service:  Orthopedics;  Laterality: Right;    REVERSE SHOULDER ARTHROPLASTY Left 04/10/2022   Procedure: REVERSE SHOULDER ARTHROPLASTY;  Surgeon: Melita Drivers, MD;  Location: [facility];  Service: Orthopedics;  Laterality: Left;     Family History  Problem Relation Age of Onset   Heart disease Father    Colon cancer Neg Hx    Esophageal cancer Neg Hx    Stomach cancer Neg Hx    Rectal cancer Neg Hx     Allergies  Allergen Reactions   Plasticized Base [Plastibase] Hives and Rash    Current Outpatient Medications on File Prior to Visit  Medication Sig Dispense Refill   benzonatate  (TESSALON ) 200 MG capsule Take 1 capsule (200 mg total) by mouth 3 (three) times daily as needed. 30 capsule 0   buPROPion  (WELLBUTRIN  XL) 150 MG 24 hr tablet TAKE 1 TABLET BY MOUTH EVERY DAY 90 tablet 1   citalopram  (CELEXA ) 20 MG tablet Take 1 tablet (20 mg total) by mouth daily. 90 tablet 1   diclofenac  Sodium (VOLTAREN ) 1 % GEL Apply 1 application. topically 4 (four) times daily as needed (pain).     donepezil  (ARICEPT ) 10 MG tablet TAKE 1 TABLET BY MOUTH EVERYDAY AT BEDTIME 90 tablet 3   doxycycline  (VIBRAMYCIN ) 100 MG capsule Take 1 capsule (100 mg total) by mouth 2 (two) times daily. 14 capsule 0   memantine  (NAMENDA ) 10 MG tablet TAKE 1 TABLET BY MOUTH TWICE A DAY 180 tablet 3   Omega-3 Fatty Acids (FISH OIL TRIPLE STRENGTH) 1400 MG CAPS Take 1,400 mg by mouth daily.     predniSONE  (DELTASONE ) 20 MG tablet Take 1 tablet (20 mg total) by mouth daily with breakfast. 7 tablet 0   simvastatin  (ZOCOR ) 5 MG tablet TAKE 1 TABLET (5 MG TOTAL) BY MOUTH DAILY. 90 tablet 3   No current facility-administered medications on file prior to visit.    BP 100/60   Pulse 85   Temp 98.4 F (36.9 C) (Oral)   Ht 5' 7 (1.702 m)   Wt 190 lb (86.2 kg)   SpO2 96%   BMI 29.76 kg/m       Objective:   Physical Exam Vitals and nursing note reviewed.  Constitutional:      Appearance: Normal appearance.  HENT:      Nose: Nose normal. No congestion or rhinorrhea.     Mouth/Throat:     Mouth: Mucous membranes are moist.     Pharynx: Oropharynx is clear.  Cardiovascular:     Rate and Rhythm: Normal rate and regular rhythm.  Pulses: Normal pulses.     Heart sounds: Normal heart sounds.  Pulmonary:     Effort: Pulmonary effort is normal.     Breath sounds: Normal breath sounds. No wheezing or rhonchi.  Musculoskeletal:        General: Normal range of motion.  Skin:    General: Skin is warm and dry.  Neurological:     General: No focal deficit present.     Mental Status: He is alert and oriented to person, place, and time.  Psychiatric:        Mood and Affect: Mood normal.        Behavior: Behavior normal.        Thought Content: Thought content normal.        Judgment: Judgment normal.        Assessment & Plan:  1. Acute cough (Primary) - We will get his xray done tomorrow in the office and I will send in cough syrup. He was advised that this may make him sleepy.  - guaiFENesin -codeine  100-10 MG/5ML syrup; Take 10 mLs by mouth every 6 (six) hours as needed for cough.  Dispense: 120 mL; Refill: 0   Darleene Shape, NP

## 2024-06-09 NOTE — Patient Instructions (Addendum)
 Please come back tomorrow at 8:40 for a chest xray   I have sent a cough suppressant for you - this medication may cause drowsiness.

## 2024-06-10 ENCOUNTER — Ambulatory Visit

## 2024-06-10 ENCOUNTER — Other Ambulatory Visit

## 2024-06-10 DIAGNOSIS — Z96611 Presence of right artificial shoulder joint: Secondary | ICD-10-CM | POA: Diagnosis not present

## 2024-06-10 DIAGNOSIS — Z96612 Presence of left artificial shoulder joint: Secondary | ICD-10-CM | POA: Diagnosis not present

## 2024-06-10 DIAGNOSIS — R058 Other specified cough: Secondary | ICD-10-CM | POA: Diagnosis not present

## 2024-06-14 ENCOUNTER — Encounter: Payer: Self-pay | Admitting: Adult Health

## 2024-06-14 ENCOUNTER — Ambulatory Visit (INDEPENDENT_AMBULATORY_CARE_PROVIDER_SITE_OTHER): Admitting: Adult Health

## 2024-06-14 VITALS — BP 130/80 | HR 79 | Temp 98.5°F | Ht 67.0 in | Wt 190.0 lb

## 2024-06-14 DIAGNOSIS — R051 Acute cough: Secondary | ICD-10-CM | POA: Diagnosis not present

## 2024-06-14 MED ORDER — HYDROCODONE BIT-HOMATROP MBR 5-1.5 MG/5ML PO SOLN: 5.0000 mL | Freq: Three times a day (TID) | ORAL | 0 refills | Status: DC | PRN

## 2024-06-14 NOTE — Patient Instructions (Signed)
 It was great seeing you today  I am still waiting for the chest xray to come back   Use a humidifier at your bedside when you go to bed

## 2024-06-14 NOTE — Progress Notes (Addendum)
 Subjective:    Patient ID: Paul Schwartz, male    DOB: Oct 25, 1941, 83 y.o.   MRN: [id number]  Cough   83 year old male who  has a past medical history of Arthritis, Bilateral shoulder pain, BPH (benign prostatic hyperplasia), GERD (gastroesophageal reflux disease), and History of kidney stones.  He presents to the office today for follow up regarding a cough.He was originally seen he was seen about two weeks ago for an acute issue of a productive cough with white sputum and x 3 to 4 days.  At this time he denied any fevers, chills, sinus pain, pressure, or feeling acutely ill.  Chest x-ray was ordered but he left before this was done, he was started on doxycycline , prednisone  20 mg x 7 days and was given Tessalon  Perles.  Despite these measures his cough has continued.At home he has been using Delsym without improvement.   He was then seen about a week ago and the same issues, he was prescribed codeine  cough syrup and the next day had his chest xray done but this has not been read yet.   Today he reports that his cough is slightly worse.  He did not feel like the codeine  cough syrup worked well. He denies any other symptoms such as acid reflux, SOB, wheezing, chest pain, or feeling acutely ill.     Review of Systems  Respiratory:  Positive for cough.    See HPI   Past Medical History:  Diagnosis Date   Arthritis    Bilateral shoulder pain    BPH (benign prostatic hyperplasia)    GERD (gastroesophageal reflux disease)    History of kidney stones     Social History   Socioeconomic History   Marital status: Married    Spouse name: Not on file   Number of children: Not on file   Years of education: Not on file   Highest education level: Not on file  Occupational History   Not on file  Tobacco Use   Smoking status: Former    Current packs/day: 0.00    Average packs/day: 1 pack/day for 10.0 years (10.0 ttl pk-yrs)    Types: Cigarettes    Start date: 04/07/1958    Quit  date: 04/07/1968    Years since quitting: 56.2   Smokeless tobacco: Never  Vaping Use   Vaping status: Never Used  Substance and Sexual Activity   Alcohol use: Yes    Alcohol/week: 4.0 standard drinks of alcohol    Types: 4 Glasses of wine per week    Comment: weekends   Drug use: No   Sexual activity: Yes  Other Topics Concern   Not on file  Social History Narrative   Retired    Married - 50 years    One son that lives in [place]    2 grandchildren    Social Drivers of Health   Financial Resource Strain: Low Risk  (03/14/2022)   Overall Financial Resource Strain (CARDIA)    Difficulty of Paying Living Expenses: Not hard at all  Food Insecurity: No Food Insecurity (03/14/2022)   Hunger Vital Sign    Worried About Running Out of Food in the Last Year: Never true    Ran Out of Food in the Last Year: Never true  Transportation Needs: No Transportation Needs (03/14/2022)   PRAPARE - Administrator, Civil Service (Medical): No    Lack of Transportation (Non-Medical): No  Physical Activity: Insufficiently Active (03/14/2022)  Exercise Vital Sign    Days of Exercise per Week: 2 days    Minutes of Exercise per Session: 60 min  Stress: No Stress Concern Present (03/14/2022)   [employer] of Occupational Health - Occupational Stress Questionnaire    Feeling of Stress : Not at all  Social Connections: Socially Integrated (03/14/2022)   Social Connection and Isolation Panel    Frequency of Communication with Friends and Family: More than three times a week    Frequency of Social Gatherings with Friends and Family: More than three times a week    Attends Religious Services: More than 4 times per year    Active Member of [employer] or Organizations: Yes    Attends Banker Meetings: More than 4 times per year    Marital Status: Married  Catering manager Violence: Not At Risk (03/14/2022)   Humiliation, Afraid, Rape, and Kick questionnaire    Fear of Current or  Ex-Partner: No    Emotionally Abused: No    Physically Abused: No    Sexually Abused: No    Past Surgical History:  Procedure Laterality Date   achilles tendon Right    surgical repair bilat   CATARACT EXTRACTION, BILATERAL     COLONOSCOPY     REVERSE SHOULDER ARTHROPLASTY Right 08/19/2018   Procedure: REVERSE RIGHT SHOULDER ARTHROPLASTY;  Surgeon: Melita Drivers, MD;  Location: [facility];  Service: Orthopedics;  Laterality: Right;    REVERSE SHOULDER ARTHROPLASTY Left 04/10/2022   Procedure: REVERSE SHOULDER ARTHROPLASTY;  Surgeon: Melita Drivers, MD;  Location: [facility];  Service: Orthopedics;  Laterality: Left;     Family History  Problem Relation Age of Onset   Heart disease Father    Colon cancer Neg Hx    Esophageal cancer Neg Hx    Stomach cancer Neg Hx    Rectal cancer Neg Hx     Allergies  Allergen Reactions   Plasticized Base [Plastibase] Hives and Rash    Current Outpatient Medications on File Prior to Visit  Medication Sig Dispense Refill   benzonatate  (TESSALON ) 200 MG capsule Take 1 capsule (200 mg total) by mouth 3 (three) times daily as needed. 30 capsule 0   buPROPion  (WELLBUTRIN  XL) 150 MG 24 hr tablet TAKE 1 TABLET BY MOUTH EVERY DAY 90 tablet 1   citalopram  (CELEXA ) 20 MG tablet Take 1 tablet (20 mg total) by mouth daily. 90 tablet 1   diclofenac  Sodium (VOLTAREN ) 1 % GEL Apply 1 application. topically 4 (four) times daily as needed (pain).     donepezil  (ARICEPT ) 10 MG tablet TAKE 1 TABLET BY MOUTH EVERYDAY AT BEDTIME 90 tablet 3   doxycycline  (VIBRAMYCIN ) 100 MG capsule Take 1 capsule (100 mg total) by mouth 2 (two) times daily. 14 capsule 0   guaiFENesin -codeine  100-10 MG/5ML syrup Take 10 mLs by mouth every 6 (six) hours as needed for cough. 120 mL 0   memantine  (NAMENDA ) 10 MG tablet TAKE 1 TABLET BY MOUTH TWICE A DAY 180 tablet 3   Omega-3 Fatty Acids (FISH OIL TRIPLE STRENGTH) 1400 MG CAPS Take 1,400 mg by mouth daily.     predniSONE   (DELTASONE ) 20 MG tablet Take 1 tablet (20 mg total) by mouth daily with breakfast. 7 tablet 0   simvastatin  (ZOCOR ) 5 MG tablet TAKE 1 TABLET (5 MG TOTAL) BY MOUTH DAILY. 90 tablet 3   No current facility-administered medications on file prior to visit.    BP 130/80   Pulse 79  Temp 98.5 F (36.9 C) (Oral)   Ht 5' 7 (1.702 m)   Wt 190 lb (86.2 kg)   SpO2 95%   BMI 29.76 kg/m       Objective:   Physical Exam Vitals and nursing note reviewed.  Constitutional:      Appearance: Normal appearance.  Cardiovascular:     Rate and Rhythm: Normal rate and regular rhythm.     Pulses: Normal pulses.     Heart sounds: Normal heart sounds.  Pulmonary:     Effort: Pulmonary effort is normal.     Breath sounds: Normal breath sounds. No stridor. No wheezing, rhonchi or rales.  Abdominal:     General: Abdomen is flat. Bowel sounds are normal. There is no distension.     Palpations: Abdomen is soft.     Tenderness: There is no abdominal tenderness.  Skin:    General: Skin is warm and dry.     Capillary Refill: Capillary refill takes less than 2 seconds.  Neurological:     General: No focal deficit present.     Mental Status: He is alert and oriented to person, place, and time.  Psychiatric:        Behavior: Behavior normal.        Thought Content: Thought content normal.        Judgment: Judgment normal.        Assessment & Plan:  1. Acute cough (Primary) - Hopefully his xray results today or tomorrow. No signs of pneumonia. Will send in Hycodan. He was advised of sedating effects of this medication  - HYDROcodone  bit-homatropine (HYCODAN) 5-1.5 MG/5ML syrup; Take 5 mLs by mouth every 8 (eight) hours as needed for cough.  Dispense: 120 mL; Refill: 0  Darleene Shape, NP

## 2024-06-21 ENCOUNTER — Encounter: Payer: Self-pay | Admitting: Adult Health

## 2024-06-21 ENCOUNTER — Ambulatory Visit: Payer: Self-pay | Admitting: Adult Health

## 2024-06-21 ENCOUNTER — Ambulatory Visit: Admitting: Adult Health

## 2024-06-21 VITALS — BP 120/82 | HR 93 | Temp 98.1°F | Ht 67.0 in | Wt 191.0 lb

## 2024-06-21 DIAGNOSIS — R058 Other specified cough: Secondary | ICD-10-CM

## 2024-06-21 MED ORDER — FLUTICASONE PROPIONATE 50 MCG/ACT NA SUSP: 2.0000 | Freq: Every day | NASAL | 6 refills | Status: AC

## 2024-06-21 MED ORDER — GUAIFENESIN ER 600 MG PO TB12: 1200.0000 mg | ORAL_TABLET | Freq: Two times a day (BID) | ORAL | 0 refills | Status: AC

## 2024-06-21 NOTE — Progress Notes (Signed)
 Subjective:    Patient ID: Paul Schwartz, male    DOB: 11/23/1940, 83 y.o.   MRN: [id number]  Cough   83 year old male who  has a past medical history of Arthritis, Bilateral shoulder pain, BPH (benign prostatic hyperplasia), GERD (gastroesophageal reflux disease), and History of kidney stones.  He presents to the office today for follow up regarding a cough. He was originally seen he was seen about three weeks ago for an acute issue of a productive cough with white sputum and x 3 to 4 days.  At this time he denied any fevers, chills, sinus pain, pressure, or feeling acutely ill.  Chest x-ray was ordered but he left before this was done, he was started on doxycycline , prednisone  20 mg x 7 days and was given Tessalon  Perles.  Despite these measures his cough has continued.At home he has been using Delsym without improvement.   His xray did not show any signs of acute infection.   He denies sinus pain or pressure, rhinorrhea, fevers or chills.     Review of Systems  Respiratory:  Positive for cough.    See HPI   Past Medical History:  Diagnosis Date   Arthritis    Bilateral shoulder pain    BPH (benign prostatic hyperplasia)    GERD (gastroesophageal reflux disease)    History of kidney stones     Social History   Socioeconomic History   Marital status: Married    Spouse name: Not on file   Number of children: Not on file   Years of education: Not on file   Highest education level: Not on file  Occupational History   Not on file  Tobacco Use   Smoking status: Former    Current packs/day: 0.00    Average packs/day: 1 pack/day for 10.0 years (10.0 ttl pk-yrs)    Types: Cigarettes    Start date: 04/07/1958    Quit date: 04/07/1968    Years since quitting: 56.2   Smokeless tobacco: Never  Vaping Use   Vaping status: Never Used  Substance and Sexual Activity   Alcohol use: Yes    Alcohol/week: 4.0 standard drinks of alcohol    Types: 4 Glasses of wine per week     Comment: weekends   Drug use: No   Sexual activity: Yes  Other Topics Concern   Not on file  Social History Narrative   Retired    Married - 50 years    One son that lives in [place]    2 grandchildren    Social Drivers of Health   Financial Resource Strain: Low Risk  (03/14/2022)   Overall Financial Resource Strain (CARDIA)    Difficulty of Paying Living Expenses: Not hard at all  Food Insecurity: No Food Insecurity (03/14/2022)   Hunger Vital Sign    Worried About Running Out of Food in the Last Year: Never true    Ran Out of Food in the Last Year: Never true  Transportation Needs: No Transportation Needs (03/14/2022)   PRAPARE - Administrator, Civil Service (Medical): No    Lack of Transportation (Non-Medical): No  Physical Activity: Insufficiently Active (03/14/2022)   Exercise Vital Sign    Days of Exercise per Week: 2 days    Minutes of Exercise per Session: 60 min  Stress: No Stress Concern Present (03/14/2022)   [employer] of Occupational Health - Occupational Stress Questionnaire    Feeling of Stress : Not at all  Social Connections: Socially Integrated (03/14/2022)   Social Connection and Isolation Panel    Frequency of Communication with Friends and Family: More than three times a week    Frequency of Social Gatherings with Friends and Family: More than three times a week    Attends Religious Services: More than 4 times per year    Active Member of [employer] or Organizations: Yes    Attends Banker Meetings: More than 4 times per year    Marital Status: Married  Catering manager Violence: Not At Risk (03/14/2022)   Humiliation, Afraid, Rape, and Kick questionnaire    Fear of Current or Ex-Partner: No    Emotionally Abused: No    Physically Abused: No    Sexually Abused: No    Past Surgical History:  Procedure Laterality Date   achilles tendon Right    surgical repair bilat   CATARACT EXTRACTION, BILATERAL     COLONOSCOPY     REVERSE  SHOULDER ARTHROPLASTY Right 08/19/2018   Procedure: REVERSE RIGHT SHOULDER ARTHROPLASTY;  Surgeon: Melita Drivers, MD;  Location: [facility];  Service: Orthopedics;  Laterality: Right;    REVERSE SHOULDER ARTHROPLASTY Left 04/10/2022   Procedure: REVERSE SHOULDER ARTHROPLASTY;  Surgeon: Melita Drivers, MD;  Location: [facility];  Service: Orthopedics;  Laterality: Left;     Family History  Problem Relation Age of Onset   Heart disease Father    Colon cancer Neg Hx    Esophageal cancer Neg Hx    Stomach cancer Neg Hx    Rectal cancer Neg Hx     Allergies  Allergen Reactions   Plasticized Base [Plastibase] Hives and Rash    Current Outpatient Medications on File Prior to Visit  Medication Sig Dispense Refill   buPROPion  (WELLBUTRIN  XL) 150 MG 24 hr tablet TAKE 1 TABLET BY MOUTH EVERY DAY 90 tablet 1   citalopram  (CELEXA ) 20 MG tablet Take 1 tablet (20 mg total) by mouth daily. 90 tablet 1   diclofenac  Sodium (VOLTAREN ) 1 % GEL Apply 1 application. topically 4 (four) times daily as needed (pain).     donepezil  (ARICEPT ) 10 MG tablet TAKE 1 TABLET BY MOUTH EVERYDAY AT BEDTIME 90 tablet 3   HYDROcodone  bit-homatropine (HYCODAN) 5-1.5 MG/5ML syrup Take 5 mLs by mouth every 8 (eight) hours as needed for cough. 120 mL 0   memantine  (NAMENDA ) 10 MG tablet TAKE 1 TABLET BY MOUTH TWICE A DAY 180 tablet 3   Omega-3 Fatty Acids (FISH OIL TRIPLE STRENGTH) 1400 MG CAPS Take 1,400 mg by mouth daily.     simvastatin  (ZOCOR ) 5 MG tablet TAKE 1 TABLET (5 MG TOTAL) BY MOUTH DAILY. 90 tablet 3   No current facility-administered medications on file prior to visit.    BP 120/82   Pulse 93   Temp 98.1 F (36.7 C) (Oral)   Ht 5' 7 (1.702 m)   Wt 191 lb (86.6 kg)   SpO2 97%   BMI 29.91 kg/m       Objective:   Physical Exam Vitals and nursing note reviewed.  Constitutional:      Appearance: Normal appearance.  HENT:     Mouth/Throat:     Mouth: Mucous membranes are moist.     Pharynx:  Oropharynx is clear. No oropharyngeal exudate or posterior oropharyngeal erythema.  Cardiovascular:     Rate and Rhythm: Normal rate and regular rhythm.     Pulses: Normal pulses.     Heart sounds: Normal heart sounds.  Pulmonary:     Effort: Pulmonary effort is normal. No respiratory distress.     Breath sounds: Normal breath sounds. No stridor. No wheezing or rhonchi.  Musculoskeletal:        General: Normal range of motion.  Skin:    General: Skin is warm and dry.  Neurological:     General: No focal deficit present.     Mental Status: He is alert and oriented to person, place, and time.  Psychiatric:        Mood and Affect: Mood normal.        Behavior: Behavior normal.        Thought Content: Thought content normal.        Judgment: Judgment normal.        Assessment & Plan:  1. Post-viral cough syndrome (Primary) - Updated on xray results likely viral in nature and will take time to pass. Will send in Mucinex  and flonase  to see if this helps. Can consider CT chest if cough continues for the next few weeks  - guaiFENesin  (MUCINEX ) 600 MG 12 hr tablet; Take 2 tablets (1,200 mg total) by mouth 2 (two) times daily.  Dispense: 120 tablet; Refill: 0 - fluticasone  (FLONASE ) 50 MCG/ACT nasal spray; Place 2 sprays into both nostrils daily.  Dispense: 16 g; Refill: 6   Darleene Shape, NP

## 2024-06-28 ENCOUNTER — Encounter: Payer: Self-pay | Admitting: Adult Health

## 2024-06-28 ENCOUNTER — Ambulatory Visit (INDEPENDENT_AMBULATORY_CARE_PROVIDER_SITE_OTHER): Admitting: Adult Health

## 2024-06-28 VITALS — BP 120/86 | HR 91 | Temp 98.1°F | Wt 191.0 lb

## 2024-06-28 DIAGNOSIS — R051 Acute cough: Secondary | ICD-10-CM | POA: Diagnosis not present

## 2024-06-28 MED ORDER — HYDROCODONE BIT-HOMATROP MBR 5-1.5 MG/5ML PO SOLN: 5.0000 mL | Freq: Three times a day (TID) | ORAL | 0 refills | Status: DC | PRN

## 2024-06-28 MED ORDER — PREDNISONE 10 MG PO TABS: ORAL_TABLET | ORAL | 0 refills | Status: DC

## 2024-06-28 NOTE — Patient Instructions (Signed)
 I think this is a viral cough.   I am going to order a CT Chest to make sure, someone will call you to schedule this   I have also sent in more cough medication to take at night and another round of prednisone  to take in the morning.

## 2024-06-28 NOTE — Progress Notes (Signed)
 Subjective:    Patient ID: Paul Schwartz, male    DOB: Oct 26, 1941, 84 y.o.   MRN: [id number]  HPI 83 year old male who  has a past medical history of Arthritis, Bilateral shoulder pain, BPH (benign prostatic hyperplasia), GERD (gastroesophageal reflux disease), and History of kidney stones.  He presents to the office today for continued non productive cough. He was last seen six days ago for this issue but has been ongoing for about a month. . He has had a chest xray done that was completely normal. He has been prescribed a prednisone  taper, doxycycline  and multiple cough syrups without much improvement.   Today he feels as though this cough is the same, continues to be dry. He denies fevers, chills, shortness of breath or feeling ill.    Review of Systems See HPI   Past Medical History:  Diagnosis Date   Arthritis    Bilateral shoulder pain    BPH (benign prostatic hyperplasia)    GERD (gastroesophageal reflux disease)    History of kidney stones     Social History   Socioeconomic History   Marital status: Married    Spouse name: Not on file   Number of children: Not on file   Years of education: Not on file   Highest education level: Not on file  Occupational History   Not on file  Tobacco Use   Smoking status: Former    Current packs/day: 0.00    Average packs/day: 1 pack/day for 10.0 years (10.0 ttl pk-yrs)    Types: Cigarettes    Start date: 04/07/1958    Quit date: 04/07/1968    Years since quitting: 56.2   Smokeless tobacco: Never  Vaping Use   Vaping status: Never Used  Substance and Sexual Activity   Alcohol use: Yes    Alcohol/week: 4.0 standard drinks of alcohol    Types: 4 Glasses of wine per week    Comment: weekends   Drug use: No   Sexual activity: Yes  Other Topics Concern   Not on file  Social History Narrative   Retired    Married - 50 years    One son that lives in [place]    2 grandchildren    Social Drivers of Health   Financial  Resource Strain: Low Risk  (03/14/2022)   Overall Financial Resource Strain (CARDIA)    Difficulty of Paying Living Expenses: Not hard at all  Food Insecurity: No Food Insecurity (03/14/2022)   Hunger Vital Sign    Worried About Running Out of Food in the Last Year: Never true    Ran Out of Food in the Last Year: Never true  Transportation Needs: No Transportation Needs (03/14/2022)   PRAPARE - Administrator, Civil Service (Medical): No    Lack of Transportation (Non-Medical): No  Physical Activity: Insufficiently Active (03/14/2022)   Exercise Vital Sign    Days of Exercise per Week: 2 days    Minutes of Exercise per Session: 60 min  Stress: No Stress Concern Present (03/14/2022)   [employer] of Occupational Health - Occupational Stress Questionnaire    Feeling of Stress : Not at all  Social Connections: Socially Integrated (03/14/2022)   Social Connection and Isolation Panel    Frequency of Communication with Friends and Family: More than three times a week    Frequency of Social Gatherings with Friends and Family: More than three times a week    Attends Religious Services: More than  4 times per year    Active Member of Clubs or Organizations: Yes    Attends Banker Meetings: More than 4 times per year    Marital Status: Married  Catering manager Violence: Not At Risk (03/14/2022)   Humiliation, Afraid, Rape, and Kick questionnaire    Fear of Current or Ex-Partner: No    Emotionally Abused: No    Physically Abused: No    Sexually Abused: No    Past Surgical History:  Procedure Laterality Date   achilles tendon Right    surgical repair bilat   CATARACT EXTRACTION, BILATERAL     COLONOSCOPY     REVERSE SHOULDER ARTHROPLASTY Right 08/19/2018   Procedure: REVERSE RIGHT SHOULDER ARTHROPLASTY;  Surgeon: Melita Drivers, MD;  Location: [facility];  Service: Orthopedics;  Laterality: Right;    REVERSE SHOULDER ARTHROPLASTY Left 04/10/2022   Procedure: REVERSE  SHOULDER ARTHROPLASTY;  Surgeon: Melita Drivers, MD;  Location: [facility];  Service: Orthopedics;  Laterality: Left;     Family History  Problem Relation Age of Onset   Heart disease Father    Colon cancer Neg Hx    Esophageal cancer Neg Hx    Stomach cancer Neg Hx    Rectal cancer Neg Hx     Allergies  Allergen Reactions   Plasticized Base [Plastibase] Hives and Rash    Current Outpatient Medications on File Prior to Visit  Medication Sig Dispense Refill   buPROPion  (WELLBUTRIN  XL) 150 MG 24 hr tablet TAKE 1 TABLET BY MOUTH EVERY DAY 90 tablet 1   citalopram  (CELEXA ) 20 MG tablet Take 1 tablet (20 mg total) by mouth daily. 90 tablet 1   diclofenac  Sodium (VOLTAREN ) 1 % GEL Apply 1 application. topically 4 (four) times daily as needed (pain).     donepezil  (ARICEPT ) 10 MG tablet TAKE 1 TABLET BY MOUTH EVERYDAY AT BEDTIME 90 tablet 3   fluticasone  (FLONASE ) 50 MCG/ACT nasal spray Place 2 sprays into both nostrils daily. 16 g 6   guaiFENesin  (MUCINEX ) 600 MG 12 hr tablet Take 2 tablets (1,200 mg total) by mouth 2 (two) times daily. 120 tablet 0   memantine  (NAMENDA ) 10 MG tablet TAKE 1 TABLET BY MOUTH TWICE A DAY 180 tablet 3   Omega-3 Fatty Acids (FISH OIL TRIPLE STRENGTH) 1400 MG CAPS Take 1,400 mg by mouth daily.     simvastatin  (ZOCOR ) 5 MG tablet TAKE 1 TABLET (5 MG TOTAL) BY MOUTH DAILY. 90 tablet 3   No current facility-administered medications on file prior to visit.    BP 120/86   Pulse 91   Temp 98.1 F (36.7 C)   Wt 191 lb (86.6 kg)   SpO2 96%   BMI 29.91 kg/m       Objective:   Physical Exam Vitals and nursing note reviewed.  Constitutional:      Appearance: Normal appearance.  Cardiovascular:     Rate and Rhythm: Normal rate and regular rhythm.     Pulses: Normal pulses.     Heart sounds: Normal heart sounds.  Pulmonary:     Effort: Pulmonary effort is normal.     Breath sounds: Normal breath sounds.  Musculoskeletal:        General: Normal range  of motion.  Skin:    General: Skin is warm and dry.     Capillary Refill: Capillary refill takes less than 2 seconds.  Neurological:     General: No focal deficit present.     Mental Status:  He is alert and oriented to person, place, and time.  Psychiatric:        Mood and Affect: Mood normal.        Behavior: Behavior normal.        Thought Content: Thought content normal.        Judgment: Judgment normal.        Assessment & Plan:  1. Acute cough (Primary) - Will order CT chest due to persistent nature.  - Will trial him on an additional round of prednisone  and cough syrup - predniSONE  (DELTASONE ) 10 MG tablet; 40 mg x 3 days, 20 mg x 3 days, 10 mg x 3 days  Dispense: 21 tablet; Refill: 0 - HYDROcodone  bit-homatropine (HYCODAN) 5-1.5 MG/5ML syrup; Take 5 mLs by mouth every 8 (eight) hours as needed for cough.  Dispense: 120 mL; Refill: 0 - CT Chest Wo Contrast; Future  Darleene Shape, NP

## 2024-07-04 ENCOUNTER — Ambulatory Visit: Payer: Self-pay

## 2024-07-04 NOTE — Telephone Encounter (Signed)
 FYI Only or Action Required?: FYI only for provider.  Patient was last seen in primary care on 06/28/2024 by Merna Huxley, NP.  Called Nurse Triage reporting Depression.  Symptoms began several months ago.  Interventions attempted: Nothing.  Symptoms are: gradually worsening.  Triage Disposition: See PCP When Office is Open (Within 3 Days)  Patient/caregiver understands and will follow disposition?: Yes     Copied from CRM [id number]. Topic: Clinical - Red Word Triage >> Jul 04, 2024 11:09 AM Paul Schwartz: [facility] that prompted transfer to Nurse Triage:  Patient is feeling symptoms of depression - Tired/Sad/Doesn't want to do anything (Odd Behavior) - Not active at all - No Usual Reason for Disposition  Requesting to talk with a counselor (mental health worker, psychiatrist, etc.)  Answer Assessment - Initial Assessment Questions Patient's wife states he has been watching tv all day long lately. Patient has not had interest in walking outside like usual and states he is always tired. Symptoms ongoing for a few months. Wife states the patient does not have many friends, they have died. This RN gave the wife information about the 24/7 behavioral health clinic in [place].   1. CONCERN: What happened that made you call today?     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 Wife states she is concerned.  2. DEPRESSION SYMPTOM SCREENING: How are you feeling overall? (e.g., decreased energy, increased sleeping or difficulty sleeping, difficulty concentrating, feelings of sadness, guilt, hopelessness, or worthlessness)     Decreased energy  3. RISK OF HARM - SUICIDAL IDEATION:  Do you ever have thoughts of hurting or killing yourself?  (e.g., yes, no, no but preoccupation with thoughts about death)     No 4. RISK OF HARM - HOMICIDAL IDEATION:  Do you ever have thoughts of hurting or killing someone else?   (e.g., yes, no, no but preoccupation with thoughts about death)     No 5. FUNCTIONAL IMPAIRMENT: How have things been going for you overall? Have you had more difficulty than usual doing your normal daily activities?  (e.g., better, same, worse; self-care, school, work, interactions)     Still doing normal activities involving self care.  6. SUPPORT: Who is with you now? Who do you live with? Do you have family or friends who you can talk to?      Has his wife mostly.  7. THERAPIST: Do you have a counselor or therapist? If Yes, ask: What is their name?     No 8. STRESSORS: Has there been any new stress or recent changes in your life?     No 9. ALCOHOL USE OR SUBSTANCE USE (DRUG USE): Do you drink alcohol or use any illegal drugs?     No  10. OTHER: Do you have any other physical symptoms right now? (e.g., fever)       No  Protocols used: Depression-A-AH

## 2024-07-06 ENCOUNTER — Ambulatory Visit (INDEPENDENT_AMBULATORY_CARE_PROVIDER_SITE_OTHER): Admitting: Adult Health

## 2024-07-06 ENCOUNTER — Encounter: Payer: Self-pay | Admitting: Adult Health

## 2024-07-06 VITALS — BP 120/82 | HR 106 | Temp 98.1°F | Ht 67.0 in | Wt 190.0 lb

## 2024-07-06 DIAGNOSIS — F32A Depression, unspecified: Secondary | ICD-10-CM | POA: Diagnosis not present

## 2024-07-06 DIAGNOSIS — F419 Anxiety disorder, unspecified: Secondary | ICD-10-CM | POA: Diagnosis not present

## 2024-07-06 DIAGNOSIS — R051 Acute cough: Secondary | ICD-10-CM

## 2024-07-06 MED ORDER — HYDROCODONE BIT-HOMATROP MBR 5-1.5 MG/5ML PO SOLN: 5.0000 mL | Freq: Three times a day (TID) | ORAL | 0 refills | Status: DC | PRN

## 2024-07-06 MED ORDER — CITALOPRAM HYDROBROMIDE 20 MG PO TABS: 20.0000 mg | ORAL_TABLET | Freq: Every day | ORAL | 1 refills | Status: AC

## 2024-07-06 NOTE — Progress Notes (Signed)
 Subjective:    Patient ID: Paul Schwartz, male    DOB: 1941-08-04, 83 y.o.   MRN: [id number]  Depression        83 year old male who   has a past medical history of Arthritis, Bilateral shoulder pain, BPH (benign prostatic hyperplasia), GERD (gastroesophageal reflux disease), and History of kidney stones.  He presents to the office today for follow up regarding multiple issues  Anxiety and Depression - He is currently managed with Wellbutrin  150 mg ER and Celexa  20 mg daily. He reports  feeling good. He is in need of a refill for Celexa .   He also reports that his acute cough is improving but would like to have a refill of the cough syrup I sent in last week.    Review of Systems  Psychiatric/Behavioral:  Positive for depression.    See HPI   Past Medical History:  Diagnosis Date   Arthritis    Bilateral shoulder pain    BPH (benign prostatic hyperplasia)    GERD (gastroesophageal reflux disease)    History of kidney stones     Social History   Socioeconomic History   Marital status: Married    Spouse name: Not on file   Number of children: Not on file   Years of education: Not on file   Highest education level: Not on file  Occupational History   Not on file  Tobacco Use   Smoking status: Former    Current packs/day: 0.00    Average packs/day: 1 pack/day for 10.0 years (10.0 ttl pk-yrs)    Types: Cigarettes    Start date: 04/07/1958    Quit date: 04/07/1968    Years since quitting: 56.2   Smokeless tobacco: Never  Vaping Use   Vaping status: Never Used  Substance and Sexual Activity   Alcohol use: Yes    Alcohol/week: 4.0 standard drinks of alcohol    Types: 4 Glasses of wine per week    Comment: weekends   Drug use: No   Sexual activity: Yes  Other Topics Concern   Not on file  Social History Narrative   Retired    Married - 50 years    One son that lives in [place]    2 grandchildren    Social Drivers of Health   Financial Resource Strain:  Low Risk  (03/14/2022)   Overall Financial Resource Strain (CARDIA)    Difficulty of Paying Living Expenses: Not hard at all  Food Insecurity: No Food Insecurity (03/14/2022)   Hunger Vital Sign    Worried About Running Out of Food in the Last Year: Never true    Ran Out of Food in the Last Year: Never true  Transportation Needs: No Transportation Needs (03/14/2022)   PRAPARE - Administrator, Civil Service (Medical): No    Lack of Transportation (Non-Medical): No  Physical Activity: Insufficiently Active (03/14/2022)   Exercise Vital Sign    Days of Exercise per Week: 2 days    Minutes of Exercise per Session: 60 min  Stress: No Stress Concern Present (03/14/2022)   [employer] of Occupational Health - Occupational Stress Questionnaire    Feeling of Stress : Not at all  Social Connections: Socially Integrated (03/14/2022)   Social Connection and Isolation Panel    Frequency of Communication with Friends and Family: More than three times a week    Frequency of Social Gatherings with Friends and Family: More than three times a week  Attends Religious Services: More than 4 times per year    Active Member of Clubs or Organizations: Yes    Attends Banker Meetings: More than 4 times per year    Marital Status: Married  Catering manager Violence: Not At Risk (03/14/2022)   Humiliation, Afraid, Rape, and Kick questionnaire    Fear of Current or Ex-Partner: No    Emotionally Abused: No    Physically Abused: No    Sexually Abused: No    Past Surgical History:  Procedure Laterality Date   achilles tendon Right    surgical repair bilat   CATARACT EXTRACTION, BILATERAL     COLONOSCOPY     REVERSE SHOULDER ARTHROPLASTY Right 08/19/2018   Procedure: REVERSE RIGHT SHOULDER ARTHROPLASTY;  Surgeon: Melita Drivers, MD;  Location: [facility];  Service: Orthopedics;  Laterality: Right;    REVERSE SHOULDER ARTHROPLASTY Left 04/10/2022   Procedure: REVERSE SHOULDER  ARTHROPLASTY;  Surgeon: Melita Drivers, MD;  Location: [facility];  Service: Orthopedics;  Laterality: Left;     Family History  Problem Relation Age of Onset   Heart disease Father    Colon cancer Neg Hx    Esophageal cancer Neg Hx    Stomach cancer Neg Hx    Rectal cancer Neg Hx     Allergies  Allergen Reactions   Plasticized Base [Plastibase] Hives and Rash    Current Outpatient Medications on File Prior to Visit  Medication Sig Dispense Refill   buPROPion  (WELLBUTRIN  XL) 150 MG 24 hr tablet TAKE 1 TABLET BY MOUTH EVERY DAY 90 tablet 1   citalopram  (CELEXA ) 20 MG tablet Take 1 tablet (20 mg total) by mouth daily. 90 tablet 1   diclofenac  Sodium (VOLTAREN ) 1 % GEL Apply 1 application. topically 4 (four) times daily as needed (pain).     donepezil  (ARICEPT ) 10 MG tablet TAKE 1 TABLET BY MOUTH EVERYDAY AT BEDTIME 90 tablet 3   fluticasone  (FLONASE ) 50 MCG/ACT nasal spray Place 2 sprays into both nostrils daily. 16 g 6   guaiFENesin  (MUCINEX ) 600 MG 12 hr tablet Take 2 tablets (1,200 mg total) by mouth 2 (two) times daily. 120 tablet 0   HYDROcodone  bit-homatropine (HYCODAN) 5-1.5 MG/5ML syrup Take 5 mLs by mouth every 8 (eight) hours as needed for cough. 120 mL 0   memantine  (NAMENDA ) 10 MG tablet TAKE 1 TABLET BY MOUTH TWICE A DAY 180 tablet 3   Omega-3 Fatty Acids (FISH OIL TRIPLE STRENGTH) 1400 MG CAPS Take 1,400 mg by mouth daily.     predniSONE  (DELTASONE ) 10 MG tablet 40 mg x 3 days, 20 mg x 3 days, 10 mg x 3 days 21 tablet 0   simvastatin  (ZOCOR ) 5 MG tablet TAKE 1 TABLET (5 MG TOTAL) BY MOUTH DAILY. 90 tablet 3   No current facility-administered medications on file prior to visit.    BP 120/82   Pulse (!) 106   Temp 98.1 F (36.7 C) (Oral)   Ht 5' 7 (1.702 m)   Wt 190 lb (86.2 kg)   SpO2 95%   BMI 29.76 kg/m       Objective:   Physical Exam Vitals and nursing note reviewed.  Constitutional:      Appearance: Normal appearance.  Cardiovascular:     Rate  and Rhythm: Normal rate and regular rhythm.     Pulses: Normal pulses.     Heart sounds: Normal heart sounds.  Pulmonary:     Effort: Pulmonary effort is normal.  Breath sounds: Normal breath sounds.  Skin:    General: Skin is warm and dry.  Neurological:     General: No focal deficit present.     Mental Status: He is alert and oriented to person, place, and time.  Psychiatric:        Mood and Affect: Mood normal.        Behavior: Behavior normal.        Thought Content: Thought content normal.        Assessment & Plan:  1. Anxiety and depression (Primary) *** - citalopram  (CELEXA ) 20 MG tablet; Take 1 tablet (20 mg total) by mouth daily.  Dispense: 90 tablet; Refill: 1  2. Acute cough *** - HYDROcodone  bit-homatropine (HYCODAN) 5-1.5 MG/5ML syrup; Take 5 mLs by mouth every 8 (eight) hours as needed for cough.  Dispense: 120 mL; Refill: 0

## 2024-08-01 DIAGNOSIS — H6123 Impacted cerumen, bilateral: Secondary | ICD-10-CM | POA: Diagnosis not present

## 2024-08-01 DIAGNOSIS — H93293 Other abnormal auditory perceptions, bilateral: Secondary | ICD-10-CM | POA: Diagnosis not present

## 2024-11-15 ENCOUNTER — Ambulatory Visit (INDEPENDENT_AMBULATORY_CARE_PROVIDER_SITE_OTHER): Admitting: Adult Health

## 2024-11-15 ENCOUNTER — Encounter: Payer: Self-pay | Admitting: Adult Health

## 2024-11-15 VITALS — BP 120/80 | HR 94 | Temp 98.6°F | Ht 67.0 in | Wt 190.0 lb

## 2024-11-15 DIAGNOSIS — M545 Low back pain, unspecified: Secondary | ICD-10-CM | POA: Diagnosis not present

## 2024-11-15 NOTE — Patient Instructions (Addendum)
 I think your back pain is either muscular or arthritis.  Try using a heating pad on your low back and do some stretching.   Let me know if this does not help over the next few weeks

## 2024-11-15 NOTE — Progress Notes (Signed)
 "  Subjective:    Patient ID: Paul Schwartz, male    DOB: 03/12/1941, 84 y.o.   MRN: [id number]  HPI Discussed the use of AI scribe software for clinical note transcription with the patient, who gave verbal consent to proceed.  History of Present Illness   Paul Schwartz is an 84 year old male who presents with back pain.  He has had a sore back for 1 to 2 weeks, localized to the back without radiation to the legs. Pain and stiffness are worse in the morning and are absent while walking. He denies urinary symptoms and has not used medication for pain. He remains physically active. Pain is not severe.        Review of Systems See HPI   Past Medical History:  Diagnosis Date   Arthritis    Bilateral shoulder pain    BPH (benign prostatic hyperplasia)    GERD (gastroesophageal reflux disease)    History of kidney stones     Social History   Socioeconomic History   Marital status: Married    Spouse name: Not on file   Number of children: Not on file   Years of education: Not on file   Highest education level: Not on file  Occupational History   Not on file  Tobacco Use   Smoking status: Former    Current packs/day: 0.00    Average packs/day: 1 pack/day for 10.0 years (10.0 ttl pk-yrs)    Types: Cigarettes    Start date: 04/07/1958    Quit date: 04/07/1968    Years since quitting: 56.6   Smokeless tobacco: Never  Vaping Use   Vaping status: Never Used  Substance and Sexual Activity   Alcohol use: Yes    Alcohol/week: 4.0 standard drinks of alcohol    Types: 4 Glasses of wine per week    Comment: weekends   Drug use: No   Sexual activity: Yes  Other Topics Concern   Not on file  Social History Narrative   Retired    Married - 50 years    One son that lives in [place]    2 grandchildren    Social Drivers of Health   Tobacco Use: Medium Risk (11/15/2024)   Patient History    Smoking Tobacco Use: Former    Smokeless Tobacco Use: Never    Passive  Exposure: Not on Actuary Strain: Low Risk (03/14/2022)   Overall Financial Resource Strain (CARDIA)    Difficulty of Paying Living Expenses: Not hard at all  Food Insecurity: No Food Insecurity (03/14/2022)   Hunger Vital Sign    Worried About Running Out of Food in the Last Year: Never true    Ran Out of Food in the Last Year: Never true  Transportation Needs: No Transportation Needs (03/14/2022)   PRAPARE - Administrator, Civil Service (Medical): No    Lack of Transportation (Non-Medical): No  Physical Activity: Insufficiently Active (03/14/2022)   Exercise Vital Sign    Days of Exercise per Week: 2 days    Minutes of Exercise per Session: 60 min  Stress: No Stress Concern Present (03/14/2022)   [employer] of Occupational Health - Occupational Stress Questionnaire    Feeling of Stress : Not at all  Social Connections: Socially Integrated (03/14/2022)   Social Connection and Isolation Panel    Frequency of Communication with Friends and Family: More than three times a week    Frequency of Social Gatherings  with Friends and Family: More than three times a week    Attends Religious Services: More than 4 times per year    Active Member of Clubs or Organizations: Yes    Attends Banker Meetings: More than 4 times per year    Marital Status: Married  Catering Manager Violence: Not At Risk (03/14/2022)   Humiliation, Afraid, Rape, and Kick questionnaire    Fear of Current or Ex-Partner: No    Emotionally Abused: No    Physically Abused: No    Sexually Abused: No  Depression (PHQ2-9): Low Risk (11/15/2024)   Depression (PHQ2-9)    PHQ-2 Score: 0  Alcohol Screen: Low Risk (03/14/2022)   Alcohol Screen    Last Alcohol Screening Score (AUDIT): 2  Housing: Low Risk (03/14/2022)   Housing    Last Housing Risk Score: 0  Utilities: Not on file  Health Literacy: Not on file    Past Surgical History:  Procedure Laterality Date   achilles tendon Right     surgical repair bilat   CATARACT EXTRACTION, BILATERAL     COLONOSCOPY     REVERSE SHOULDER ARTHROPLASTY Right 08/19/2018   Procedure: REVERSE RIGHT SHOULDER ARTHROPLASTY;  Surgeon: Melita Drivers, MD;  Location: [facility];  Service: Orthopedics;  Laterality: Right;    REVERSE SHOULDER ARTHROPLASTY Left 04/10/2022   Procedure: REVERSE SHOULDER ARTHROPLASTY;  Surgeon: Melita Drivers, MD;  Location: [facility];  Service: Orthopedics;  Laterality: Left;     Family History  Problem Relation Age of Onset   Heart disease Father    Colon cancer Neg Hx    Esophageal cancer Neg Hx    Stomach cancer Neg Hx    Rectal cancer Neg Hx     Allergies[1]  Medications Ordered Prior to Encounter[2]  BP 120/80   Pulse 94   Temp 98.6 F (37 C) (Oral)   Ht 5' 7 (1.702 m)   Wt 190 lb (86.2 kg)   SpO2 95%   BMI 29.76 kg/m       Objective:   Physical Exam Vitals and nursing note reviewed.  Constitutional:      Appearance: Normal appearance.  Cardiovascular:     Rate and Rhythm: Regular rhythm.     Pulses: Normal pulses.     Heart sounds: Normal heart sounds.  Pulmonary:     Effort: Pulmonary effort is normal.     Breath sounds: Normal breath sounds.  Musculoskeletal:        General: Tenderness present. Normal range of motion.     Lumbar back: Swelling and tenderness present. No deformity, spasms or bony tenderness. Normal range of motion. Negative right straight leg raise test and negative left straight leg raise test. No scoliosis.       Back:  Skin:    General: Skin is warm and dry.  Neurological:     General: No focal deficit present.     Mental Status: He is alert and oriented to person, place, and time.  Psychiatric:        Mood and Affect: Mood normal.        Behavior: Behavior normal.        Thought Content: Thought content normal.        Judgment: Judgment normal.        Assessment & Plan:  Assessment and Plan    Acute low back pain Likely musculoskeletal,  related to physical activity. Localized pain without leg radiation or urinary symptoms. Pain not severe. -  Use heating pad for pain relief. - Perform back stretching exercises. - Provided printed stretching exercises      Darleene Shape, NP      [1]  Allergies Allergen Reactions   Plasticized Base [Plastibase] Hives and Rash  [2]  Current Outpatient Medications on File Prior to Visit  Medication Sig Dispense Refill   buPROPion  (WELLBUTRIN  XL) 150 MG 24 hr tablet TAKE 1 TABLET BY MOUTH EVERY DAY 90 tablet 1   citalopram  (CELEXA ) 20 MG tablet Take 1 tablet (20 mg total) by mouth daily. 90 tablet 1   diclofenac  Sodium (VOLTAREN ) 1 % GEL Apply 1 application. topically 4 (four) times daily as needed (pain).     donepezil  (ARICEPT ) 10 MG tablet TAKE 1 TABLET BY MOUTH EVERYDAY AT BEDTIME 90 tablet 3   fluticasone  (FLONASE ) 50 MCG/ACT nasal spray Place 2 sprays into both nostrils daily. 16 g 6   memantine  (NAMENDA ) 10 MG tablet TAKE 1 TABLET BY MOUTH TWICE A DAY 180 tablet 3   Omega-3 Fatty Acids (FISH OIL TRIPLE STRENGTH) 1400 MG CAPS Take 1,400 mg by mouth daily.     simvastatin  (ZOCOR ) 5 MG tablet TAKE 1 TABLET (5 MG TOTAL) BY MOUTH DAILY. 90 tablet 3   No current facility-administered medications on file prior to visit.   "

## 2024-11-30 ENCOUNTER — Other Ambulatory Visit: Payer: Self-pay | Admitting: Adult Health
